# Patient Record
Sex: Female | Born: 1945
Health system: Southern US, Community
[De-identification: ages and names within clinical notes are randomized; demographics above are authoritative.]

## PROBLEM LIST (undated history)

## (undated) DIAGNOSIS — K649 Unspecified hemorrhoids: Secondary | ICD-10-CM

## (undated) DIAGNOSIS — C801 Malignant (primary) neoplasm, unspecified: Secondary | ICD-10-CM

## (undated) DIAGNOSIS — E119 Type 2 diabetes mellitus without complications: Secondary | ICD-10-CM

## (undated) DIAGNOSIS — C3490 Malignant neoplasm of unspecified part of unspecified bronchus or lung: Secondary | ICD-10-CM

## (undated) DIAGNOSIS — I639 Cerebral infarction, unspecified: Secondary | ICD-10-CM

## (undated) DIAGNOSIS — C7951 Secondary malignant neoplasm of bone: Secondary | ICD-10-CM

## (undated) DIAGNOSIS — Z780 Asymptomatic menopausal state: Secondary | ICD-10-CM

## (undated) DIAGNOSIS — E785 Hyperlipidemia, unspecified: Secondary | ICD-10-CM

## (undated) DIAGNOSIS — D126 Benign neoplasm of colon, unspecified: Secondary | ICD-10-CM

## (undated) DIAGNOSIS — E669 Obesity, unspecified: Secondary | ICD-10-CM

## (undated) DIAGNOSIS — M1990 Unspecified osteoarthritis, unspecified site: Secondary | ICD-10-CM

## (undated) HISTORY — DX: Obesity, unspecified: E66.9

## (undated) HISTORY — DX: Benign neoplasm of colon, unspecified: D12.6

## (undated) HISTORY — DX: Asymptomatic menopausal state: Z78.0

## (undated) HISTORY — DX: Unspecified hemorrhoids: K64.9

## (undated) HISTORY — DX: Hyperlipidemia, unspecified: E78.5

## (undated) HISTORY — DX: Unspecified osteoarthritis, unspecified site: M19.90

## (undated) HISTORY — DX: Type 2 diabetes mellitus without complications: E11.9

---

## 1988-01-09 HISTORY — PX: HYSTERECTOMY ABDOMINAL WITH SALPINGO-OOPHORECTOMY: SHX6792

## 2001-08-12 ENCOUNTER — Encounter: Payer: Self-pay | Admitting: Family Medicine

## 2001-08-12 ENCOUNTER — Encounter: Admission: RE | Admit: 2001-08-12 | Discharge: 2001-08-12 | Payer: Self-pay | Admitting: Family Medicine

## 2001-10-09 ENCOUNTER — Ambulatory Visit (HOSPITAL_COMMUNITY): Admission: RE | Admit: 2001-10-09 | Discharge: 2001-10-09 | Payer: Self-pay | Admitting: Gastroenterology

## 2001-10-09 ENCOUNTER — Encounter (INDEPENDENT_AMBULATORY_CARE_PROVIDER_SITE_OTHER): Payer: Self-pay | Admitting: Specialist

## 2002-08-10 ENCOUNTER — Encounter: Payer: Self-pay | Admitting: Cardiovascular Disease

## 2002-08-19 ENCOUNTER — Ambulatory Visit (HOSPITAL_COMMUNITY): Admission: RE | Admit: 2002-08-19 | Discharge: 2002-08-19 | Payer: Self-pay | Admitting: Cardiovascular Disease

## 2002-09-01 ENCOUNTER — Encounter: Admission: RE | Admit: 2002-09-01 | Discharge: 2002-11-30 | Payer: Self-pay | Admitting: Family Medicine

## 2003-01-09 HISTORY — PX: SHOULDER SURGERY: SHX246

## 2003-05-28 ENCOUNTER — Emergency Department (HOSPITAL_COMMUNITY): Admission: EM | Admit: 2003-05-28 | Discharge: 2003-05-28 | Payer: Self-pay | Admitting: Emergency Medicine

## 2003-10-12 ENCOUNTER — Ambulatory Visit (HOSPITAL_COMMUNITY): Admission: RE | Admit: 2003-10-12 | Discharge: 2003-10-14 | Payer: Self-pay | Admitting: Orthopedic Surgery

## 2004-09-29 ENCOUNTER — Other Ambulatory Visit: Admission: RE | Admit: 2004-09-29 | Discharge: 2004-09-29 | Payer: Self-pay | Admitting: Family Medicine

## 2005-03-08 ENCOUNTER — Encounter: Admission: RE | Admit: 2005-03-08 | Discharge: 2005-03-08 | Payer: Self-pay | Admitting: Family Medicine

## 2006-04-03 ENCOUNTER — Encounter: Admission: RE | Admit: 2006-04-03 | Discharge: 2006-04-03 | Payer: Self-pay | Admitting: Family Medicine

## 2006-12-12 ENCOUNTER — Encounter: Admission: RE | Admit: 2006-12-12 | Discharge: 2006-12-12 | Payer: Self-pay | Admitting: Family Medicine

## 2007-01-09 HISTORY — PX: KNEE ARTHROSCOPY: SUR90

## 2007-02-13 ENCOUNTER — Encounter: Admission: RE | Admit: 2007-02-13 | Discharge: 2007-02-13 | Payer: Self-pay | Admitting: Family Medicine

## 2007-03-19 ENCOUNTER — Other Ambulatory Visit: Admission: RE | Admit: 2007-03-19 | Discharge: 2007-03-19 | Payer: Self-pay | Admitting: Family Medicine

## 2007-04-04 ENCOUNTER — Encounter: Admission: RE | Admit: 2007-04-04 | Discharge: 2007-04-04 | Payer: Self-pay | Admitting: Family Medicine

## 2008-05-06 ENCOUNTER — Encounter: Admission: RE | Admit: 2008-05-06 | Discharge: 2008-05-06 | Payer: Self-pay | Admitting: Family Medicine

## 2008-10-29 ENCOUNTER — Inpatient Hospital Stay (HOSPITAL_COMMUNITY): Admission: EM | Admit: 2008-10-29 | Discharge: 2008-10-30 | Payer: Self-pay | Admitting: Emergency Medicine

## 2009-05-09 ENCOUNTER — Encounter: Admission: RE | Admit: 2009-05-09 | Discharge: 2009-05-09 | Payer: Self-pay | Admitting: Family Medicine

## 2010-01-06 ENCOUNTER — Inpatient Hospital Stay (HOSPITAL_COMMUNITY)
Admission: EM | Admit: 2010-01-06 | Discharge: 2010-01-10 | Disposition: A | Discharge status: Rehabilitation Facility | Payer: Self-pay | Source: Home / Self Care | Attending: Internal Medicine | Admitting: Internal Medicine

## 2010-01-07 ENCOUNTER — Encounter (INDEPENDENT_AMBULATORY_CARE_PROVIDER_SITE_OTHER): Payer: Self-pay | Admitting: Internal Medicine

## 2010-01-09 ENCOUNTER — Encounter (INDEPENDENT_AMBULATORY_CARE_PROVIDER_SITE_OTHER): Payer: Self-pay | Admitting: Family Medicine

## 2010-01-10 ENCOUNTER — Inpatient Hospital Stay (HOSPITAL_COMMUNITY)
Admission: RE | Admit: 2010-01-10 | Discharge: 2010-01-19 | Payer: Self-pay | Attending: Physical Medicine & Rehabilitation | Admitting: Physical Medicine & Rehabilitation

## 2010-01-10 ENCOUNTER — Other Ambulatory Visit: Payer: Self-pay | Admitting: Family Medicine

## 2010-01-11 LAB — CBC
HCT: 36.5 % (ref 36.0–46.0)
Hemoglobin: 11.9 g/dL — ABNORMAL LOW (ref 12.0–15.0)
MCH: 30.1 pg (ref 26.0–34.0)
MCHC: 32.6 g/dL (ref 30.0–36.0)
MCV: 92.4 fL (ref 78.0–100.0)
Platelets: 227 10*3/uL (ref 150–400)
RBC: 3.95 MIL/uL (ref 3.87–5.11)
RDW: 13 % (ref 11.5–15.5)
WBC: 5.2 10*3/uL (ref 4.0–10.5)

## 2010-01-11 LAB — GLUCOSE, CAPILLARY
Glucose-Capillary: 134 mg/dL — ABNORMAL HIGH (ref 70–99)
Glucose-Capillary: 152 mg/dL — ABNORMAL HIGH (ref 70–99)
Glucose-Capillary: 139 mg/dL — ABNORMAL HIGH (ref 70–99)
Glucose-Capillary: 260 mg/dL — ABNORMAL HIGH (ref 70–99)

## 2010-01-11 LAB — COMPREHENSIVE METABOLIC PANEL
ALT: 11 U/L (ref 0–35)
AST: 16 U/L (ref 0–37)
Albumin: 3.7 g/dL (ref 3.5–5.2)
Alkaline Phosphatase: 51 U/L (ref 39–117)
BUN: 14 mg/dL (ref 6–23)
CO2: 26 mEq/L (ref 19–32)
Calcium: 9.3 mg/dL (ref 8.4–10.5)
Chloride: 107 mEq/L (ref 96–112)
Creatinine, Ser: 0.78 mg/dL (ref 0.4–1.2)
GFR calc Af Amer: >60 mL/min (ref >60)
GFR calc non Af Amer: >60 mL/min (ref >60)
Glucose, Bld: 136 mg/dL — ABNORMAL HIGH (ref 70–99)
Potassium: 4.2 mEq/L (ref 3.5–5.1)
Sodium: 140 mEq/L (ref 135–145)
Total Bilirubin: 0.3 mg/dL (ref 0.3–1.2)
Total Protein: 6.5 g/dL (ref 6.0–8.3)

## 2010-01-12 LAB — GLUCOSE, CAPILLARY
Glucose-Capillary: 180 mg/dL — ABNORMAL HIGH (ref 70–99)
Glucose-Capillary: 133 mg/dL — ABNORMAL HIGH (ref 70–99)
Glucose-Capillary: 153 mg/dL — ABNORMAL HIGH (ref 70–99)
Glucose-Capillary: 193 mg/dL — ABNORMAL HIGH (ref 70–99)

## 2010-01-12 LAB — DIFFERENTIAL
Basophils Absolute: 0 10*3/uL (ref 0.0–0.1)
Basophils Relative: 0 % (ref 0–1)
Eosinophils Absolute: 0.1 10*3/uL (ref 0.0–0.7)
Eosinophils Relative: 3 % (ref 0–5)
Lymphocytes Relative: 39 % (ref 12–46)
Lymphs Abs: 2 10*3/uL (ref 0.7–4.0)
Monocytes Absolute: 0.4 10*3/uL (ref 0.1–1.0)
Monocytes Relative: 8 % (ref 3–12)
Neutro Abs: 2.6 10*3/uL (ref 1.7–7.7)
Neutrophils Relative %: 50 % (ref 43–77)

## 2010-01-13 LAB — GLUCOSE, CAPILLARY
Glucose-Capillary: 128 mg/dL — ABNORMAL HIGH (ref 70–99)
Glucose-Capillary: 91 mg/dL (ref 70–99)

## 2010-01-23 LAB — GLUCOSE, CAPILLARY
Glucose-Capillary: 263 mg/dL — ABNORMAL HIGH (ref 70–99)
Glucose-Capillary: 139 mg/dL — ABNORMAL HIGH (ref 70–99)
Glucose-Capillary: 251 mg/dL — ABNORMAL HIGH (ref 70–99)
Glucose-Capillary: 239 mg/dL — ABNORMAL HIGH (ref 70–99)
Glucose-Capillary: 158 mg/dL — ABNORMAL HIGH (ref 70–99)
Glucose-Capillary: 191 mg/dL — ABNORMAL HIGH (ref 70–99)
Glucose-Capillary: 148 mg/dL — ABNORMAL HIGH (ref 70–99)
Glucose-Capillary: 151 mg/dL — ABNORMAL HIGH (ref 70–99)
Glucose-Capillary: 86 mg/dL (ref 70–99)
Glucose-Capillary: 131 mg/dL — ABNORMAL HIGH (ref 70–99)
Glucose-Capillary: 128 mg/dL — ABNORMAL HIGH (ref 70–99)
Glucose-Capillary: 129 mg/dL — ABNORMAL HIGH (ref 70–99)
Glucose-Capillary: 107 mg/dL — ABNORMAL HIGH (ref 70–99)
Glucose-Capillary: 125 mg/dL — ABNORMAL HIGH (ref 70–99)
Glucose-Capillary: 170 mg/dL — ABNORMAL HIGH (ref 70–99)
Glucose-Capillary: 119 mg/dL — ABNORMAL HIGH (ref 70–99)
Glucose-Capillary: 172 mg/dL — ABNORMAL HIGH (ref 70–99)
Glucose-Capillary: 114 mg/dL — ABNORMAL HIGH (ref 70–99)
Glucose-Capillary: 127 mg/dL — ABNORMAL HIGH (ref 70–99)
Glucose-Capillary: 123 mg/dL — ABNORMAL HIGH (ref 70–99)
Glucose-Capillary: 115 mg/dL — ABNORMAL HIGH (ref 70–99)
Glucose-Capillary: 101 mg/dL — ABNORMAL HIGH (ref 70–99)
Glucose-Capillary: 213 mg/dL — ABNORMAL HIGH (ref 70–99)
Glucose-Capillary: 141 mg/dL — ABNORMAL HIGH (ref 70–99)

## 2010-01-30 ENCOUNTER — Encounter
Admission: RE | Admit: 2010-01-30 | Discharge: 2010-02-07 | Payer: Self-pay | Source: Home / Self Care | Attending: Physical Medicine & Rehabilitation | Admitting: Physical Medicine & Rehabilitation

## 2010-02-08 ENCOUNTER — Ambulatory Visit: Payer: Medicare Other | Attending: Physical Medicine & Rehabilitation | Admitting: Occupational Therapy

## 2010-02-08 ENCOUNTER — Ambulatory Visit: Payer: Medicare Other | Admitting: Physical Therapy

## 2010-02-08 DIAGNOSIS — R279 Unspecified lack of coordination: Secondary | ICD-10-CM | POA: Insufficient documentation

## 2010-02-08 DIAGNOSIS — Z5189 Encounter for other specified aftercare: Secondary | ICD-10-CM | POA: Insufficient documentation

## 2010-02-08 DIAGNOSIS — R269 Unspecified abnormalities of gait and mobility: Secondary | ICD-10-CM | POA: Insufficient documentation

## 2010-02-08 DIAGNOSIS — I69998 Other sequelae following unspecified cerebrovascular disease: Secondary | ICD-10-CM | POA: Insufficient documentation

## 2010-02-08 DIAGNOSIS — M6281 Muscle weakness (generalized): Secondary | ICD-10-CM | POA: Insufficient documentation

## 2010-02-10 ENCOUNTER — Ambulatory Visit: Payer: Medicare Other | Admitting: Physical Therapy

## 2010-02-10 ENCOUNTER — Ambulatory Visit: Payer: Medicare Other | Admitting: Occupational Therapy

## 2010-02-13 ENCOUNTER — Ambulatory Visit: Payer: Medicare Other | Admitting: Physical Therapy

## 2010-02-13 ENCOUNTER — Ambulatory Visit: Payer: Medicare Other | Admitting: Occupational Therapy

## 2010-02-15 ENCOUNTER — Ambulatory Visit: Payer: Medicare Other | Admitting: Physical Therapy

## 2010-02-15 ENCOUNTER — Ambulatory Visit: Payer: Medicare Other | Admitting: Occupational Therapy

## 2010-02-17 ENCOUNTER — Encounter: Payer: Medicare Other | Attending: Physical Medicine & Rehabilitation

## 2010-02-17 ENCOUNTER — Inpatient Hospital Stay: Payer: Medicare Other | Admitting: Physical Medicine & Rehabilitation

## 2010-02-17 DIAGNOSIS — E119 Type 2 diabetes mellitus without complications: Secondary | ICD-10-CM | POA: Insufficient documentation

## 2010-02-17 DIAGNOSIS — I6330 Cerebral infarction due to thrombosis of unspecified cerebral artery: Secondary | ICD-10-CM

## 2010-02-17 DIAGNOSIS — I69993 Ataxia following unspecified cerebrovascular disease: Secondary | ICD-10-CM | POA: Insufficient documentation

## 2010-02-17 DIAGNOSIS — R209 Unspecified disturbances of skin sensation: Secondary | ICD-10-CM

## 2010-02-17 DIAGNOSIS — G8110 Spastic hemiplegia affecting unspecified side: Secondary | ICD-10-CM

## 2010-02-17 DIAGNOSIS — R279 Unspecified lack of coordination: Secondary | ICD-10-CM | POA: Insufficient documentation

## 2010-02-17 DIAGNOSIS — I69959 Hemiplegia and hemiparesis following unspecified cerebrovascular disease affecting unspecified side: Secondary | ICD-10-CM | POA: Insufficient documentation

## 2010-02-17 DIAGNOSIS — I10 Essential (primary) hypertension: Secondary | ICD-10-CM | POA: Insufficient documentation

## 2010-02-17 DIAGNOSIS — R29898 Other symptoms and signs involving the musculoskeletal system: Secondary | ICD-10-CM | POA: Insufficient documentation

## 2010-02-17 DIAGNOSIS — I69998 Other sequelae following unspecified cerebrovascular disease: Secondary | ICD-10-CM | POA: Insufficient documentation

## 2010-02-21 ENCOUNTER — Ambulatory Visit: Payer: Medicare Other | Admitting: Occupational Therapy

## 2010-02-21 ENCOUNTER — Ambulatory Visit: Payer: Medicare Other | Admitting: Physical Therapy

## 2010-02-24 ENCOUNTER — Ambulatory Visit: Payer: Medicare Other | Admitting: Occupational Therapy

## 2010-02-24 ENCOUNTER — Ambulatory Visit: Payer: Medicare Other | Admitting: Physical Therapy

## 2010-02-27 ENCOUNTER — Ambulatory Visit: Payer: Medicare Other | Admitting: Physical Therapy

## 2010-02-27 ENCOUNTER — Ambulatory Visit: Payer: Medicare Other | Admitting: Occupational Therapy

## 2010-03-01 ENCOUNTER — Ambulatory Visit: Payer: Medicare Other | Admitting: Physical Therapy

## 2010-03-01 ENCOUNTER — Ambulatory Visit: Payer: Medicare Other | Admitting: Occupational Therapy

## 2010-03-20 LAB — URINALYSIS, MICROSCOPIC ONLY
Bilirubin Urine: NEGATIVE
Glucose, UA: NEGATIVE mg/dL
Hgb urine dipstick: NEGATIVE
Ketones, ur: NEGATIVE mg/dL
Nitrite: NEGATIVE
Protein, ur: NEGATIVE mg/dL
Specific Gravity, Urine: 1.024 (ref 1.005–1.030)
Urobilinogen, UA: 0.2 mg/dL (ref 0.0–1.0)
pH: 5.5 (ref 5.0–8.0)

## 2010-03-20 LAB — CARDIAC PANEL(CRET KIN+CKTOT+MB+TROPI)
CK, MB: 1.1 ng/mL (ref 0.3–4.0)
CK, MB: 1.1 ng/mL (ref 0.3–4.0)
Total CK: 55 U/L (ref 7–177)
Total CK: 62 U/L (ref 7–177)
Troponin I: 0.01 ng/mL (ref 0.00–0.06)

## 2010-03-20 LAB — CBC
MCHC: 33.8 g/dL (ref 30.0–36.0)
MCV: 91.8 fL (ref 78.0–100.0)
RBC: 3.8 MIL/uL — ABNORMAL LOW (ref 3.87–5.11)
RDW: 12.5 % (ref 11.5–15.5)
WBC: 8.2 10*3/uL (ref 4.0–10.5)

## 2010-03-20 LAB — HEMOGLOBIN A1C
Hgb A1c MFr Bld: 9 % — ABNORMAL HIGH (ref <5.7)
Mean Plasma Glucose: 212 mg/dL — ABNORMAL HIGH (ref <117)

## 2010-03-20 LAB — URINALYSIS, ROUTINE W REFLEX MICROSCOPIC
Bilirubin Urine: NEGATIVE
Glucose, UA: NEGATIVE mg/dL
Hgb urine dipstick: NEGATIVE
Nitrite: NEGATIVE
pH: 5.5 (ref 5.0–8.0)

## 2010-03-20 LAB — GLUCOSE, CAPILLARY
Glucose-Capillary: 132 mg/dL — ABNORMAL HIGH (ref 70–99)
Glucose-Capillary: 159 mg/dL — ABNORMAL HIGH (ref 70–99)
Glucose-Capillary: 107 mg/dL — ABNORMAL HIGH (ref 70–99)
Glucose-Capillary: 111 mg/dL — ABNORMAL HIGH (ref 70–99)
Glucose-Capillary: 114 mg/dL — ABNORMAL HIGH (ref 70–99)
Glucose-Capillary: 126 mg/dL — ABNORMAL HIGH (ref 70–99)
Glucose-Capillary: 142 mg/dL — ABNORMAL HIGH (ref 70–99)
Glucose-Capillary: 158 mg/dL — ABNORMAL HIGH (ref 70–99)
Glucose-Capillary: 164 mg/dL — ABNORMAL HIGH (ref 70–99)
Glucose-Capillary: 187 mg/dL — ABNORMAL HIGH (ref 70–99)

## 2010-03-20 LAB — URINE CULTURE
Colony Count: >=100000
Culture  Setup Time: 201201031820

## 2010-03-20 LAB — COMPREHENSIVE METABOLIC PANEL
BUN: 14 mg/dL (ref 6–23)
Calcium: 10 mg/dL (ref 8.4–10.5)
Chloride: 103 mEq/L (ref 96–112)
Glucose, Bld: 222 mg/dL — ABNORMAL HIGH (ref 70–99)
Potassium: 4.2 mEq/L (ref 3.5–5.1)

## 2010-03-20 LAB — DIFFERENTIAL
Basophils Absolute: 0 10*3/uL (ref 0.0–0.1)
Monocytes Absolute: 0.5 10*3/uL (ref 0.1–1.0)
Monocytes Relative: 6 % (ref 3–12)

## 2010-03-20 LAB — LIPID PANEL
HDL: 32 mg/dL — ABNORMAL LOW (ref >39)
LDL Cholesterol: 75 mg/dL (ref 0–99)
Total CHOL/HDL Ratio: 4.6 RATIO
Triglycerides: 194 mg/dL — ABNORMAL HIGH (ref <150)

## 2010-03-20 LAB — APTT: aPTT: 32 seconds (ref 24–37)

## 2010-03-20 LAB — CK TOTAL AND CKMB (NOT AT ARMC): CK, MB: 1.4 ng/mL (ref 0.3–4.0)

## 2010-03-20 LAB — PROTIME-INR: Prothrombin Time: 13.7 seconds (ref 11.6–15.2)

## 2010-03-21 ENCOUNTER — Encounter: Payer: Medicare Other | Attending: Physical Medicine & Rehabilitation

## 2010-03-21 ENCOUNTER — Ambulatory Visit: Payer: Medicare Other | Admitting: Physical Medicine & Rehabilitation

## 2010-03-21 DIAGNOSIS — G5610 Other lesions of median nerve, unspecified upper limb: Secondary | ICD-10-CM

## 2010-03-21 DIAGNOSIS — R209 Unspecified disturbances of skin sensation: Secondary | ICD-10-CM | POA: Insufficient documentation

## 2010-03-21 DIAGNOSIS — I69998 Other sequelae following unspecified cerebrovascular disease: Secondary | ICD-10-CM | POA: Insufficient documentation

## 2010-03-21 DIAGNOSIS — E119 Type 2 diabetes mellitus without complications: Secondary | ICD-10-CM | POA: Insufficient documentation

## 2010-03-21 DIAGNOSIS — I69959 Hemiplegia and hemiparesis following unspecified cerebrovascular disease affecting unspecified side: Secondary | ICD-10-CM | POA: Insufficient documentation

## 2010-03-21 DIAGNOSIS — I69993 Ataxia following unspecified cerebrovascular disease: Secondary | ICD-10-CM | POA: Insufficient documentation

## 2010-03-21 DIAGNOSIS — R29898 Other symptoms and signs involving the musculoskeletal system: Secondary | ICD-10-CM | POA: Insufficient documentation

## 2010-03-21 DIAGNOSIS — R279 Unspecified lack of coordination: Secondary | ICD-10-CM | POA: Insufficient documentation

## 2010-03-21 DIAGNOSIS — I10 Essential (primary) hypertension: Secondary | ICD-10-CM | POA: Insufficient documentation

## 2010-04-13 LAB — COMPREHENSIVE METABOLIC PANEL
ALT: 22 U/L (ref 0–35)
Albumin: 4 g/dL (ref 3.5–5.2)
Alkaline Phosphatase: 75 U/L (ref 39–117)
BUN: 11 mg/dL (ref 6–23)
Chloride: 96 mEq/L (ref 96–112)
Glucose, Bld: 236 mg/dL — ABNORMAL HIGH (ref 70–99)
Potassium: 3.6 mEq/L (ref 3.5–5.1)
Sodium: 131 mEq/L — ABNORMAL LOW (ref 135–145)
Total Bilirubin: 0.8 mg/dL (ref 0.3–1.2)
Total Protein: 7.1 g/dL (ref 6.0–8.3)

## 2010-04-13 LAB — CK TOTAL AND CKMB (NOT AT ARMC): CK, MB: 1.2 ng/mL (ref 0.3–4.0)

## 2010-04-13 LAB — DIFFERENTIAL
Lymphs Abs: 1.9 10*3/uL (ref 0.7–4.0)
Monocytes Relative: 6 % (ref 3–12)
Neutro Abs: 3.3 10*3/uL (ref 1.7–7.7)
Neutrophils Relative %: 59 % (ref 43–77)

## 2010-04-13 LAB — CARDIAC PANEL(CRET KIN+CKTOT+MB+TROPI)
CK, MB: 1.3 ng/mL (ref 0.3–4.0)
Total CK: 80 U/L (ref 7–177)

## 2010-04-13 LAB — POCT I-STAT, CHEM 8
BUN: 11 mg/dL (ref 6–23)
Chloride: 104 mEq/L (ref 96–112)
Creatinine, Ser: 0.5 mg/dL (ref 0.4–1.2)
Glucose, Bld: 267 mg/dL — ABNORMAL HIGH (ref 70–99)
Potassium: 4.1 mEq/L (ref 3.5–5.1)

## 2010-04-13 LAB — POCT CARDIAC MARKERS
CKMB, poc: 1.2 ng/mL (ref 1.0–8.0)
CKMB, poc: <1 ng/mL — ABNORMAL LOW (ref 1.0–8.0)
Myoglobin, poc: 59.2 ng/mL (ref 12–200)
Troponin i, poc: <0.05 ng/mL (ref 0.00–0.09)

## 2010-04-13 LAB — MAGNESIUM: Magnesium: 1.8 mg/dL (ref 1.5–2.5)

## 2010-04-13 LAB — CBC
HCT: 38.5 % (ref 36.0–46.0)
Hemoglobin: 12.9 g/dL (ref 12.0–15.0)
Hemoglobin: 13.5 g/dL (ref 12.0–15.0)
Platelets: 239 10*3/uL (ref 150–400)
RBC: 3.99 MIL/uL (ref 3.87–5.11)
RDW: 12.5 % (ref 11.5–15.5)
WBC: 5.7 10*3/uL (ref 4.0–10.5)
WBC: 6.3 10*3/uL (ref 4.0–10.5)

## 2010-04-13 LAB — HEMOGLOBIN A1C: Hgb A1c MFr Bld: 11.3 % — ABNORMAL HIGH (ref 4.6–6.1)

## 2010-04-13 LAB — BRAIN NATRIURETIC PEPTIDE: Pro B Natriuretic peptide (BNP): <30 pg/mL (ref 0.0–100.0)

## 2010-04-13 LAB — LIPID PANEL
LDL Cholesterol: UNDETERMINED mg/dL (ref 0–99)
Triglycerides: 561 mg/dL — ABNORMAL HIGH (ref <150)
VLDL: UNDETERMINED mg/dL (ref 0–40)

## 2010-04-13 LAB — GLUCOSE, CAPILLARY
Glucose-Capillary: 177 mg/dL — ABNORMAL HIGH (ref 70–99)
Glucose-Capillary: 237 mg/dL — ABNORMAL HIGH (ref 70–99)

## 2010-04-14 ENCOUNTER — Other Ambulatory Visit: Payer: Self-pay | Admitting: Family Medicine

## 2010-04-14 DIAGNOSIS — Z1231 Encounter for screening mammogram for malignant neoplasm of breast: Secondary | ICD-10-CM

## 2010-04-21 ENCOUNTER — Encounter: Payer: Medicare Other | Attending: Physical Medicine & Rehabilitation

## 2010-04-21 ENCOUNTER — Ambulatory Visit: Payer: Medicare Other | Admitting: Physical Medicine & Rehabilitation

## 2010-04-21 DIAGNOSIS — R279 Unspecified lack of coordination: Secondary | ICD-10-CM | POA: Insufficient documentation

## 2010-04-21 DIAGNOSIS — I69959 Hemiplegia and hemiparesis following unspecified cerebrovascular disease affecting unspecified side: Secondary | ICD-10-CM | POA: Insufficient documentation

## 2010-04-21 DIAGNOSIS — R29898 Other symptoms and signs involving the musculoskeletal system: Secondary | ICD-10-CM | POA: Insufficient documentation

## 2010-04-21 DIAGNOSIS — I69993 Ataxia following unspecified cerebrovascular disease: Secondary | ICD-10-CM | POA: Insufficient documentation

## 2010-04-21 DIAGNOSIS — G5610 Other lesions of median nerve, unspecified upper limb: Secondary | ICD-10-CM

## 2010-04-21 DIAGNOSIS — I69998 Other sequelae following unspecified cerebrovascular disease: Secondary | ICD-10-CM | POA: Insufficient documentation

## 2010-04-21 DIAGNOSIS — R209 Unspecified disturbances of skin sensation: Secondary | ICD-10-CM | POA: Insufficient documentation

## 2010-04-21 DIAGNOSIS — I6330 Cerebral infarction due to thrombosis of unspecified cerebral artery: Secondary | ICD-10-CM

## 2010-04-21 DIAGNOSIS — M19029 Primary osteoarthritis, unspecified elbow: Secondary | ICD-10-CM

## 2010-04-21 DIAGNOSIS — E119 Type 2 diabetes mellitus without complications: Secondary | ICD-10-CM | POA: Insufficient documentation

## 2010-04-21 DIAGNOSIS — I10 Essential (primary) hypertension: Secondary | ICD-10-CM | POA: Insufficient documentation

## 2010-04-21 NOTE — Assessment & Plan Note (Signed)
REASON FOR VISIT:  Pain in right shoulder, weakness in right side from stroke, and numbness in hands.  HISTORY OF PRESENT ILLNESS:  This is a 65 year old female with prior left pontine stroke causing right hemiparesis onset January 07, 2010. She was an inpatient at [facility] from January 10, 2009, to January 19, 2009, went through the outpatient neuro rehab program for PT and OT. She has done relatively well in terms of her stroke.  She is back into an independent lifestyle.  She was complaining of numbness in the hands waking her up at night.  She underwent EMG and CV showing a moderate median nerve compressive neuropathy at the wrist bilaterally.  She was advised wrist splints and these symptoms have improved.  Also noted was an ulnar neuropathy at the elbow bilaterally, but this appeared to be mild rather than moderate.  She still has a right shoulder pain, sees Dr. Dorene Grebe for this.  She has had no falls.  No other medical complications in the interval time. We discussed recommendations against NSAIDS due to history of stroke.  SOCIAL HISTORY:  Married, lives with her spouse.  PHYSICAL EXAMINATION:  Blood pressure 120/75, pulse 82, respirations 18, and O2 sat 99% on room air.  Orientation x3.  Affect alert.  Gait is normal.  No evidence of toe drag or knee instability.  Phalen's is negative bilaterally.  Tinel's negative at the wrist and elbow bilaterally.  Sensation mildly reduced at right index finger bilaterally compared to the fifth digits.  Shoulder has pain over the [facility] area with adduction.  IMPRESSION: 1. Cerebrovascular accident of left pontine with essential resolution     of right hemiparesis. 2. Median neuropathy of the wrist, improved after wrist splinting. 3. Right shoulder pain, probable acromioclavicular joint versus     subacromial bursitis.  PLAN: 1. Wrist splints p.r.n. hand tingling.  She is no longer wearing them     at the current time. 2. Dr. August Saucer  is to address shoulder pain as he has done. 3. I will see the patient back in 2 months.  She has also been     reminded to keep up with her home exercise program for her stroke     rehabilitation exercises.     Erick Colace, M.D. Electronically Signed    AEK/MedQ D:  04/21/2010 09:47:02  T:  04/21/2010 23:24:37  Job #:  [id number]  cc:   G. Dorene Grebe, M.D. Fax: [phone]

## 2010-05-11 ENCOUNTER — Ambulatory Visit
Admission: RE | Admit: 2010-05-11 | Discharge: 2010-05-11 | Disposition: A | Discharge status: Home / Self Care | Payer: Medicare Other | Source: Ambulatory Visit | Attending: Family Medicine | Admitting: Family Medicine

## 2010-05-11 DIAGNOSIS — Z1231 Encounter for screening mammogram for malignant neoplasm of breast: Secondary | ICD-10-CM

## 2010-05-26 NOTE — Cardiovascular Report (Signed)
   NAME:  Elizabeth Calhoun, Elizabeth Calhoun                      ACCOUNT NO.:  [id number]   MEDICAL RECORD NO.:  [id number]                   PATIENT TYPE:  OIB   LOCATION:  2899                                 FACILITY:  [facility]   PHYSICIAN:  Vesta Mixer, M.D.              DATE OF BIRTH:  1945-04-26   DATE OF PROCEDURE:  08/19/2002  DATE OF DISCHARGE:                              CARDIAC CATHETERIZATION   PROCEDURE:  Left heart catheterization with coronary angiography.   INDICATIONS:  Ms. Hukill is a 65 year old female with a history of chest  pains.  She was referred for a stress Cardiolite study which revealed an  anterior apical defect.  She was scheduled for a heart catheterization for  further evaluation.   PROCEDURE:  The right femoral artery was easily cannulated using a modified  Seldinger technique.   HEMODYNAMICS:  The left ventricular pressure was 151/10 with an aortic  pressure of 150/77.   ANGIOGRAPHY:  The left main coronary artery is smooth and normal.   The left anterior descending artery has minor luminal irregularities.  The  first diagonal vessel is a fairly large vessel with a 10-20% stenosis in the  proximal aspect.   The left circumflex artery is a moderate sized vessel and is normal.   The right coronary artery is large and dominant.  There are minor luminal  irregularities in the proximal and mid segment.  The distal RCA has a focal  20-25% stenosis.  The posterior descending artery and the posterolateral  segment arteries are normal.   LEFT VENTRICULOGRAM:  The left ventriculogram was performed in a 30 RAO  position.  It reveals normal left ventricular systolic function with an  ejection fraction of 70%.  There is no mitral regurgitation.   COMPLICATIONS:  None.    CONCLUSION:  1. Minimal coronary artery irregularities.  I suspect that her episodes of     chest pain are noncardiac in origin.  2. Hypertension.  She is currently on no medical therapy for  this, although     she may need to be started on some antihypertensives in the near future.     We will follow up with this as an outpatient.                                               Vesta Mixer, M.D.    PJN/MEDQ  D:  08/19/2002  T:  08/19/2002  Job:  [id number]   cc:   Chales Salmon. Abigail Miyamoto, M.D.  [address]  [place]  [place] [address]  Fax: [phone]

## 2010-05-26 NOTE — Op Note (Signed)
NAME:  Elizabeth Calhoun, Elizabeth Calhoun            ACCOUNT NO.:  [id number]   MEDICAL RECORD NO.:  [id number]          PATIENT TYPE:  OIB   LOCATION:  5029                         FACILITY:  [facility]   PHYSICIAN:  Burnard Bunting, M.D.    DATE OF BIRTH:  1945-03-08   DATE OF PROCEDURE:  10/12/2003  DATE OF DISCHARGE:                                 OPERATIVE REPORT   PREOPERATIVE DIAGNOSIS:  Left shoulder flap tear with biceps degeneration,  synovitis, rotator cuff tear, and impingement bursitis.   POSTOPERATIVE DIAGNOSIS:  Left shoulder flap tear with biceps degeneration,  synovitis, rotator cuff tear, and impingement bursitis.   PROCEDURE:  Left shoulder diagnostic operative arthroscopy with extensive  debridement of synovitis and labral degeneration, with release of  degenerated biceps tendon and subsequent biceps tenodesis, subacromial  decompression, and mini open rotator cuff repair.   SURGEON:  Burnard Bunting, M.D.   ASSISTANT:  Jerolyn Shin. Tresa Res, M.D..   ANESTHESIA:  General endotracheal plus interscalene block.   ESTIMATED BLOOD LOSS:  25 cc.   DRAINS:  None.   OPERATIVE FINDINGS:  1.  Examination under anesthesia.  Range of motion:  External rotation 50      degrees, abduction is 70 degrees, forward flexion is 180, internal      rotation 90 degrees, abduction 60, external rotation 90 degrees,      abduction is about 100.  Shoulder stability 1+ anterior-posterior, with      less than 1 cm __________.  2.  Diagnostic operative arthroscopy.  3.  Completely frayed and degenerated biceps tendon, with approximately 75%      tearing of the biceps tendon, with early adhesive capsulitis type      synovitis within the biceps anchor and rotator interval.  4.  Intact glenohumeral articular surface.  5.  A 1.5 x 2 cm rotator cuff tear, leading edge of the supraspinatus.  6.  Significant impingement bursitis.  7.  Os acromionale.   PROCEDURE IN DETAIL:  The patient was brought to the operating  room, where  general endotracheal anesthesia was induced.  Preoperative antibiotics had  been administered.  The patient was placed in the beach-chair position, with  the head in neutral position and the right arm well padded.  The left arm,  shoulder, and hand were prepped with Duraprep solution and draped in a  sterile manner.  Topographic anatomy of the shoulder was identified,  including the posterolateral and anterior margins of the acromion, as well  as the location of the [facility] joint and coracoid process.  Twenty cubic  centimeters of saline were then placed into the glenohumeral joint.  Twenty  cubic centimeters of saline and epinephrine solution were then placed into  the subacromial space.  A posterior portal was then created 2 cm medial and  inferior to the posterolateral margin of the acromion.  An anterior portal  was then created under direct visualization.  Diagnostic arthroscopy of the  glenohumeral joint was performed.  Glenohumeral articular surfaces were  intact.  A significant rotator cuff tear was identified which was  approximately 1.5-2 cm x 2 cm.  The biceps tendon itself was significantly  frayed, with extensive synovitis noted within the superior labral region as  well as rotator interval region.  Biceps tendon was released with the  shaver.  Extensive synovitis was extensively debrided.  No loose bodies were  present in the axillary recess.  Infraspinatus attachment was intact.  At  this time, the lateral portal was established, and the scope was placed into  the subacromial space.  Bursectomy and release of the coracoacromial  ligament were performed.  Small spur was resected, although minimal bone  resection was performed due to the patient's os acromionale.  At this time,  instruments were removed from the portals.  The anterior and posterior  portals were closed using 3-0 nylon suture.  Operative field was covered  with Ioban.  An incision was then made along the  shoulder.  Skin and  subcutaneous tissues were sharply divided.  A stay suture was placed in the  middle of the anterior and middle raphes of the deltoid, 3 cm distal to the  anterolateral margin of the acromion.  The deltoid was then split but not  detached.  Rotator cuff tear was visualized.  Rotator cuff tear was repaired  using a combination of bone tunnels made by [facility] device and one 6.5  corkscrew suture anchor.  Margin-convergent sutures were placed, and the  tear was mobilized.  Maximum footprint coverage was achieved using belt and  suspenders technique with the Curvetek and suture anchor.  At this time,  biceps tenodesis was performed.  Transverse humeral ligament was incised.  Biceps tendon was tagged with a  #2 fiber wire suture.  A 7 x 25 mm hole was  then made at the junction of the humeral shaft and humeral head.  Biceps  tenodesis was performed using the Arthrex tenodesis corkscrew 7 x 23 mm.  Excellent fixation was achieved.  Transverse humeral ligament was then  closed using 2-0 fiber wire suture.  Incision was then thoroughly irrigated.  Deltoid was reapproximated using #1 Vicryl suture.  The arm was taken  through a range of motion.  No impingement was noted in the subacromial  space.  Skin was closed using interrupted #2-0 Vicryl and 3-0 Prolene.  The  patient was placed in a bulky dressing and shoulder immobilizer.  She  tolerated the procedure well, without any complications.       GSD/MEDQ  D:  10/13/2003  T:  10/13/2003  Job:  [id number]

## 2010-05-26 NOTE — Op Note (Signed)
   NAME:  Elizabeth Calhoun, Elizabeth Calhoun                      ACCOUNT NO.:  [id number]   MEDICAL RECORD NO.:  [id number]                   PATIENT TYPE:  AMB   LOCATION:  ENDO                                 [facility]ACILITY[facility]   PHYSICIAN:  Petra Kuba, M.D.                 DATE OF BIRTH:  1945-12-12   DATE OF PROCEDURE:  10/09/2001  DATE OF DISCHARGE:                                 OPERATIVE REPORT   PROCEDURE:  Colonoscopy with polypectomy.   INDICATIONS FOR PROCEDURE:  Family history of questionable type of cancer  due for colonic screening. Consent was signed after risks, benefits,  methods, and options were thoroughly discussed in the office.   MEDICINES USED:  Fentanyl 70, Versed 7.   DESCRIPTION OF PROCEDURE:  Rectal inspection was pertinent for external  hemorrhoids. Digital exam was negative. The video pediatric adjustable  colonoscope was inserted, easily advanced around the colon to the cecum.  This did not require any abdominal pressure or any position changes. No  obvious abnormality was seen on insertion. The scope was slowly withdrawn.  The cecum was identified by the appendiceal orifice and the ileocecal valve.  The prep was adequate. There was some liquid stool that required washing and  suctioning. On slow withdrawal through the colon, other than a tiny  descending polyp which was hot biopsied x1, no other abnormalities were  seen. Specifically no other polyps, masses or diverticula. Once back in the  rectum, the scope was retroflexed pertinent for some small internal  hemorrhoids. The scope was straightened and readvanced a short ways up the  left side of the colon, air was suctioned, scope removed. The patient  tolerated the procedure well. There was no obvious or immediate  complication.   ENDOSCOPIC DIAGNOSIS:  1. Internal and external hemorrhoids.  2. Tiny descending polyp hot biopsied.  3. Otherwise within normal limits to the cecum.   PLAN:  Await pathology but  probably recheck colon screening in five years.  Happy to see back p.r.n., otherwise, return care to Dr. Abigail Miyamoto for the  customary health care maintenance to include yearly rectals and guaiacs.                                               Petra Kuba, M.D.    MEM/MEDQ  D:  10/09/2001  T:  10/10/2001  Job:  [id number]   cc:   Chales Salmon. Abigail Miyamoto, M.D.

## 2010-06-19 ENCOUNTER — Ambulatory Visit: Payer: Medicare Other | Admitting: Physical Medicine & Rehabilitation

## 2010-06-19 ENCOUNTER — Encounter: Payer: Medicare Other | Attending: Physical Medicine & Rehabilitation

## 2010-06-19 DIAGNOSIS — I10 Essential (primary) hypertension: Secondary | ICD-10-CM | POA: Insufficient documentation

## 2010-06-19 DIAGNOSIS — I69959 Hemiplegia and hemiparesis following unspecified cerebrovascular disease affecting unspecified side: Secondary | ICD-10-CM | POA: Insufficient documentation

## 2010-06-19 DIAGNOSIS — I6330 Cerebral infarction due to thrombosis of unspecified cerebral artery: Secondary | ICD-10-CM

## 2010-06-19 DIAGNOSIS — E119 Type 2 diabetes mellitus without complications: Secondary | ICD-10-CM | POA: Insufficient documentation

## 2010-06-19 DIAGNOSIS — I69998 Other sequelae following unspecified cerebrovascular disease: Secondary | ICD-10-CM | POA: Insufficient documentation

## 2010-06-19 DIAGNOSIS — I69993 Ataxia following unspecified cerebrovascular disease: Secondary | ICD-10-CM | POA: Insufficient documentation

## 2010-06-19 DIAGNOSIS — R279 Unspecified lack of coordination: Secondary | ICD-10-CM | POA: Insufficient documentation

## 2010-06-19 DIAGNOSIS — R29898 Other symptoms and signs involving the musculoskeletal system: Secondary | ICD-10-CM | POA: Insufficient documentation

## 2010-06-19 DIAGNOSIS — G8110 Spastic hemiplegia affecting unspecified side: Secondary | ICD-10-CM

## 2010-06-19 DIAGNOSIS — R209 Unspecified disturbances of skin sensation: Secondary | ICD-10-CM | POA: Insufficient documentation

## 2010-06-20 NOTE — Assessment & Plan Note (Signed)
REASON FOR VISIT:  Stroke.  A 65 year old female with prior history of CVA.  She also has carpal tunnel syndrome as well as right shoulder pain.  She had left pontine stroke in December of 2011, and went through inpatient as well as outpatient rehabilitation.  She no longer receives any Forensic scientist.  Her EMG on March 21, 2010, showed bilateral median neuropathy at the wrist rated as moderate, but she responded well to splints.  She is seeing Dr. Dorene Grebe, in regard to her shoulder pain.  Pain level is about 5/10.  She is scheduled for another injection with Dr. August Saucer.  SOCIAL HISTORY:  Married, lives with her husband, planning to visit [place] at the end of the month.  PHYSICAL EXAMINATION:  VITAL SIGNS:  Blood pressure 114/52, pulse 63, respirations 18, O2 sat 100% on room air. GENERAL:  No acute distress.  Mood and affect appropriate. MUSCULOSKELETAL:  Her hands have no evidence of sensory deficit to pinprick.  Negative Tinel's at the wrist.  Motor strength is 5/5 bilateral upper and lower extremities.  Gait is normal.  Deep tendon reflexes are 3+ bilateral knees, 2+ bilateral ankles, and 1 at the biceps and triceps bilaterally. There is no dysdiadochokinesis on rapid alternating pronation supination bilateral upper extremities.  IMPRESSION: 1. Left pontine cerebrovascular accident with right hemiparesis which     is essentially resolved at this point. 2. Carpal tunnel syndrome improved with conservative care. 3. Right shoulder pain, followup with Ortho.  PLAN:  I will see her back on a p.r.n. basis certainly if she has some recurrence of a carpal tunnel type symptoms, I can get her in for carpal tunnel injection.  Discussed with patient and husband, agree with plan.     Erick Colace, M.D. Electronically Signed    AEK/MedQ D:  06/19/2010 09:41:43  T:  06/20/2010 00:03:42  Job #:  [id number]  cc:   G. Dorene Grebe, M.D. Fax: [phone]

## 2011-01-31 DIAGNOSIS — I6789 Other cerebrovascular disease: Secondary | ICD-10-CM | POA: Diagnosis not present

## 2011-01-31 DIAGNOSIS — G479 Sleep disorder, unspecified: Secondary | ICD-10-CM | POA: Diagnosis not present

## 2011-01-31 DIAGNOSIS — E7800 Pure hypercholesterolemia, unspecified: Secondary | ICD-10-CM | POA: Diagnosis not present

## 2011-01-31 DIAGNOSIS — I10 Essential (primary) hypertension: Secondary | ICD-10-CM | POA: Diagnosis not present

## 2011-01-31 DIAGNOSIS — E119 Type 2 diabetes mellitus without complications: Secondary | ICD-10-CM | POA: Diagnosis not present

## 2011-04-03 ENCOUNTER — Other Ambulatory Visit: Payer: Self-pay | Admitting: Family Medicine

## 2011-04-03 DIAGNOSIS — Z1231 Encounter for screening mammogram for malignant neoplasm of breast: Secondary | ICD-10-CM

## 2011-05-15 ENCOUNTER — Ambulatory Visit
Admission: RE | Admit: 2011-05-15 | Discharge: 2011-05-15 | Disposition: A | Discharge status: Home / Self Care | Payer: Medicare Other | Source: Ambulatory Visit | Attending: Family Medicine | Admitting: Family Medicine

## 2011-05-15 DIAGNOSIS — Z1231 Encounter for screening mammogram for malignant neoplasm of breast: Secondary | ICD-10-CM

## 2011-06-22 ENCOUNTER — Encounter: Payer: Self-pay | Admitting: *Deleted

## 2011-08-01 DIAGNOSIS — E119 Type 2 diabetes mellitus without complications: Secondary | ICD-10-CM | POA: Diagnosis not present

## 2011-08-01 DIAGNOSIS — I10 Essential (primary) hypertension: Secondary | ICD-10-CM | POA: Diagnosis not present

## 2011-08-01 DIAGNOSIS — G479 Sleep disorder, unspecified: Secondary | ICD-10-CM | POA: Diagnosis not present

## 2011-08-01 DIAGNOSIS — I6789 Other cerebrovascular disease: Secondary | ICD-10-CM | POA: Diagnosis not present

## 2011-08-01 DIAGNOSIS — E7800 Pure hypercholesterolemia, unspecified: Secondary | ICD-10-CM | POA: Diagnosis not present

## 2012-02-01 DIAGNOSIS — G479 Sleep disorder, unspecified: Secondary | ICD-10-CM | POA: Diagnosis not present

## 2012-02-01 DIAGNOSIS — E669 Obesity, unspecified: Secondary | ICD-10-CM | POA: Diagnosis not present

## 2012-02-01 DIAGNOSIS — I10 Essential (primary) hypertension: Secondary | ICD-10-CM | POA: Diagnosis not present

## 2012-02-01 DIAGNOSIS — E119 Type 2 diabetes mellitus without complications: Secondary | ICD-10-CM | POA: Diagnosis not present

## 2012-02-01 DIAGNOSIS — J069 Acute upper respiratory infection, unspecified: Secondary | ICD-10-CM | POA: Diagnosis not present

## 2012-02-01 DIAGNOSIS — E7800 Pure hypercholesterolemia, unspecified: Secondary | ICD-10-CM | POA: Diagnosis not present

## 2012-02-01 DIAGNOSIS — I6789 Other cerebrovascular disease: Secondary | ICD-10-CM | POA: Diagnosis not present

## 2012-03-12 ENCOUNTER — Encounter: Payer: Self-pay | Admitting: Cardiology

## 2012-04-14 ENCOUNTER — Other Ambulatory Visit: Payer: Self-pay

## 2012-04-14 DIAGNOSIS — Z1231 Encounter for screening mammogram for malignant neoplasm of breast: Secondary | ICD-10-CM

## 2012-05-01 DIAGNOSIS — E7800 Pure hypercholesterolemia, unspecified: Secondary | ICD-10-CM | POA: Diagnosis not present

## 2012-05-20 ENCOUNTER — Ambulatory Visit
Admission: RE | Admit: 2012-05-20 | Discharge: 2012-05-20 | Disposition: A | Discharge status: Home / Self Care | Payer: Medicare Other | Source: Ambulatory Visit

## 2012-05-20 DIAGNOSIS — Z1231 Encounter for screening mammogram for malignant neoplasm of breast: Secondary | ICD-10-CM | POA: Diagnosis not present

## 2012-08-01 DIAGNOSIS — E669 Obesity, unspecified: Secondary | ICD-10-CM | POA: Diagnosis not present

## 2012-08-01 DIAGNOSIS — G479 Sleep disorder, unspecified: Secondary | ICD-10-CM | POA: Diagnosis not present

## 2012-08-01 DIAGNOSIS — Z79899 Other long term (current) drug therapy: Secondary | ICD-10-CM | POA: Diagnosis not present

## 2012-08-01 DIAGNOSIS — I6789 Other cerebrovascular disease: Secondary | ICD-10-CM | POA: Diagnosis not present

## 2012-08-01 DIAGNOSIS — E7800 Pure hypercholesterolemia, unspecified: Secondary | ICD-10-CM | POA: Diagnosis not present

## 2012-08-01 DIAGNOSIS — L989 Disorder of the skin and subcutaneous tissue, unspecified: Secondary | ICD-10-CM | POA: Diagnosis not present

## 2012-08-01 DIAGNOSIS — I10 Essential (primary) hypertension: Secondary | ICD-10-CM | POA: Diagnosis not present

## 2012-08-05 ENCOUNTER — Other Ambulatory Visit: Payer: Self-pay | Admitting: Family Medicine

## 2012-08-05 DIAGNOSIS — E2839 Other primary ovarian failure: Secondary | ICD-10-CM

## 2012-08-19 ENCOUNTER — Other Ambulatory Visit: Payer: Medicare Other

## 2012-08-27 DIAGNOSIS — M25519 Pain in unspecified shoulder: Secondary | ICD-10-CM | POA: Diagnosis not present

## 2012-08-28 DIAGNOSIS — H2510 Age-related nuclear cataract, unspecified eye: Secondary | ICD-10-CM | POA: Diagnosis not present

## 2012-08-29 ENCOUNTER — Ambulatory Visit
Admission: RE | Admit: 2012-08-29 | Discharge: 2012-08-29 | Disposition: A | Discharge status: Home / Self Care | Payer: Medicare Other | Source: Ambulatory Visit | Attending: Family Medicine | Admitting: Family Medicine

## 2012-08-29 DIAGNOSIS — M899 Disorder of bone, unspecified: Secondary | ICD-10-CM | POA: Diagnosis not present

## 2012-08-29 DIAGNOSIS — E2839 Other primary ovarian failure: Secondary | ICD-10-CM

## 2012-09-05 DIAGNOSIS — M19019 Primary osteoarthritis, unspecified shoulder: Secondary | ICD-10-CM | POA: Diagnosis not present

## 2012-09-10 DIAGNOSIS — M25519 Pain in unspecified shoulder: Secondary | ICD-10-CM | POA: Diagnosis not present

## 2013-01-23 DIAGNOSIS — R05 Cough: Secondary | ICD-10-CM | POA: Diagnosis not present

## 2013-01-23 DIAGNOSIS — R059 Cough, unspecified: Secondary | ICD-10-CM | POA: Diagnosis not present

## 2013-01-23 DIAGNOSIS — R509 Fever, unspecified: Secondary | ICD-10-CM | POA: Diagnosis not present

## 2013-04-23 ENCOUNTER — Other Ambulatory Visit: Payer: Self-pay

## 2013-04-23 DIAGNOSIS — Z1231 Encounter for screening mammogram for malignant neoplasm of breast: Secondary | ICD-10-CM

## 2013-04-27 DIAGNOSIS — H02409 Unspecified ptosis of unspecified eyelid: Secondary | ICD-10-CM | POA: Diagnosis not present

## 2013-05-07 ENCOUNTER — Encounter (HOSPITAL_COMMUNITY): Payer: Self-pay | Admitting: Emergency Medicine

## 2013-05-07 ENCOUNTER — Emergency Department (INDEPENDENT_AMBULATORY_CARE_PROVIDER_SITE_OTHER)
Admission: EM | Admit: 2013-05-07 | Discharge: 2013-05-07 | Disposition: A | Discharge status: Home / Self Care | Payer: Medicare Other | Source: Home / Self Care | Attending: Emergency Medicine | Admitting: Emergency Medicine

## 2013-05-07 DIAGNOSIS — J301 Allergic rhinitis due to pollen: Secondary | ICD-10-CM

## 2013-05-07 DIAGNOSIS — J309 Allergic rhinitis, unspecified: Secondary | ICD-10-CM

## 2013-05-07 DIAGNOSIS — J029 Acute pharyngitis, unspecified: Secondary | ICD-10-CM | POA: Diagnosis not present

## 2013-05-07 LAB — POCT RAPID STREP A: STREPTOCOCCUS, GROUP A SCREEN (DIRECT): NEGATIVE

## 2013-05-07 MED ORDER — CLINDAMYCIN HCL 300 MG PO CAPS: 300.0000 mg | ORAL_CAPSULE | Freq: Three times a day (TID) | ORAL | Status: DC

## 2013-05-07 MED ORDER — LORATADINE 10 MG PO TABS: 10.0000 mg | ORAL_TABLET | Freq: Every day | ORAL | Status: DC

## 2013-05-07 NOTE — ED Provider Notes (Signed)
CSN: [id number]     Arrival date & time 05/07/13  1123 History   First MD Initiated Contact with Patient 05/07/13 1149     No chief complaint on file.  (Consider location/radiation/quality/duration/timing/severity/associated sxs/prior Treatment) HPI Comments: Patient presents with 7-10 day history of throat discomfort with associated cough, sneezing, and itchy watery eyes. Denies fever. Reports she has been gargling at home with salt water and vinegar and has not had relief. PCP: Dr. Sabra Heck at [facility] Patient is a non-smoker  Patient is a 68 y.o. female presenting with pharyngitis. The history is provided by the patient.  Sore Throat    Past Medical History  Diagnosis Date  . Hemorrhoids   . OA (osteoarthritis)   . Obesity   . Other and unspecified hyperlipidemia   . Menopause   . Adenomatous colon polyp    No past surgical history on file. Family History  Problem Relation Age of Onset  . Heart attack Father   . Heart disease Brother     1/8  . Heart disease Brother     2/8  . Heart disease Brother     3/8  . Heart disease Brother     4/8  . Heart disease Brother     5/8  . Heart disease Brother     6/8  . Cancer Brother     7/8  . Seizures Brother     8/8  . Stroke Brother     8/8  . Heart disease Sister     1/6  . Alzheimer's disease Sister     2/6  . Diabetes Sister     3/6  . Hypertension Sister     3/6  . Hypertension Sister     4/6  . Diabetes Sister     4/6  . Diabetes Daughter    History  Substance Use Topics  . Smoking status: Never Smoker   . Smokeless tobacco: Never Used  . Alcohol Use: Yes     Comment: occasionally   OB History   Grav Para Term Preterm Abortions TAB SAB Ect Mult Living                 Review of Systems  All other systems reviewed and are negative.   Allergies  Aspirin  Home Medications   Prior to Admission medications   Medication Sig Start Date End Date Taking? Authorizing Provider   glipiZIDE (GLUCOTROL XL) 2.5 MG 24 hr tablet Take 2.5 mg by mouth daily.    Historical Provider, MD  hydrochlorothiazide (HYDRODIURIL) 25 MG tablet Take 25 mg by mouth daily.    Historical Provider, MD  lisinopril (PRINIVIL,ZESTRIL) 10 MG tablet Take 10 mg by mouth daily.    Historical Provider, MD  metFORMIN (GLUCOPHAGE-XR) 500 MG 24 hr tablet Take 500 mg by mouth daily with breakfast.    Historical Provider, MD  metoprolol succinate (TOPROL-XL) 25 MG 24 hr tablet Take 25 mg by mouth daily.    Historical Provider, MD  Rofecoxib (VIOXX PO) Take by mouth.    Historical Provider, MD  rosuvastatin (CRESTOR) 10 MG tablet Take 10 mg by mouth daily.    Historical Provider, MD  zolpidem (AMBIEN) 10 MG tablet Take 10 mg by mouth at bedtime as needed.    Historical Provider, MD   There were no vitals taken for this visit. Physical Exam  Nursing note and vitals reviewed. Constitutional: She is oriented to person, place, and time. She appears well-developed and well-nourished.  No distress.  HENT:  Head: Normocephalic and atraumatic.  Right Ear: Hearing, tympanic membrane, external ear and ear canal normal.  Left Ear: Hearing, tympanic membrane, external ear and ear canal normal.  Nose: Nose normal.  Mouth/Throat: Uvula is midline, oropharynx is clear and moist and mucous membranes are normal. No oral lesions. No trismus in the jaw. No uvula swelling.  Trace of clear fluid with slightly retracted ear drum on right  Eyes: Conjunctivae are normal.  Neck: Normal range of motion. Neck supple.  Cardiovascular: Normal rate, regular rhythm and normal heart sounds.   Pulmonary/Chest: Effort normal and breath sounds normal. No respiratory distress. She has no wheezes.  Musculoskeletal: Normal range of motion.  Lymphadenopathy:    She has no cervical adenopathy.  Neurological: She is alert and oriented to person, place, and time.  Skin: Skin is warm and dry. No rash noted.  Psychiatric: She has a normal  mood and affect. Her behavior is normal.    ED Course  Procedures (including critical care time) Labs Review Labs Reviewed - No data to display  Imaging Review No results found.   MDM  No diagnosis found.  strep test was negative. Patient has an element of hayfever based upon symptoms and exam. To be treated for organisms other than strep that can cause a sore throat. Please use medications as directed and follow up with your doctor if you have no improvement.   [place], [place] 05/07/13 1258

## 2013-05-07 NOTE — Discharge Instructions (Signed)
Your strep test was negative. You do have a element of hayfever based upon your symptoms and exam. Your strep test was negative. You will be treated for organisms other than strep that can cause a sore throat. Please use medications as directed and follow up with your doctor if you have no improvement.   Hay Fever Hay fever is an allergic reaction to particles in the air. It cannot be passed from person to person. It cannot be cured, but it can be controlled. CAUSES  Hay fever is caused by something that triggers an allergic reaction (allergens). The following are examples of allergens:  Ragweed.  Feathers.  Animal dander.  Grass and tree pollens.  Cigarette smoke.  House dust.  Pollution. SYMPTOMS   Sneezing.  Runny or stuffy nose.  Tearing eyes.  Itchy eyes, nose, mouth, throat, skin, or other area.  Sore throat.  Headache.  Decreased sense of smell or taste. DIAGNOSIS Your caregiver will perform a physical exam and ask questions about the symptoms you are having.Allergy testing may be done to determine exactly what triggers your hay fever.  TREATMENT   Over-the-counter medicines may help symptoms. These include:  Antihistamines.  Decongestants. These may help with nasal congestion.  Your caregiver may prescribe medicines if over-the-counter medicines do not work.  Some people benefit from allergy shots when other medicines are not helpful. HOME CARE INSTRUCTIONS   Avoid the allergen that is causing your symptoms, if possible.  Take all medicine as told by your caregiver. SEEK MEDICAL CARE IF:   You have severe allergy symptoms and your current medicines are not helping.  Your treatment was working at one time, but you are now experiencing symptoms.  You have sinus congestion and pressure.  You develop a fever or headache.  You have thick nasal discharge.  You have asthma and have a worsening cough and wheezing. SEEK IMMEDIATE MEDICAL CARE IF:    You have swelling of your tongue or lips.  You have trouble breathing.  You feel lightheaded or like you are going to faint.  You have cold sweats.  You have a fever. Document Released: 12/25/2004 Document Revised: 03/19/2011 Document Reviewed: 03/22/2010 [facility] Patient Information 2014 [place].  Pharyngitis Pharyngitis is redness, pain, and swelling (inflammation) of your pharynx.  CAUSES  Pharyngitis is usually caused by infection. Most of the time, these infections are from viruses (viral) and are part of a cold. However, sometimes pharyngitis is caused by bacteria (bacterial). Pharyngitis can also be caused by allergies. Viral pharyngitis may be spread from person to person by coughing, sneezing, and personal items or utensils (cups, forks, spoons, toothbrushes). Bacterial pharyngitis may be spread from person to person by more intimate contact, such as kissing.  SIGNS AND SYMPTOMS  Symptoms of pharyngitis include:   Sore throat.   Tiredness (fatigue).   Low-grade fever.   Headache.  Joint pain and muscle aches.  Skin rashes.  Swollen lymph nodes.  Plaque-like film on throat or tonsils (often seen with bacterial pharyngitis). DIAGNOSIS  Your health care provider will ask you questions about your illness and your symptoms. Your medical history, along with a physical exam, is often all that is needed to diagnose pharyngitis. Sometimes, a rapid strep test is done. Other lab tests may also be done, depending on the suspected cause.  TREATMENT  Viral pharyngitis will usually get better in 3 4 days without the use of medicine. Bacterial pharyngitis is treated with medicines that kill germs (antibiotics).  HOME CARE  INSTRUCTIONS   Drink enough water and fluids to keep your urine clear or pale yellow.   Only take over-the-counter or prescription medicines as directed by your health care provider:   If you are prescribed antibiotics, make sure you finish  them even if you start to feel better.   Do not take aspirin.   Get lots of rest.   Gargle with 8 oz of salt water ( tsp of salt per 1 qt of water) as often as every 1 2 hours to soothe your throat.   Throat lozenges (if you are not at risk for choking) or sprays may be used to soothe your throat. SEEK MEDICAL CARE IF:   You have large, tender lumps in your neck.  You have a rash.  You cough up green, yellow-brown, or bloody spit. SEEK IMMEDIATE MEDICAL CARE IF:   Your neck becomes stiff.  You drool or are unable to swallow liquids.  You vomit or are unable to keep medicines or liquids down.  You have severe pain that does not go away with the use of recommended medicines.  You have trouble breathing (not caused by a stuffy nose). MAKE SURE YOU:   Understand these instructions.  Will watch your condition.  Will get help right away if you are not doing well or get worse. Document Released: 12/25/2004 Document Revised: 10/15/2012 Document Reviewed: 09/01/2012 [facility] Patient Information 2014 [place].

## 2013-05-07 NOTE — ED Notes (Signed)
Pt c/o sore throat onset 1.5 weeks Sx include odynophagia, coughing, sneezing, itchy eyes Denies f/v/n/d Gargling warm salt water, alka seltzer w/no relief Alert w/no signs of acute distress.

## 2013-05-08 NOTE — ED Provider Notes (Signed)
Medical screening examination/treatment/procedure(s) were performed by non-physician practitioner and as supervising physician I was immediately available for consultation/collaboration.  Philipp Deputy, M.D.  Harden Mo, MD 05/08/13 [phone]

## 2013-05-09 LAB — CULTURE, GROUP A STREP

## 2013-05-11 NOTE — ED Notes (Signed)
Throat culture: Strep beta hemolytic not group A.  Pt. Adequately treated with Clindamycin.  Needs notified. Hanley Seamen Jahkari Maclin 05/11/2013

## 2013-05-12 ENCOUNTER — Telehealth (HOSPITAL_COMMUNITY): Payer: Self-pay | Admitting: *Deleted

## 2013-05-12 NOTE — ED Notes (Addendum)
I called and left a message to call.  Call 1. 05/12/2013 Left message.  Call 2. 05/13/2013 I called home number.  Pt. verified x 2 and given result.  Pt. told she was adequately treated with Clindamycin and to get rechecked if not better after finishing the medication. Pt. Informed if anyone she exposed gets the same symptoms should get checked for strep as well.  Pt. Voiced understanding. Hanley Seamen Holliday Sheaffer 05/13/2013

## 2013-05-20 DIAGNOSIS — M545 Low back pain, unspecified: Secondary | ICD-10-CM | POA: Diagnosis not present

## 2013-05-20 DIAGNOSIS — M25519 Pain in unspecified shoulder: Secondary | ICD-10-CM | POA: Diagnosis not present

## 2013-05-21 ENCOUNTER — Ambulatory Visit
Admission: RE | Admit: 2013-05-21 | Discharge: 2013-05-21 | Disposition: A | Discharge status: Home / Self Care | Payer: Medicare Other | Source: Ambulatory Visit

## 2013-05-21 DIAGNOSIS — Z1231 Encounter for screening mammogram for malignant neoplasm of breast: Secondary | ICD-10-CM | POA: Diagnosis not present

## 2013-05-31 DIAGNOSIS — M19079 Primary osteoarthritis, unspecified ankle and foot: Secondary | ICD-10-CM | POA: Diagnosis not present

## 2013-05-31 DIAGNOSIS — M47817 Spondylosis without myelopathy or radiculopathy, lumbosacral region: Secondary | ICD-10-CM | POA: Diagnosis not present

## 2013-06-03 DIAGNOSIS — M545 Low back pain, unspecified: Secondary | ICD-10-CM | POA: Diagnosis not present

## 2013-06-03 DIAGNOSIS — M25579 Pain in unspecified ankle and joints of unspecified foot: Secondary | ICD-10-CM | POA: Diagnosis not present

## 2013-06-13 DIAGNOSIS — M47817 Spondylosis without myelopathy or radiculopathy, lumbosacral region: Secondary | ICD-10-CM | POA: Diagnosis not present

## 2013-06-13 DIAGNOSIS — M19079 Primary osteoarthritis, unspecified ankle and foot: Secondary | ICD-10-CM | POA: Diagnosis not present

## 2013-06-16 DIAGNOSIS — I10 Essential (primary) hypertension: Secondary | ICD-10-CM | POA: Diagnosis not present

## 2013-06-16 DIAGNOSIS — E7800 Pure hypercholesterolemia, unspecified: Secondary | ICD-10-CM | POA: Diagnosis not present

## 2013-06-16 DIAGNOSIS — Z23 Encounter for immunization: Secondary | ICD-10-CM | POA: Diagnosis not present

## 2013-06-16 DIAGNOSIS — G479 Sleep disorder, unspecified: Secondary | ICD-10-CM | POA: Diagnosis not present

## 2013-06-16 DIAGNOSIS — E119 Type 2 diabetes mellitus without complications: Secondary | ICD-10-CM | POA: Diagnosis not present

## 2013-06-16 DIAGNOSIS — I6789 Other cerebrovascular disease: Secondary | ICD-10-CM | POA: Diagnosis not present

## 2013-06-16 DIAGNOSIS — E669 Obesity, unspecified: Secondary | ICD-10-CM | POA: Diagnosis not present

## 2013-06-16 DIAGNOSIS — D126 Benign neoplasm of colon, unspecified: Secondary | ICD-10-CM | POA: Diagnosis not present

## 2013-06-17 DIAGNOSIS — M25579 Pain in unspecified ankle and joints of unspecified foot: Secondary | ICD-10-CM | POA: Diagnosis not present

## 2013-07-14 DIAGNOSIS — Z86010 Personal history of colonic polyps: Secondary | ICD-10-CM | POA: Diagnosis not present

## 2013-07-14 DIAGNOSIS — Z09 Encounter for follow-up examination after completed treatment for conditions other than malignant neoplasm: Secondary | ICD-10-CM | POA: Diagnosis not present

## 2014-02-26 DIAGNOSIS — I672 Cerebral atherosclerosis: Secondary | ICD-10-CM | POA: Diagnosis not present

## 2014-02-26 DIAGNOSIS — M8580 Other specified disorders of bone density and structure, unspecified site: Secondary | ICD-10-CM | POA: Diagnosis not present

## 2014-02-26 DIAGNOSIS — M1990 Unspecified osteoarthritis, unspecified site: Secondary | ICD-10-CM | POA: Diagnosis not present

## 2014-02-26 DIAGNOSIS — E119 Type 2 diabetes mellitus without complications: Secondary | ICD-10-CM | POA: Diagnosis not present

## 2014-02-26 DIAGNOSIS — F329 Major depressive disorder, single episode, unspecified: Secondary | ICD-10-CM | POA: Diagnosis not present

## 2014-02-26 DIAGNOSIS — E780 Pure hypercholesterolemia: Secondary | ICD-10-CM | POA: Diagnosis not present

## 2014-02-26 DIAGNOSIS — I10 Essential (primary) hypertension: Secondary | ICD-10-CM | POA: Diagnosis not present

## 2014-02-26 DIAGNOSIS — I2510 Atherosclerotic heart disease of native coronary artery without angina pectoris: Secondary | ICD-10-CM | POA: Diagnosis not present

## 2014-03-10 DIAGNOSIS — E119 Type 2 diabetes mellitus without complications: Secondary | ICD-10-CM | POA: Diagnosis not present

## 2014-03-10 DIAGNOSIS — E785 Hyperlipidemia, unspecified: Secondary | ICD-10-CM | POA: Diagnosis not present

## 2014-04-09 ENCOUNTER — Encounter: Payer: Medicare Other | Attending: Family Medicine | Admitting: *Deleted

## 2014-04-09 ENCOUNTER — Encounter: Payer: Self-pay | Admitting: *Deleted

## 2014-04-09 VITALS — Ht 60.0 in | Wt 161.8 lb

## 2014-04-09 DIAGNOSIS — E118 Type 2 diabetes mellitus with unspecified complications: Secondary | ICD-10-CM | POA: Insufficient documentation

## 2014-04-09 DIAGNOSIS — Z713 Dietary counseling and surveillance: Secondary | ICD-10-CM | POA: Insufficient documentation

## 2014-04-09 DIAGNOSIS — E1165 Type 2 diabetes mellitus with hyperglycemia: Secondary | ICD-10-CM

## 2014-04-09 DIAGNOSIS — IMO0002 Reserved for concepts with insufficient information to code with codable children: Secondary | ICD-10-CM

## 2014-04-09 NOTE — Progress Notes (Signed)
Diabetes Self-Management Education  Visit Type:  Initial  Appt. Start Time: 0930 Appt. End Time: 1100  04/09/2014  Ms. Bess Harvest, identified by name and date of birth, is a 69 y.o. female with a diagnosis of Diabetes: Type 2.  Other people present during visit:  Patient   ASSESSMENT  Height 5' (1.524 m), weight 161 lb 12.8 oz (73.392 kg). Body mass index is 31.6 kg/(m^2).  Initial Visit Information:  Are you currently following a meal plan?: No (Does eat a lot of vegetables and salad)   Are you taking your medications as prescribed?: No (may skip Metformin if going out during the day due to diarrhea side effect) Are you checking your feet?: Yes How many days per week are you checking your feet?: 4      Psychosocial:     Patient Belief/Attitude about Diabetes: Motivated to manage diabetes Self-care barriers: Other (comment) (grieving the loss of her husband in 2014) Self-management support: Family, [place] office Other persons present: Patient Patient Concerns: Nutrition/Meal planning Special Needs: Simplified materials Preferred Learning Style: Auditory Learning Readiness: Ready  Complications:   Last HgB A1C per patient/outside source: 11.4% How often do you check your blood sugar?:  (testing less than once a day now that husband has passed away) Fasting Blood glucose range (mg/dL): >200 Postprandial Blood glucose range (mg/dL): >200 Number of hypoglycemic episodes per month: 0 Have you had a dilated eye exam in the past 12 months?: No Have you had a dental exam in the past 12 months?: Yes  Diet Intake:  Breakfast: skips often or may have 2 eggs with bacon, fruit cocktail sometimes, coffee Snack (morning): occasionally PNB on a spoon OR chips Lunch: skips OR may go to buffet, : fish or liver or chicken and gravy, starch, always vegetables, water Snack (afternoon): same as AM Dinner: meat, starch and vegetables, water Snack (evening): chips Beverage(s):  coffee, water  Exercise:  Exercise: Light (walking / raking leaves) (walks at the mall, trying to go every day as able) Light Exercise amount of time (min / week): 150  Individualized Plan for Diabetes Self-Management Training:   Learning Objective:  Patient will have a greater understanding of diabetes self-management.  Patient education plan per assessed needs and concerns is to attend individual sessions for     Education Topics Reviewed with Patient Today:  Definition of diabetes, type 1 and 2, and the diagnosis of diabetes Role of diet in the treatment of diabetes and the relationship between the three main macronutrients and blood glucose level, Food label reading, portion sizes and measuring food., Carbohydrate counting Helped patient identify appropriate exercises in relation to his/her diabetes, diabetes complications and other health issue. Reviewed patients medication for diabetes, action, purpose, timing of dose and side effects. Identified appropriate SMBG and/or A1C goals.   Relationship between chronic complications and blood glucose control Helped patient identify a support system for diabetes management, Role of stress on diabetes      PATIENTS GOALS/Plan (Developed by the patient):  Nutrition: Follow meal plan discussed Physical Activity: Exercise 3-5 times per week Medications: take my medication as prescribed (Ask MD about Metformin ER due to the side effects you are having ) Monitoring : test blood glucose pre and post meals as discussed  Plan:   Patient Instructions  Plan:  Aim for 3 Carb Choices per meal (45 grams) +/- 1 either way  Aim for 0-1 Carbs per snack if hungry  Include protein in moderation with your meals and snacks  Continue with your activity level by walking at the mall daily as tolerated Consider checking BG at alternate times per day  Consider taking medication Metformin with food as directed by MD Ask MD about choice of Extended  Release Metformin which you may tolerate better Considered flavored water instead of juice to take your medications     Expected Outcomes:     Education material provided: Living Well with Diabetes, Meal plan card and Carbohydrate counting sheet  If problems or questions, patient to contact team via:  Phone and Email  Future DSME appointment: PRN

## 2014-04-09 NOTE — Patient Instructions (Signed)
Plan:  Aim for 3 Carb Choices per meal (45 grams) +/- 1 either way  Aim for 0-1 Carbs per snack if hungry  Include protein in moderation with your meals and snacks Continue with your activity level by walking at the mall daily as tolerated Consider checking BG at alternate times per day  Consider taking medication Metformin with food as directed by MD Ask MD about choice of Extended Release Metformin which you may tolerate better Considered flavored water instead of juice to take your medications

## 2014-05-12 ENCOUNTER — Other Ambulatory Visit: Payer: Self-pay

## 2014-05-12 DIAGNOSIS — Z1231 Encounter for screening mammogram for malignant neoplasm of breast: Secondary | ICD-10-CM

## 2014-05-14 DIAGNOSIS — I10 Essential (primary) hypertension: Secondary | ICD-10-CM | POA: Diagnosis not present

## 2014-05-14 DIAGNOSIS — E1165 Type 2 diabetes mellitus with hyperglycemia: Secondary | ICD-10-CM | POA: Diagnosis not present

## 2014-05-14 DIAGNOSIS — M1990 Unspecified osteoarthritis, unspecified site: Secondary | ICD-10-CM | POA: Diagnosis not present

## 2014-05-14 DIAGNOSIS — Z79899 Other long term (current) drug therapy: Secondary | ICD-10-CM | POA: Diagnosis not present

## 2014-05-14 DIAGNOSIS — E119 Type 2 diabetes mellitus without complications: Secondary | ICD-10-CM | POA: Diagnosis not present

## 2014-05-14 DIAGNOSIS — I672 Cerebral atherosclerosis: Secondary | ICD-10-CM | POA: Diagnosis not present

## 2014-05-14 DIAGNOSIS — E780 Pure hypercholesterolemia: Secondary | ICD-10-CM | POA: Diagnosis not present

## 2014-05-24 ENCOUNTER — Ambulatory Visit: Payer: BC Managed Care – PPO

## 2014-05-27 ENCOUNTER — Ambulatory Visit
Admission: RE | Admit: 2014-05-27 | Discharge: 2014-05-27 | Disposition: A | Discharge status: Home / Self Care | Payer: Medicare Other | Source: Ambulatory Visit

## 2014-05-27 DIAGNOSIS — Z1231 Encounter for screening mammogram for malignant neoplasm of breast: Secondary | ICD-10-CM

## 2014-06-21 DIAGNOSIS — Z79899 Other long term (current) drug therapy: Secondary | ICD-10-CM | POA: Diagnosis not present

## 2014-06-21 DIAGNOSIS — E119 Type 2 diabetes mellitus without complications: Secondary | ICD-10-CM | POA: Diagnosis not present

## 2014-06-21 DIAGNOSIS — E780 Pure hypercholesterolemia: Secondary | ICD-10-CM | POA: Diagnosis not present

## 2014-07-21 DIAGNOSIS — Z79899 Other long term (current) drug therapy: Secondary | ICD-10-CM | POA: Diagnosis not present

## 2014-07-21 DIAGNOSIS — E780 Pure hypercholesterolemia: Secondary | ICD-10-CM | POA: Diagnosis not present

## 2014-08-13 DIAGNOSIS — I10 Essential (primary) hypertension: Secondary | ICD-10-CM | POA: Diagnosis not present

## 2014-08-13 DIAGNOSIS — E780 Pure hypercholesterolemia: Secondary | ICD-10-CM | POA: Diagnosis not present

## 2014-08-13 DIAGNOSIS — R739 Hyperglycemia, unspecified: Secondary | ICD-10-CM | POA: Diagnosis not present

## 2014-08-13 DIAGNOSIS — E119 Type 2 diabetes mellitus without complications: Secondary | ICD-10-CM | POA: Diagnosis not present

## 2014-08-13 DIAGNOSIS — E1165 Type 2 diabetes mellitus with hyperglycemia: Secondary | ICD-10-CM | POA: Diagnosis not present

## 2014-08-13 DIAGNOSIS — M130 Polyarthritis, unspecified: Secondary | ICD-10-CM | POA: Diagnosis not present

## 2014-09-28 DIAGNOSIS — R7309 Other abnormal glucose: Secondary | ICD-10-CM | POA: Diagnosis not present

## 2014-09-28 DIAGNOSIS — Z79899 Other long term (current) drug therapy: Secondary | ICD-10-CM | POA: Diagnosis not present

## 2014-09-28 DIAGNOSIS — E119 Type 2 diabetes mellitus without complications: Secondary | ICD-10-CM | POA: Diagnosis not present

## 2014-09-28 DIAGNOSIS — E780 Pure hypercholesterolemia: Secondary | ICD-10-CM | POA: Diagnosis not present

## 2014-10-15 DIAGNOSIS — F329 Major depressive disorder, single episode, unspecified: Secondary | ICD-10-CM | POA: Diagnosis not present

## 2014-10-15 DIAGNOSIS — Z23 Encounter for immunization: Secondary | ICD-10-CM | POA: Diagnosis not present

## 2014-10-15 DIAGNOSIS — E1165 Type 2 diabetes mellitus with hyperglycemia: Secondary | ICD-10-CM | POA: Diagnosis not present

## 2014-11-12 DIAGNOSIS — F329 Major depressive disorder, single episode, unspecified: Secondary | ICD-10-CM | POA: Diagnosis not present

## 2014-11-12 DIAGNOSIS — I10 Essential (primary) hypertension: Secondary | ICD-10-CM | POA: Diagnosis not present

## 2014-11-12 DIAGNOSIS — Z79899 Other long term (current) drug therapy: Secondary | ICD-10-CM | POA: Diagnosis not present

## 2014-11-12 DIAGNOSIS — E119 Type 2 diabetes mellitus without complications: Secondary | ICD-10-CM | POA: Diagnosis not present

## 2015-03-12 ENCOUNTER — Emergency Department (HOSPITAL_COMMUNITY): Payer: Medicare Other

## 2015-03-12 ENCOUNTER — Emergency Department (HOSPITAL_COMMUNITY)
Admission: EM | Admit: 2015-03-12 | Discharge: 2015-03-12 | Disposition: A | Discharge status: Home / Self Care | Payer: Medicare Other | Attending: Emergency Medicine | Admitting: Emergency Medicine

## 2015-03-12 ENCOUNTER — Encounter (HOSPITAL_COMMUNITY): Payer: Self-pay | Admitting: Emergency Medicine

## 2015-03-12 DIAGNOSIS — E785 Hyperlipidemia, unspecified: Secondary | ICD-10-CM | POA: Diagnosis not present

## 2015-03-12 DIAGNOSIS — E119 Type 2 diabetes mellitus without complications: Secondary | ICD-10-CM | POA: Diagnosis not present

## 2015-03-12 DIAGNOSIS — Z8673 Personal history of transient ischemic attack (TIA), and cerebral infarction without residual deficits: Secondary | ICD-10-CM | POA: Insufficient documentation

## 2015-03-12 DIAGNOSIS — Z8742 Personal history of other diseases of the female genital tract: Secondary | ICD-10-CM | POA: Diagnosis not present

## 2015-03-12 DIAGNOSIS — R42 Dizziness and giddiness: Secondary | ICD-10-CM | POA: Diagnosis not present

## 2015-03-12 DIAGNOSIS — Z8719 Personal history of other diseases of the digestive system: Secondary | ICD-10-CM | POA: Insufficient documentation

## 2015-03-12 DIAGNOSIS — Z86010 Personal history of colonic polyps: Secondary | ICD-10-CM | POA: Insufficient documentation

## 2015-03-12 DIAGNOSIS — R51 Headache: Secondary | ICD-10-CM | POA: Diagnosis not present

## 2015-03-12 DIAGNOSIS — M1990 Unspecified osteoarthritis, unspecified site: Secondary | ICD-10-CM | POA: Insufficient documentation

## 2015-03-12 DIAGNOSIS — E669 Obesity, unspecified: Secondary | ICD-10-CM | POA: Diagnosis not present

## 2015-03-12 DIAGNOSIS — R112 Nausea with vomiting, unspecified: Secondary | ICD-10-CM | POA: Insufficient documentation

## 2015-03-12 HISTORY — DX: Cerebral infarction, unspecified: I63.9

## 2015-03-12 LAB — CBC
HEMATOCRIT: 39.2 % (ref 36.0–46.0)
Hemoglobin: 12.8 g/dL (ref 12.0–15.0)
MCH: 29.6 pg (ref 26.0–34.0)
MCHC: 32.7 g/dL (ref 30.0–36.0)
MCV: 90.5 fL (ref 78.0–100.0)
PLATELETS: 265 10*3/uL (ref 150–400)
RBC: 4.33 MIL/uL (ref 3.87–5.11)
RDW: 12.2 % (ref 11.5–15.5)
WBC: 6.8 10*3/uL (ref 4.0–10.5)

## 2015-03-12 LAB — BASIC METABOLIC PANEL
Anion gap: 11 (ref 5–15)
BUN: 11 mg/dL (ref 6–20)
CALCIUM: 9.3 mg/dL (ref 8.9–10.3)
CO2: 25 mmol/L (ref 22–32)
CREATININE: 0.84 mg/dL (ref 0.44–1.00)
Chloride: 102 mmol/L (ref 101–111)
GFR calc Af Amer: >60 mL/min (ref >60)
GLUCOSE: 267 mg/dL — AB (ref 65–99)
Potassium: 3.8 mmol/L (ref 3.5–5.1)
Sodium: 138 mmol/L (ref 135–145)

## 2015-03-12 LAB — CBG MONITORING, ED: GLUCOSE-CAPILLARY: 247 mg/dL — AB (ref 65–99)

## 2015-03-12 MED ORDER — MECLIZINE HCL 25 MG PO TABS
25.0000 mg | ORAL_TABLET | Freq: Once | ORAL | Status: AC
  Administered 2015-03-12: 25 mg via ORAL
  Filled 2015-03-12: qty 1

## 2015-03-12 MED ORDER — ONDANSETRON HCL 4 MG/2ML IJ SOLN
4.0000 mg | Freq: Once | INTRAMUSCULAR | Status: DC
  Filled 2015-03-12: qty 2

## 2015-03-12 MED ORDER — SODIUM CHLORIDE 0.9 % IV SOLN: INTRAVENOUS | Status: DC

## 2015-03-12 MED ORDER — SODIUM CHLORIDE 0.9 % IV SOLN
INTRAVENOUS | Status: DC
Start: 2015-03-12 — End: 2015-03-12

## 2015-03-12 MED ORDER — MECLIZINE HCL 25 MG PO TABS: 25.0000 mg | ORAL_TABLET | Freq: Three times a day (TID) | ORAL | Status: DC | PRN

## 2015-03-12 NOTE — ED Provider Notes (Signed)
CSN: [id number]     Arrival date & time 03/12/15  1514 History   First MD Initiated Contact with Patient 03/12/15 1559     Chief Complaint  Patient presents with  . Dizziness  . Emesis     (Consider location/radiation/quality/duration/timing/severity/associated sxs/prior Treatment) Patient is a 70 y.o. female presenting with dizziness and vomiting. The history is provided by the patient.  Dizziness Associated symptoms: headaches, nausea and vomiting   Associated symptoms: no chest pain, no diarrhea, no shortness of breath and no weakness   Emesis Associated symptoms: headaches   Associated symptoms: no abdominal pain and no diarrhea    patient presents with dizziness and room spinning started on Thursday. Associated with nausea and vomiting no diarrhea. No upper respiratory cold type symptoms currently. Patient [facility]. Patient status post CVA in 2011. Both family and patient denies any speech problems any numbness or motor weakness. Associated with mild headache to the 4 head.  Past Medical History  Diagnosis Date  . Hemorrhoids   . OA (osteoarthritis)   . Obesity   . Other and unspecified hyperlipidemia   . Menopause   . Adenomatous colon polyp   . Diabetes mellitus without complication ([place])   . Stroke [facility])    Past Surgical History  Procedure Laterality Date  . Shoulder surgery  2005   Family History  Problem Relation Age of Onset  . Heart attack Father   . Heart disease Brother     1/8  . Heart disease Brother     2/8  . Heart disease Brother     3/8  . Heart disease Brother     4/8  . Heart disease Brother     5/8  . Heart disease Brother     6/8  . Cancer Brother     7/8  . Seizures Brother     8/8  . Stroke Brother     8/8  . Heart disease Sister     1/6  . Alzheimer's disease Sister     2/6  . Diabetes Sister     3/6  . Hypertension Sister     3/6  . Hypertension Sister     4/6  . Diabetes Sister      4/6  . Diabetes Daughter    Social History  Substance Use Topics  . Smoking status: Never Smoker   . Smokeless tobacco: Never Used  . Alcohol Use: Yes     Comment: occasionally   OB History    No data available     Review of Systems  Constitutional: Negative for fever.  HENT: Negative for congestion.   Eyes: Negative for visual disturbance.  Respiratory: Negative for shortness of breath.   Cardiovascular: Negative for chest pain.  Gastrointestinal: Positive for nausea and vomiting. Negative for abdominal pain and diarrhea.  Genitourinary: Negative for dysuria.  Musculoskeletal: Negative for back pain and neck pain.  Skin: Negative for rash.  Neurological: Positive for dizziness and headaches. Negative for seizures, speech difficulty, weakness and numbness.  Hematological: Does not bruise/bleed easily.  Psychiatric/Behavioral: Negative for confusion.      Allergies  Aspirin  Home Medications   Prior to Admission medications   Medication Sig Start Date End Date Taking? Authorizing Provider  atorvastatin (LIPITOR) 80 MG tablet Take 80 mg by mouth at bedtime.  03/10/15  Yes Historical Provider, MD  ibuprofen (ADVIL,MOTRIN) 200 MG tablet Take 200-400 mg by mouth every 6 (six) hours  as needed (pain).   Yes Historical Provider, MD  lisinopril (PRINIVIL,ZESTRIL) 10 MG tablet Take 10 mg by mouth daily.   Yes Historical Provider, MD  metFORMIN (GLUCOPHAGE) 1000 MG tablet Take 1,000 mg by mouth 2 (two) times daily with a meal.   Yes Historical Provider, MD  pioglitazone (ACTOS) 15 MG tablet Take 15 mg by mouth daily. 03/10/15  Yes Historical Provider, MD  zolpidem (AMBIEN) 10 MG tablet Take 10 mg by mouth at bedtime.    Yes Historical Provider, MD  loratadine (CLARITIN) 10 MG tablet Take 1 tablet (10 mg total) by mouth daily. Patient not taking: Reported on 04/09/2014 05/07/13   Lutricia Feil, PA  meclizine (ANTIVERT) 25 MG tablet Take 1 tablet (25 mg total) by mouth 3 (three)  times daily as needed for dizziness. 03/12/15   Fredia Sorrow, MD   BP 178/84 mmHg  Pulse 62  Temp(Src) 98.1 F (36.7 C) (Oral)  Resp 17  Ht 5' (1.524 m)  SpO2 100% Physical Exam  Constitutional: She is oriented to person, place, and time. She appears well-developed and well-nourished. No distress.  HENT:  Head: Normocephalic and atraumatic.  Mouth/Throat: Oropharynx is clear and moist.  Eyes: Conjunctivae and EOM are normal. Pupils are equal, round, and reactive to light.  Neck: Normal range of motion. Neck supple.  Cardiovascular: Regular rhythm.   Pulmonary/Chest: Effort normal and breath sounds normal.  Abdominal: Soft. Bowel sounds are normal. There is no tenderness.  Musculoskeletal: Normal range of motion. She exhibits no edema.  Neurological: She is alert and oriented to person, place, and time. No cranial nerve deficit. She exhibits normal muscle tone. Coordination normal.  Skin: Skin is warm. No rash noted.  Nursing note and vitals reviewed.   ED Course  Procedures (including critical care time) Labs Review Labs Reviewed  BASIC METABOLIC PANEL - Abnormal; Notable for the following:    Glucose, Bld 267 (*)    All other components within normal limits  CBG MONITORING, ED - Abnormal; Notable for the following:    Glucose-Capillary 247 (*)    All other components within normal limits  CBC  URINALYSIS, ROUTINE W REFLEX MICROSCOPIC (NOT AT [facility])    Imaging Review Dg Chest 2 View  03/12/2015  CLINICAL DATA:  Dizziness for 3 days. EXAM: CHEST  2 VIEW COMPARISON:  01/23/2013 FINDINGS: There is mild cardiac enlargement. There is no pleural effusion or edema identified. No airspace consolidation. Osteoarthritis is noted involving both glenohumeral joints. Mild multi level thoracic degenerative disc disease is present. IMPRESSION: 1. No acute cardiopulmonary abnormalities. Electronically Signed   By: Kerby Moors M.D.   On: 03/12/2015 17:14   Ct Head Wo Contrast  03/12/2015   CLINICAL DATA:  Headache and dizziness starting earlier today EXAM: CT HEAD WITHOUT CONTRAST TECHNIQUE: Contiguous axial images were obtained from the base of the skull through the vertex without intravenous contrast. COMPARISON:  Head CT dated 01/06/2010 and brain MRI dated 01/07/2010. FINDINGS: Again noted is mild generalized brain atrophy with commensurate dilatation of the ventricles and sulci. Chronic small vessel ischemic changes again noted within the bilateral periventricular white matter regions. Small old lacunar infarcts noted within each basal ganglia region. Again noted is the old infarct in the lower right temporal lobe with associated encephalomalacia. There are chronic calcified atherosclerotic changes of the large vessels at the skull base. There is no mass, hemorrhage, edema or other evidence of acute parenchymal abnormality. No extra-axial hemorrhage. Osseous structures are unremarkable. Visualized upper  paranasal sinuses are clear. Mastoid air cells are clear. Superficial soft tissues are unremarkable. IMPRESSION: 1. No acute findings.  No intracranial mass, hemorrhage or edema. 2. Chronic ischemic changes as detailed above. Electronically Signed   By: Franki Cabot M.D.   On: 03/12/2015 18:21   Mr Brain Wo Contrast  03/12/2015  CLINICAL DATA:  Acute onset of dizziness and vomiting 2 days ago. EXAM: MRI HEAD WITHOUT CONTRAST TECHNIQUE: Multiplanar, multiecho pulse sequences of the brain and surrounding structures were obtained without intravenous contrast. COMPARISON:  Head CT same day.  MRI 01/07/2010. FINDINGS: Diffusion imaging does not show any acute or subacute infarction. There are old small vessel infarctions within the pons, particularly on the left. There is an old infarction affecting the cerebellum on the right. There are chronic small-vessel ischemic changes affecting the thalami I and within the cerebral hemispheric white matter diffusely. No cortical or large vessel territory  infarction. No mass lesion, hemorrhage, hydrocephalus or extra-axial collection. No pituitary mass. No fluid in the sinuses, middle ears or mastoids. IMPRESSION: No acute finding. Old infarction in the left pons. Old infarction in the right cerebellum. Chronic small-vessel ischemic changes of the cerebral hemispheric white matter. Electronically Signed   By: Nelson Chimes M.D.   On: 03/12/2015 19:36   I have personally reviewed and evaluated these images and lab results as part of my medical decision-making.   EKG Interpretation   Date/Time:  Saturday March 12 2015 15:31:23 EST Ventricular Rate:  78 PR Interval:  176 QRS Duration: 84 QT Interval:  406 QTC Calculation: 462 R Axis:   -8 Text Interpretation:  Sinus rhythm with occasional Premature ventricular  complexes and Fusion complexes Otherwise normal ECG Interpretation limited  secondary to artifact Confirmed by Harriett Azar  MD, Romon Devereux ([id number]) on  03/12/2015 4:00:50 PM      MDM   Final diagnoses:  Vertigo    Patient symptoms consistent with the vertigo associated with vomiting. Patient has true room spinning. Extensive workup here to include MRI of brain head CT labs EKG without any acute findings. Patient improved with anti-bur. Will continue that. Have her follow-up with her doctors. Patient with no recent upper respiratory infection.  Patient with history of remote CVAs. Patient was aware of these.  Fredia Sorrow, MD 03/12/15 2020

## 2015-03-12 NOTE — ED Notes (Addendum)
Pt c/o dizziness and vomiting onset Thursday. Pt reports that she went to lunch with her daughters but did not say anything to them. Pt has not taken her medications for 4 months and just recently restarted back on them.

## 2015-03-12 NOTE — Discharge Instructions (Signed)
Benign Positional Vertigo °Vertigo is the feeling that you or your surroundings are moving when they are not. Benign positional vertigo is the most common form of vertigo. The cause of this condition is not serious (is benign). This condition is triggered by certain movements and positions (is positional). This condition can be dangerous if it occurs while you are doing something that could endanger you or others, such as driving.  °CAUSES °In many cases, the cause of this condition is not known. It may be caused by a disturbance in an area of the inner ear that helps your brain to sense movement and balance. This disturbance can be caused by a viral infection (labyrinthitis), head injury, or repetitive motion. °RISK FACTORS °This condition is more likely to develop in: °· Women. °· People who are 50 years of age or older. °SYMPTOMS °Symptoms of this condition usually happen when you move your head or your eyes in different directions. Symptoms may start suddenly, and they usually last for less than a minute. Symptoms may include: °· Loss of balance and falling. °· Feeling like you are spinning or moving. °· Feeling like your surroundings are spinning or moving. °· Nausea and vomiting. °· Blurred vision. °· Dizziness. °· Involuntary eye movement (nystagmus). °Symptoms can be mild and cause only slight annoyance, or they can be severe and interfere with daily life. Episodes of benign positional vertigo may return (recur) over time, and they may be triggered by certain movements. Symptoms may improve over time. °DIAGNOSIS °This condition is usually diagnosed by medical history and a physical exam of the head, neck, and ears. You may be referred to a health care provider who specializes in ear, nose, and throat (ENT) problems (otolaryngologist) or a provider who specializes in disorders of the nervous system (neurologist). You may have additional testing, including: °· MRI. °· A CT scan. °· Eye movement tests. Your  health care provider may ask you to change positions quickly while he or she watches you for symptoms of benign positional vertigo, such as nystagmus. Eye movement may be tested with an electronystagmogram (ENG), caloric stimulation, the Dix-Hallpike test, or the roll test. °· An electroencephalogram (EEG). This records electrical activity in your brain. °· Hearing tests. °TREATMENT °Usually, your health care provider will treat this by moving your head in specific positions to adjust your inner ear back to normal. Surgery may be needed in severe cases, but this is rare. In some cases, benign positional vertigo may resolve on its own in 2-4 weeks. °HOME CARE INSTRUCTIONS °Safety °· Move slowly. Avoid sudden body or head movements. °· Avoid driving. °· Avoid operating heavy machinery. °· Avoid doing any tasks that would be dangerous to you or others if a vertigo episode would occur. °· If you have trouble walking or keeping your balance, try using a cane for stability. If you feel dizzy or unstable, sit down right away. °· Return to your normal activities as told by your health care provider. Ask your health care provider what activities are safe for you. °General Instructions °· Take over-the-counter and prescription medicines only as told by your health care provider. °· Avoid certain positions or movements as told by your health care provider. °· Drink enough fluid to keep your urine clear or pale yellow. °· Keep all follow-up visits as told by your health care provider. This is important. °SEEK MEDICAL CARE IF: °· You have a fever. °· Your condition gets worse or you develop new symptoms. °· Your family or friends   notice any behavioral changes.  Your nausea or vomiting gets worse.  You have numbness or a "pins and needles" sensation. SEEK IMMEDIATE MEDICAL CARE IF:  You have difficulty speaking or moving.  You are always dizzy.  You faint.  You develop severe headaches.  You have weakness in your  legs or arms.  You have changes in your hearing or vision.  You develop a stiff neck.  You develop sensitivity to light.   This information is not intended to replace advice given to you by your health care provider. Make sure you discuss any questions you have with your health care provider.   Document Released: 10/02/2005 Document Revised: 09/15/2014 Document Reviewed: 04/19/2014 Elsevier Interactive Patient Education [employer].  Return for any new or worse symptoms. Take the Antivert as directed. Make an appointment to follow-up with your regular doctor. If symptoms persist additional workup will be required from what we did today. MRI of the brain showed no evidence of any stroke or tumors.

## 2015-03-16 DIAGNOSIS — E119 Type 2 diabetes mellitus without complications: Secondary | ICD-10-CM | POA: Diagnosis not present

## 2015-03-16 DIAGNOSIS — Z79899 Other long term (current) drug therapy: Secondary | ICD-10-CM | POA: Diagnosis not present

## 2015-03-16 DIAGNOSIS — R42 Dizziness and giddiness: Secondary | ICD-10-CM | POA: Diagnosis not present

## 2015-03-16 DIAGNOSIS — I10 Essential (primary) hypertension: Secondary | ICD-10-CM | POA: Diagnosis not present

## 2015-03-17 DIAGNOSIS — H2513 Age-related nuclear cataract, bilateral: Secondary | ICD-10-CM | POA: Diagnosis not present

## 2015-03-17 DIAGNOSIS — E119 Type 2 diabetes mellitus without complications: Secondary | ICD-10-CM | POA: Diagnosis not present

## 2015-05-24 ENCOUNTER — Other Ambulatory Visit: Payer: Self-pay

## 2015-05-24 DIAGNOSIS — Z1231 Encounter for screening mammogram for malignant neoplasm of breast: Secondary | ICD-10-CM

## 2015-06-24 ENCOUNTER — Ambulatory Visit
Admission: RE | Admit: 2015-06-24 | Discharge: 2015-06-24 | Disposition: A | Discharge status: Home / Self Care | Payer: Medicare Other | Source: Ambulatory Visit

## 2015-06-24 DIAGNOSIS — Z1231 Encounter for screening mammogram for malignant neoplasm of breast: Secondary | ICD-10-CM | POA: Diagnosis not present

## 2015-09-15 ENCOUNTER — Other Ambulatory Visit: Payer: Self-pay

## 2015-09-23 DIAGNOSIS — I10 Essential (primary) hypertension: Secondary | ICD-10-CM | POA: Diagnosis not present

## 2015-09-23 DIAGNOSIS — E1165 Type 2 diabetes mellitus with hyperglycemia: Secondary | ICD-10-CM | POA: Diagnosis not present

## 2015-09-23 DIAGNOSIS — F4323 Adjustment disorder with mixed anxiety and depressed mood: Secondary | ICD-10-CM | POA: Diagnosis not present

## 2015-09-23 DIAGNOSIS — K219 Gastro-esophageal reflux disease without esophagitis: Secondary | ICD-10-CM | POA: Diagnosis not present

## 2015-10-12 ENCOUNTER — Ambulatory Visit (INDEPENDENT_AMBULATORY_CARE_PROVIDER_SITE_OTHER): Payer: Medicare Other | Admitting: Orthopedic Surgery

## 2015-10-12 DIAGNOSIS — M25561 Pain in right knee: Secondary | ICD-10-CM

## 2015-10-12 DIAGNOSIS — M545 Low back pain: Secondary | ICD-10-CM | POA: Diagnosis not present

## 2015-10-24 ENCOUNTER — Encounter (INDEPENDENT_AMBULATORY_CARE_PROVIDER_SITE_OTHER): Payer: Medicare Other | Admitting: Physical Medicine and Rehabilitation

## 2015-10-24 DIAGNOSIS — M5416 Radiculopathy, lumbar region: Secondary | ICD-10-CM | POA: Diagnosis not present

## 2015-10-25 DIAGNOSIS — E1165 Type 2 diabetes mellitus with hyperglycemia: Secondary | ICD-10-CM | POA: Diagnosis not present

## 2015-11-24 DIAGNOSIS — E119 Type 2 diabetes mellitus without complications: Secondary | ICD-10-CM | POA: Diagnosis not present

## 2015-11-24 DIAGNOSIS — Z79899 Other long term (current) drug therapy: Secondary | ICD-10-CM | POA: Diagnosis not present

## 2015-12-27 DIAGNOSIS — Z23 Encounter for immunization: Secondary | ICD-10-CM | POA: Diagnosis not present

## 2015-12-27 DIAGNOSIS — I10 Essential (primary) hypertension: Secondary | ICD-10-CM | POA: Diagnosis not present

## 2015-12-27 DIAGNOSIS — E1165 Type 2 diabetes mellitus with hyperglycemia: Secondary | ICD-10-CM | POA: Diagnosis not present

## 2015-12-27 DIAGNOSIS — G479 Sleep disorder, unspecified: Secondary | ICD-10-CM | POA: Diagnosis not present

## 2016-01-17 DIAGNOSIS — E119 Type 2 diabetes mellitus without complications: Secondary | ICD-10-CM | POA: Diagnosis not present

## 2016-01-17 DIAGNOSIS — Z7984 Long term (current) use of oral hypoglycemic drugs: Secondary | ICD-10-CM | POA: Diagnosis not present

## 2016-01-17 DIAGNOSIS — I10 Essential (primary) hypertension: Secondary | ICD-10-CM | POA: Diagnosis not present

## 2016-01-17 DIAGNOSIS — Z79899 Other long term (current) drug therapy: Secondary | ICD-10-CM | POA: Diagnosis not present

## 2016-02-16 DIAGNOSIS — E1165 Type 2 diabetes mellitus with hyperglycemia: Secondary | ICD-10-CM | POA: Diagnosis not present

## 2016-02-16 DIAGNOSIS — Z7984 Long term (current) use of oral hypoglycemic drugs: Secondary | ICD-10-CM | POA: Diagnosis not present

## 2016-02-16 DIAGNOSIS — I6930 Unspecified sequelae of cerebral infarction: Secondary | ICD-10-CM | POA: Diagnosis not present

## 2016-02-16 DIAGNOSIS — K5900 Constipation, unspecified: Secondary | ICD-10-CM | POA: Diagnosis not present

## 2016-02-17 DIAGNOSIS — E1165 Type 2 diabetes mellitus with hyperglycemia: Secondary | ICD-10-CM | POA: Diagnosis not present

## 2016-02-17 DIAGNOSIS — Z7984 Long term (current) use of oral hypoglycemic drugs: Secondary | ICD-10-CM | POA: Diagnosis not present

## 2016-02-28 ENCOUNTER — Telehealth (INDEPENDENT_AMBULATORY_CARE_PROVIDER_SITE_OTHER): Payer: Self-pay

## 2016-02-28 NOTE — Telephone Encounter (Signed)
Patient left vm stating she needed to make an appt with [name] asap due to extreme right hip pain since this weekend. I called her back. Last saw [name] in October and she states she did well with that injection but this is different pain. No injury, [name] is not in the office today and does not have any appts available until next week. Scheduled her to see Dr. Erlinda Hong tomorrow morning which she was fine with.

## 2016-02-29 ENCOUNTER — Ambulatory Visit (INDEPENDENT_AMBULATORY_CARE_PROVIDER_SITE_OTHER): Payer: Medicare Other

## 2016-02-29 ENCOUNTER — Ambulatory Visit (INDEPENDENT_AMBULATORY_CARE_PROVIDER_SITE_OTHER): Payer: Medicare Other | Admitting: Orthopaedic Surgery

## 2016-02-29 DIAGNOSIS — M25551 Pain in right hip: Secondary | ICD-10-CM

## 2016-02-29 MED ORDER — MELOXICAM 7.5 MG PO TABS
7.5000 mg | ORAL_TABLET | Freq: Two times a day (BID) | ORAL | 2 refills | Status: DC | PRN
Start: 2016-02-29 — End: 2017-10-01

## 2016-02-29 MED ORDER — METHOCARBAMOL 500 MG PO TABS: 500.0000 mg | ORAL_TABLET | Freq: Four times a day (QID) | ORAL | 2 refills | Status: DC | PRN

## 2016-02-29 MED ORDER — PREDNISONE 10 MG (21) PO TBPK: ORAL_TABLET | ORAL | 0 refills | Status: DC

## 2016-02-29 NOTE — Progress Notes (Signed)
Office Visit Note   Patient: Elizabeth Calhoun           Date of Birth: [birth year]           MRN: [id number] Visit Date: 02/29/2016              Requested by: No referring provider defined for this encounter. PCP: Pcp Not In System   Assessment & Plan: Visit Diagnoses:  1. Pain in right hip     Plan: LBP with acute exacerbation, likely has facet disease.  Sterapred, robaxin, mobic prn.  F/u prn.  Follow-Up Instructions: Return if symptoms worsen or fail to improve.   Orders:  Orders Placed This Encounter  Procedures  . XR HIP UNILAT W OR W/O PELVIS 2-3 VIEWS RIGHT   Meds ordered this encounter  Medications  . predniSONE (STERAPRED UNI-PAK 21 TAB) 10 MG (21) TBPK tablet    Sig: Take as directed    Dispense:  21 tablet    Refill:  0  . methocarbamol (ROBAXIN) 500 MG tablet    Sig: Take 1 tablet (500 mg total) by mouth every 6 (six) hours as needed for muscle spasms.    Dispense:  30 tablet    Refill:  2  . meloxicam (MOBIC) 7.5 MG tablet    Sig: Take 1 tablet (7.5 mg total) by mouth 2 (two) times daily as needed for pain.    Dispense:  30 tablet    Refill:  2      Procedures: No procedures performed   Clinical Data: No additional findings.   Subjective: Chief Complaint  Patient presents with  . Right Hip - Pain    [name] comes in today for right lower back pain.  Denies radicular pain.  Denies groin pain.  Denies injuries.  Pain started this weekend.  It's worse at night.      Review of Systems  Constitutional: Negative.   HENT: Negative.   Eyes: Negative.   Respiratory: Negative.   Cardiovascular: Negative.   Endocrine: Negative.   Musculoskeletal: Negative.   Neurological: Negative.   Hematological: Negative.   Psychiatric/Behavioral: Negative.   All other systems reviewed and are negative.    Objective: Vital Signs: There were no vitals taken for this visit.  Physical Exam  Constitutional: She is oriented to person, place, and time. She  appears well-developed and well-nourished.  Pulmonary/Chest: Effort normal.  Neurological: She is alert and oriented to person, place, and time.  Skin: Skin is warm. Capillary refill takes less than 2 seconds.  Psychiatric: She has a normal mood and affect. Her behavior is normal. Judgment and thought content normal.  Nursing note and vitals reviewed.   Ortho Exam Right hip - exam is normal  +FABER sign -SLR  Specialty Comments:  No specialty comments available.  Imaging: No results found.   PMFS History: There are no active problems to display for this patient.  Past Medical History:  Diagnosis Date  . Adenomatous colon polyp   . Diabetes mellitus without complication ([place])   . Hemorrhoids   . Menopause   . OA (osteoarthritis)   . Obesity   . Other and unspecified hyperlipidemia   . Stroke [facility])     Family History  Problem Relation Age of Onset  . Heart attack Father   . Heart disease Brother     1/8  . Heart disease Brother     2/8  . Heart disease Brother     3/8  . Heart disease  Brother     4/8  . Heart disease Brother     5/8  . Heart disease Brother     6/8  . Cancer Brother     7/8  . Seizures Brother     8/8  . Stroke Brother     8/8  . Heart disease Sister     1/6  . Alzheimer's disease Sister     2/6  . Diabetes Sister     3/6  . Hypertension Sister     3/6  . Hypertension Sister     4/6  . Diabetes Sister     4/6  . Diabetes Daughter     Past Surgical History:  Procedure Laterality Date  . SHOULDER SURGERY  2005   Social History   Occupational History  . works at [employer]  . Smoking status: Never Smoker  . Smokeless tobacco: Never Used  . Alcohol use Yes     Comment: occasionally  . Drug use: No  . Sexual activity: Not on file

## 2016-03-26 DIAGNOSIS — I6930 Unspecified sequelae of cerebral infarction: Secondary | ICD-10-CM | POA: Diagnosis not present

## 2016-03-26 DIAGNOSIS — E1165 Type 2 diabetes mellitus with hyperglycemia: Secondary | ICD-10-CM | POA: Diagnosis not present

## 2016-03-26 DIAGNOSIS — Z7984 Long term (current) use of oral hypoglycemic drugs: Secondary | ICD-10-CM | POA: Diagnosis not present

## 2016-03-26 DIAGNOSIS — K5900 Constipation, unspecified: Secondary | ICD-10-CM | POA: Diagnosis not present

## 2016-04-04 DIAGNOSIS — E119 Type 2 diabetes mellitus without complications: Secondary | ICD-10-CM | POA: Diagnosis not present

## 2016-04-04 DIAGNOSIS — H2513 Age-related nuclear cataract, bilateral: Secondary | ICD-10-CM | POA: Diagnosis not present

## 2016-04-25 DIAGNOSIS — Z7984 Long term (current) use of oral hypoglycemic drugs: Secondary | ICD-10-CM | POA: Diagnosis not present

## 2016-04-25 DIAGNOSIS — E1165 Type 2 diabetes mellitus with hyperglycemia: Secondary | ICD-10-CM | POA: Diagnosis not present

## 2016-04-25 DIAGNOSIS — I6930 Unspecified sequelae of cerebral infarction: Secondary | ICD-10-CM | POA: Diagnosis not present

## 2016-05-28 ENCOUNTER — Other Ambulatory Visit: Payer: Self-pay | Admitting: Family Medicine

## 2016-05-28 DIAGNOSIS — Z1231 Encounter for screening mammogram for malignant neoplasm of breast: Secondary | ICD-10-CM

## 2016-06-07 ENCOUNTER — Telehealth (INDEPENDENT_AMBULATORY_CARE_PROVIDER_SITE_OTHER): Payer: Self-pay | Admitting: Orthopedic Surgery

## 2016-06-07 NOTE — Telephone Encounter (Signed)
Patient called advised she misplaced her handicap placard and want to know if Dr Marlou Sa can write her a Rx for another one. Patient said she has been looking for it but can not locate it. Patient said she is sorry she misplaced it. The number to contact patient is [phone]

## 2016-06-07 NOTE — Telephone Encounter (Signed)
I am not sure for how long we do not have other copy

## 2016-06-07 NOTE — Telephone Encounter (Signed)
Okay to give new one, patient misplaced it if so for how long?

## 2016-06-07 NOTE — Telephone Encounter (Signed)
Y same type as before

## 2016-06-08 NOTE — Telephone Encounter (Signed)
WILL MAIL HER THE FORM

## 2016-06-08 NOTE — Telephone Encounter (Signed)
PATIENT AWARE

## 2016-06-08 NOTE — Telephone Encounter (Signed)
Permanent pls call thx

## 2016-06-20 ENCOUNTER — Ambulatory Visit (INDEPENDENT_AMBULATORY_CARE_PROVIDER_SITE_OTHER): Payer: Medicare Other | Admitting: Orthopedic Surgery

## 2016-06-20 ENCOUNTER — Encounter (INDEPENDENT_AMBULATORY_CARE_PROVIDER_SITE_OTHER): Payer: Self-pay | Admitting: Orthopedic Surgery

## 2016-06-20 DIAGNOSIS — M5442 Lumbago with sciatica, left side: Secondary | ICD-10-CM | POA: Diagnosis not present

## 2016-06-20 DIAGNOSIS — M5441 Lumbago with sciatica, right side: Secondary | ICD-10-CM

## 2016-06-20 DIAGNOSIS — M1711 Unilateral primary osteoarthritis, right knee: Secondary | ICD-10-CM

## 2016-06-23 DIAGNOSIS — M1711 Unilateral primary osteoarthritis, right knee: Secondary | ICD-10-CM

## 2016-06-23 DIAGNOSIS — M5442 Lumbago with sciatica, left side: Secondary | ICD-10-CM | POA: Diagnosis not present

## 2016-06-23 DIAGNOSIS — M5441 Lumbago with sciatica, right side: Secondary | ICD-10-CM | POA: Diagnosis not present

## 2016-06-23 MED ORDER — METHYLPREDNISOLONE ACETATE 40 MG/ML IJ SUSP
40.0000 mg | INTRAMUSCULAR | Status: AC | PRN
Start: 2016-06-23 — End: 2016-06-23
  Administered 2016-06-23: 40 mg via INTRA_ARTICULAR

## 2016-06-23 MED ORDER — BUPIVACAINE HCL 0.25 % IJ SOLN
4.0000 mL | INTRAMUSCULAR | Status: AC | PRN
  Administered 2016-06-23: 4 mL via INTRA_ARTICULAR

## 2016-06-23 MED ORDER — LIDOCAINE HCL 1 % IJ SOLN
5.0000 mL | INTRAMUSCULAR | Status: AC | PRN
  Administered 2016-06-23: 5 mL

## 2016-06-23 NOTE — Progress Notes (Signed)
Office Visit Note   Patient: Elizabeth Calhoun           Date of Birth: 1945-03-08           MRN: [id number] Visit Date: 06/20/2016 Requested by: No referring provider defined for this encounter. PCP: System, Pcp Not In  Subjective: Chief Complaint  Patient presents with  . Right Leg - Pain  . Left Leg - Pain    HPI: [name] is a patient with bilateral leg pain right worse than left.  She states her legs getting tired with prolonged walking.  Denies any numbness and tingling but she does report radicular type of leg pain with activity.  She states that her legs feel weak and shaky.  She wants to get a right knee injection because she is getting radiated down to [place].  She denies any groin pain.  She does report back pain and she has had epidural steroid injections in the past with some relief.  She does have a history of known spinal stenosis.              ROS: All systems reviewed are negative as they relate to the chief complaint within the history of present illness.  Patient denies  fevers or chills.   Assessment & Plan: Visit Diagnoses:  1. Low back pain with bilateral sciatica, unspecified back pain laterality, unspecified chronicity     Plan: Impression is right knee pain with known arthritis as well as spinal stenosis with symptoms which are decreasing her walking endurance.  Injection into the knee is performed today numbness in her to [name] for lumbar epidural steroid.  I'll see her back as needed  Follow-Up Instructions: Return if symptoms worsen or fail to improve.   Orders:  Orders Placed This Encounter  Procedures  . Ambulatory referral to Physical Medicine Rehab   No orders of the defined types were placed in this encounter.     Procedures: Large Joint Inj Date/Time: 06/23/2016 3:35 PM Performed by: Meredith Pel Authorized by: Meredith Pel   Consent Given by:  Patient Site marked: the procedure site was marked   Timeout: prior to  procedure the correct patient, procedure, and site was verified   Indications:  Pain, joint swelling and diagnostic evaluation Location:  Knee Site:  R knee Prep: patient was prepped and draped in usual sterile fashion   Needle Size:  18 G Needle Length:  1.5 inches Approach:  Superolateral Ultrasound Guidance: No   Fluoroscopic Guidance: No   Arthrogram: No   Medications:  5 mL lidocaine 1 %; 4 mL bupivacaine 0.25 %; 40 mg methylPREDNISolone acetate 40 MG/ML Patient tolerance:  Patient tolerated the procedure well with no immediate complications     Clinical Data: No additional findings.  Objective: Vital Signs: There were no vitals taken for this visit.  Physical Exam:   Constitutional: Patient appears well-developed HEENT:  Head: Normocephalic Eyes:EOM are normal Neck: Normal range of motion Cardiovascular: Normal rate Pulmonary/chest: Effort normal Neurologic: Patient is alert Skin: Skin is warm Psychiatric: Patient has normal mood and affect    Ortho Exam: Orthopedic exam demonstrates good ankle dorsiflexion plantar flexion quite hamstring strength with no groin pain with internal/external rotation relate.  No paresthesias present in either leg.  Compartments soft right knee has pain with range of motion but no effusion.  Joint line tenderness is present.  Collateral and cruciate ligaments are stable in both knees.  No other masses lymph adenopathy or skin changes  noted in either knee region.  Patient has fairly reasonable forward lateral bending.  Specialty Comments:  No specialty comments available.  Imaging: No results found.   PMFS History: There are no active problems to display for this patient.  Past Medical History:  Diagnosis Date  . Adenomatous colon polyp   . Diabetes mellitus without complication ([place])   . Hemorrhoids   . Menopause   . OA (osteoarthritis)   . Obesity   . Other and unspecified hyperlipidemia   . Stroke [facility])     Family  History  Problem Relation Age of Onset  . Heart attack Father   . Heart disease Brother        1/8  . Heart disease Brother        2/8  . Heart disease Brother        3/8  . Heart disease Brother        4/8  . Heart disease Brother        5/8  . Heart disease Brother        6/8  . Cancer Brother        7/8  . Seizures Brother        8/8  . Stroke Brother        8/8  . Heart disease Sister        1/6  . Alzheimer's disease Sister        2/6  . Diabetes Sister        3/6  . Hypertension Sister        3/6  . Hypertension Sister        4/6  . Diabetes Sister        4/6  . Diabetes Daughter     Past Surgical History:  Procedure Laterality Date  . SHOULDER SURGERY  2005   Social History   Occupational History  . works at [employer]  . Smoking status: Never Smoker  . Smokeless tobacco: Never Used  . Alcohol use Yes     Comment: occasionally  . Drug use: No  . Sexual activity: Not on file

## 2016-06-25 ENCOUNTER — Ambulatory Visit
Admission: RE | Admit: 2016-06-25 | Discharge: 2016-06-25 | Disposition: A | Discharge status: Home / Self Care | Payer: Medicare Other | Source: Ambulatory Visit | Attending: Family Medicine | Admitting: Family Medicine

## 2016-06-25 DIAGNOSIS — Z1231 Encounter for screening mammogram for malignant neoplasm of breast: Secondary | ICD-10-CM

## 2016-07-04 ENCOUNTER — Ambulatory Visit (INDEPENDENT_AMBULATORY_CARE_PROVIDER_SITE_OTHER): Payer: Medicare Other

## 2016-07-04 ENCOUNTER — Encounter (INDEPENDENT_AMBULATORY_CARE_PROVIDER_SITE_OTHER): Payer: Self-pay | Admitting: Physical Medicine and Rehabilitation

## 2016-07-04 ENCOUNTER — Ambulatory Visit (INDEPENDENT_AMBULATORY_CARE_PROVIDER_SITE_OTHER): Payer: Medicare Other | Admitting: Physical Medicine and Rehabilitation

## 2016-07-04 VITALS — BP 127/77 | HR 68

## 2016-07-04 DIAGNOSIS — M47816 Spondylosis without myelopathy or radiculopathy, lumbar region: Secondary | ICD-10-CM

## 2016-07-04 DIAGNOSIS — M5416 Radiculopathy, lumbar region: Secondary | ICD-10-CM | POA: Diagnosis not present

## 2016-07-04 MED ORDER — LIDOCAINE HCL (PF) 1 % IJ SOLN
2.0000 mL | Freq: Once | INTRAMUSCULAR | Status: AC
  Administered 2016-07-04: 2 mL

## 2016-07-04 MED ORDER — METHYLPREDNISOLONE ACETATE 80 MG/ML IJ SUSP
80.0000 mg | Freq: Once | INTRAMUSCULAR | Status: AC
  Administered 2016-07-04: 80 mg

## 2016-07-04 NOTE — Patient Instructions (Signed)

## 2016-07-04 NOTE — Progress Notes (Deleted)
Pain across lower back. Right=left. Worse with walking and standing. Some relief with sitting. Denies radiating pain down leg but says she does have right knee pain and had injection recently with Dr. Marlou Sa.

## 2016-07-06 NOTE — Procedures (Signed)
Lumbar Epidural Steroid Injection - Interlaminar Approach with Fluoroscopic Guidance  Patient: Elizabeth Calhoun      Date of Birth: 1945/12/04 MRN: [id number] PCP: System, Pcp Not In      Visit Date: 07/04/2016   Elizabeth Calhoun is a 71 year old female followed by Dr. Marlou Sa for orthopedic complaints of back pain and knee pain. We've actually seen her in the past and completed an epidural injection at L4-5 with really good relief. Her symptoms are mostly axial at this point right equal to left. She does get some knee pain. She's had pain with standing and decreased pain with sitting. Go to repeat the lumbar epidural injection from a diagnostic and therapeutic standpoint since it has helped in the past. She is billed conservative care otherwise with medication management and therapy in the past. If she doesn't get good relief I would look at facet joint block for her she does have significant facet arthritis in the low back.  Universal Protocol:    Date/Time: 06/29/186:53 AM  Consent Given By: the patient  Position: PRONE  Additional Comments: Vital signs were monitored before and after the procedure. Patient was prepped and draped in the usual sterile fashion. The correct patient, procedure, and site was verified.   Injection Procedure Details:  Procedure Site One Meds Administered:  Meds ordered this encounter  Medications  . lidocaine (PF) (XYLOCAINE) 1 % injection 2 mL  . methylPREDNISolone acetate (DEPO-MEDROL) injection 80 mg     Laterality: Right  Location/Site:  L4-L5  Needle size: 20 G  Needle type: Tuohy  Needle Placement: Paramedian epidural  Findings:  -Contrast Used: 1 mL iohexol 180 mg iodine/mL   -Comments: Excellent flow of contrast into the epidural space.  Procedure Details: Using a paramedian approach from the side mentioned above, the region overlying the inferior lamina was localized under fluoroscopic visualization and the soft tissues overlying this  structure were infiltrated with 4 ml. of 1% Lidocaine without Epinephrine. The Tuohy needle was inserted into the epidural space using a paramedian approach.   The epidural space was localized using loss of resistance along with lateral and bi-planar fluoroscopic views.  After negative aspirate for air, blood, and CSF, a 2 ml. volume of Isovue-250 was injected into the epidural space and the flow of contrast was observed. Radiographs were obtained for documentation purposes.    The injectate was administered into the level noted above.   Additional Comments:  The patient tolerated the procedure well No complications occurred Dressing: Band-Aid    Post-procedure details: Patient was observed during the procedure. Post-procedure instructions were reviewed.  Patient left the clinic in stable condition.

## 2016-10-12 DIAGNOSIS — E119 Type 2 diabetes mellitus without complications: Secondary | ICD-10-CM | POA: Diagnosis not present

## 2016-10-12 DIAGNOSIS — H2513 Age-related nuclear cataract, bilateral: Secondary | ICD-10-CM | POA: Diagnosis not present

## 2016-10-15 DIAGNOSIS — E119 Type 2 diabetes mellitus without complications: Secondary | ICD-10-CM | POA: Diagnosis not present

## 2016-10-15 DIAGNOSIS — H2513 Age-related nuclear cataract, bilateral: Secondary | ICD-10-CM | POA: Diagnosis not present

## 2016-11-23 DIAGNOSIS — E119 Type 2 diabetes mellitus without complications: Secondary | ICD-10-CM | POA: Diagnosis not present

## 2016-11-23 DIAGNOSIS — Z23 Encounter for immunization: Secondary | ICD-10-CM | POA: Diagnosis not present

## 2016-11-23 DIAGNOSIS — E663 Overweight: Secondary | ICD-10-CM | POA: Diagnosis not present

## 2016-11-23 DIAGNOSIS — I672 Cerebral atherosclerosis: Secondary | ICD-10-CM | POA: Diagnosis not present

## 2016-11-23 DIAGNOSIS — E7800 Pure hypercholesterolemia, unspecified: Secondary | ICD-10-CM | POA: Diagnosis not present

## 2016-11-23 DIAGNOSIS — I10 Essential (primary) hypertension: Secondary | ICD-10-CM | POA: Diagnosis not present

## 2016-12-20 ENCOUNTER — Encounter (HOSPITAL_COMMUNITY): Payer: Self-pay | Admitting: Emergency Medicine

## 2016-12-20 ENCOUNTER — Emergency Department (HOSPITAL_COMMUNITY)
Admission: EM | Admit: 2016-12-20 | Discharge: 2016-12-20 | Disposition: A | Discharge status: Home / Self Care | Payer: Medicare Other | Attending: Emergency Medicine | Admitting: Emergency Medicine

## 2016-12-20 ENCOUNTER — Emergency Department (HOSPITAL_COMMUNITY): Payer: Medicare Other

## 2016-12-20 DIAGNOSIS — M549 Dorsalgia, unspecified: Secondary | ICD-10-CM | POA: Diagnosis not present

## 2016-12-20 DIAGNOSIS — J069 Acute upper respiratory infection, unspecified: Secondary | ICD-10-CM | POA: Diagnosis not present

## 2016-12-20 DIAGNOSIS — Z7902 Long term (current) use of antithrombotics/antiplatelets: Secondary | ICD-10-CM | POA: Insufficient documentation

## 2016-12-20 DIAGNOSIS — R079 Chest pain, unspecified: Secondary | ICD-10-CM | POA: Diagnosis not present

## 2016-12-20 DIAGNOSIS — E119 Type 2 diabetes mellitus without complications: Secondary | ICD-10-CM | POA: Insufficient documentation

## 2016-12-20 DIAGNOSIS — Z7984 Long term (current) use of oral hypoglycemic drugs: Secondary | ICD-10-CM | POA: Diagnosis not present

## 2016-12-20 DIAGNOSIS — Z79899 Other long term (current) drug therapy: Secondary | ICD-10-CM | POA: Insufficient documentation

## 2016-12-20 MED ORDER — OXYCODONE-ACETAMINOPHEN 5-325 MG PO TABS: 1.0000 | ORAL_TABLET | Freq: Once | ORAL | Status: DC

## 2016-12-20 MED ORDER — DEXAMETHASONE 4 MG PO TABS
8.0000 mg | ORAL_TABLET | Freq: Once | ORAL | Status: AC
  Administered 2016-12-20: 8 mg via ORAL
  Filled 2016-12-20: qty 2

## 2016-12-20 MED ORDER — MELOXICAM 7.5 MG PO TABS: 7.5000 mg | ORAL_TABLET | Freq: Two times a day (BID) | ORAL | 0 refills | Status: DC | PRN

## 2016-12-20 MED ORDER — LORAZEPAM 0.5 MG PO TABS
0.5000 mg | ORAL_TABLET | Freq: Once | ORAL | Status: AC
  Administered 2016-12-20: 0.5 mg via ORAL
  Filled 2016-12-20: qty 1

## 2016-12-20 MED ORDER — TRAMADOL HCL 50 MG PO TABS: 50.0000 mg | ORAL_TABLET | Freq: Four times a day (QID) | ORAL | 0 refills | Status: DC | PRN

## 2016-12-20 MED ORDER — IBUPROFEN 200 MG PO TABS
400.0000 mg | ORAL_TABLET | Freq: Once | ORAL | Status: AC
  Administered 2016-12-20: 400 mg via ORAL
  Filled 2016-12-20: qty 2

## 2016-12-20 NOTE — ED Triage Notes (Signed)
Patient reports that she was moving furniture yesterday and starting last night and today having left shoulder blade pain.  Esp worse with movement.

## 2017-01-04 NOTE — ED Provider Notes (Signed)
[facility] DEPT Provider Note   CSN: [id number] Arrival date & time: 12/20/16  1327     History   Chief Complaint Chief Complaint  Patient presents with  . Shoulder Pain  . Back Pain    HPI Elizabeth Calhoun is a 71 y.o. female.  HPI   71 year old female with left upper back pain.  Noticed this morning.  Progressively worsening throughout the day.  Patient was moving heavy furniture yesterday and last night.  Pain is in the left scapular region.  Worse with movement.  Minimal at rest.  No respiratory complaints.  No fevers or chills.  Past Medical History:  Diagnosis Date  . Adenomatous colon polyp   . Diabetes mellitus without complication ([place])   . Hemorrhoids   . Menopause   . OA (osteoarthritis)   . Obesity   . Other and unspecified hyperlipidemia   . Stroke [facility])     There are no active problems to display for this patient.   Past Surgical History:  Procedure Laterality Date  . SHOULDER SURGERY  2005    OB History    No data available       Home Medications    Prior to Admission medications   Medication Sig Start Date End Date Taking? Authorizing Provider  atorvastatin (LIPITOR) 80 MG tablet Take 80 mg by mouth at bedtime.  03/10/15  Yes [provider]  clopidogrel (PLAVIX) 75 MG tablet Take 75 mg by mouth daily. 11/23/16  Yes [provider]  hydrochlorothiazide (HYDRODIURIL) 25 MG tablet Take 25 mg by mouth daily. 10/09/16  Yes [provider]  JARDIANCE 25 MG TABS tablet Take 25 mg by mouth daily. 10/01/16  Yes [provider]  lisinopril (PRINIVIL,ZESTRIL) 10 MG tablet Take 10 mg by mouth daily.   Yes [provider]  metFORMIN (GLUCOPHAGE) 1000 MG tablet Take 1,000 mg by mouth 2 (two) times daily with a meal.   Yes [provider]  pioglitazone (ACTOS) 45 MG tablet Take 45 mg by mouth at bedtime. 10/01/16  Yes [provider]  rosuvastatin (CRESTOR) 40 MG  tablet Take 40 mg by mouth daily. 10/01/16  Yes [provider]  zolpidem (AMBIEN) 10 MG tablet Take 10 mg by mouth at bedtime.    Yes [provider]  ibuprofen (ADVIL,MOTRIN) 200 MG tablet Take 200-400 mg by mouth every 6 (six) hours as needed (pain).    [provider]  loratadine (CLARITIN) 10 MG tablet Take 1 tablet (10 mg total) by mouth daily. Patient not taking: Reported on 04/09/2014 05/07/13   Presson, Audelia Hives, PA  meclizine (ANTIVERT) 25 MG tablet Take 1 tablet (25 mg total) by mouth 3 (three) times daily as needed for dizziness. Patient not taking: Reported on 12/20/2016 03/12/15   Fredia Sorrow, MD  meloxicam (MOBIC) 7.5 MG tablet Take 1 tablet (7.5 mg total) by mouth 2 (two) times daily as needed for pain. Patient not taking: Reported on 12/20/2016 02/29/16   Leandrew Koyanagi, MD  meloxicam (MOBIC) 7.5 MG tablet Take 1 tablet (7.5 mg total) by mouth 2 (two) times daily as needed for pain. 12/20/16   Virgel Manifold, MD  methocarbamol (ROBAXIN) 500 MG tablet Take 1 tablet (500 mg total) by mouth every 6 (six) hours as needed for muscle spasms. Patient not taking: Reported on 12/20/2016 02/29/16   Leandrew Koyanagi, MD  predniSONE (STERAPRED UNI-PAK 21 TAB) 10 MG (21) TBPK tablet Take as directed Patient not taking:  Reported on 12/20/2016 02/29/16   Leandrew Koyanagi, MD  traMADol (ULTRAM) 50 MG tablet Take 1 tablet (50 mg total) by mouth every 6 (six) hours as needed. 12/20/16   Virgel Manifold, MD    Family History Family History  Problem Relation Age of Onset  . Heart attack Father   . Heart disease Brother        1/8  . Heart disease Brother        2/8  . Heart disease Brother        3/8  . Heart disease Brother        4/8  . Heart disease Brother        5/8  . Heart disease Brother        6/8  . Cancer Brother        7/8  . Seizures Brother        8/8  . Stroke Brother        8/8  . Heart disease Sister        1/6  . Alzheimer's disease Sister         2/6  . Diabetes Sister        3/6  . Hypertension Sister        3/6  . Hypertension Sister        4/6  . Diabetes Sister        4/6  . Diabetes Daughter     Social History Social History   Tobacco Use  . Smoking status: Never Smoker  . Smokeless tobacco: Never Used  Substance Use Topics  . Alcohol use: Yes    Comment: occasionally  . Drug use: No     Allergies   Aspirin   Review of Systems Review of Systems  All systems reviewed and negative, other than as noted in HPI.  Physical Exam Updated Vital Signs BP 122/75 (BP Location: Right Arm)   Pulse 87   Temp 98 F (36.7 C) (Oral)   Resp 19   SpO2 100%   Physical Exam  Constitutional: She appears well-developed and well-nourished. No distress.  HENT:  Head: Normocephalic and atraumatic.  Eyes: Conjunctivae are normal. Right eye exhibits no discharge. Left eye exhibits no discharge.  Neck: Neck supple.  Cardiovascular: Normal rate, regular rhythm and normal heart sounds. Exam reveals no gallop and no friction rub.  No murmur heard. Pulmonary/Chest: Effort normal and breath sounds normal. No respiratory distress.  Abdominal: Soft. She exhibits no distension. There is no tenderness.  Musculoskeletal: She exhibits no edema or tenderness.  Tenderness to palpation scapular region extending into the lateral neck.  No overlying skin changes.  Increased pain with range of motion of the shoulder.  Neurovascular intact distally.  Neurological: She is alert.  Skin: Skin is warm and dry.  Psychiatric: She has a normal mood and affect. Her behavior is normal. Thought content normal.  Nursing note and vitals reviewed.    ED Treatments / Results  Labs (all labs ordered are listed, but only abnormal results are displayed) Labs Reviewed - No data to display  EKG  EKG Interpretation None       Radiology No results found.  Procedures Procedures (including critical care time)  Medications Ordered in  ED Medications  ibuprofen (ADVIL,MOTRIN) tablet 400 mg (400 mg Oral Given 12/20/16 1644)  dexamethasone (DECADRON) tablet 8 mg (8 mg Oral Given 12/20/16 1644)  LORazepam (ATIVAN) tablet 0.5 mg (0.5 mg Oral Given 12/20/16 1644)  Initial Impression / Assessment and Plan / ED Course  I have reviewed the triage vital signs and the nursing notes.  Pertinent labs & imaging results that were available during my care of the patient were reviewed by me and considered in my medical decision making (see chart for details).     Left upper back pain gradual in onset progressively worsening a day and furniture.  Exam is consistent with muscular skeletal etiology.  Plan symptom medic treatment.  No red flags.  Final Clinical Impressions(s) / ED Diagnoses   Final diagnoses:  Upper back pain on left side    ED Discharge Orders        Ordered    meloxicam (MOBIC) 7.5 MG tablet  2 times daily PRN     12/20/16 1811    traMADol (ULTRAM) 50 MG tablet  Every 6 hours PRN     12/20/16 1811       Virgel Manifold, MD 01/04/17 1611

## 2017-03-01 DIAGNOSIS — M8580 Other specified disorders of bone density and structure, unspecified site: Secondary | ICD-10-CM | POA: Diagnosis not present

## 2017-03-01 DIAGNOSIS — I2510 Atherosclerotic heart disease of native coronary artery without angina pectoris: Secondary | ICD-10-CM | POA: Diagnosis not present

## 2017-03-01 DIAGNOSIS — I10 Essential (primary) hypertension: Secondary | ICD-10-CM | POA: Diagnosis not present

## 2017-03-01 DIAGNOSIS — E7800 Pure hypercholesterolemia, unspecified: Secondary | ICD-10-CM | POA: Diagnosis not present

## 2017-03-01 DIAGNOSIS — Z794 Long term (current) use of insulin: Secondary | ICD-10-CM | POA: Diagnosis not present

## 2017-03-01 DIAGNOSIS — E1165 Type 2 diabetes mellitus with hyperglycemia: Secondary | ICD-10-CM | POA: Diagnosis not present

## 2017-04-09 DIAGNOSIS — R079 Chest pain, unspecified: Secondary | ICD-10-CM | POA: Diagnosis not present

## 2017-04-19 DIAGNOSIS — H2513 Age-related nuclear cataract, bilateral: Secondary | ICD-10-CM | POA: Diagnosis not present

## 2017-04-19 DIAGNOSIS — E119 Type 2 diabetes mellitus without complications: Secondary | ICD-10-CM | POA: Diagnosis not present

## 2017-06-10 DIAGNOSIS — E7800 Pure hypercholesterolemia, unspecified: Secondary | ICD-10-CM | POA: Diagnosis not present

## 2017-06-10 DIAGNOSIS — Z6833 Body mass index (BMI) 33.0-33.9, adult: Secondary | ICD-10-CM | POA: Diagnosis not present

## 2017-06-10 DIAGNOSIS — Z794 Long term (current) use of insulin: Secondary | ICD-10-CM | POA: Diagnosis not present

## 2017-06-10 DIAGNOSIS — E663 Overweight: Secondary | ICD-10-CM | POA: Diagnosis not present

## 2017-06-10 DIAGNOSIS — I10 Essential (primary) hypertension: Secondary | ICD-10-CM | POA: Diagnosis not present

## 2017-06-10 DIAGNOSIS — R05 Cough: Secondary | ICD-10-CM | POA: Diagnosis not present

## 2017-06-10 DIAGNOSIS — E1169 Type 2 diabetes mellitus with other specified complication: Secondary | ICD-10-CM | POA: Diagnosis not present

## 2017-06-11 ENCOUNTER — Other Ambulatory Visit: Payer: Self-pay | Admitting: Internal Medicine

## 2017-06-11 DIAGNOSIS — Z1231 Encounter for screening mammogram for malignant neoplasm of breast: Secondary | ICD-10-CM

## 2017-06-12 ENCOUNTER — Emergency Department (HOSPITAL_COMMUNITY): Payer: Medicare Other

## 2017-06-12 ENCOUNTER — Emergency Department (HOSPITAL_COMMUNITY)
Admission: EM | Admit: 2017-06-12 | Discharge: 2017-06-12 | Disposition: A | Discharge status: Home / Self Care | Payer: Medicare Other | Attending: Emergency Medicine | Admitting: Emergency Medicine

## 2017-06-12 ENCOUNTER — Encounter (HOSPITAL_COMMUNITY): Payer: Self-pay | Admitting: Emergency Medicine

## 2017-06-12 DIAGNOSIS — Z79899 Other long term (current) drug therapy: Secondary | ICD-10-CM | POA: Diagnosis not present

## 2017-06-12 DIAGNOSIS — R091 Pleurisy: Secondary | ICD-10-CM

## 2017-06-12 DIAGNOSIS — R0781 Pleurodynia: Secondary | ICD-10-CM | POA: Diagnosis not present

## 2017-06-12 DIAGNOSIS — Z7902 Long term (current) use of antithrombotics/antiplatelets: Secondary | ICD-10-CM | POA: Diagnosis not present

## 2017-06-12 DIAGNOSIS — J209 Acute bronchitis, unspecified: Secondary | ICD-10-CM | POA: Insufficient documentation

## 2017-06-12 DIAGNOSIS — R05 Cough: Secondary | ICD-10-CM | POA: Diagnosis not present

## 2017-06-12 DIAGNOSIS — E119 Type 2 diabetes mellitus without complications: Secondary | ICD-10-CM | POA: Diagnosis not present

## 2017-06-12 LAB — URINALYSIS, ROUTINE W REFLEX MICROSCOPIC
Bacteria, UA: NONE SEEN
Bilirubin Urine: NEGATIVE
Hgb urine dipstick: NEGATIVE
Ketones, ur: 5 mg/dL — AB
Leukocytes, UA: NEGATIVE
Nitrite: NEGATIVE
PROTEIN: NEGATIVE mg/dL
Specific Gravity, Urine: 1.018 (ref 1.005–1.030)
pH: 5 (ref 5.0–8.0)

## 2017-06-12 LAB — CBC WITH DIFFERENTIAL/PLATELET
BASOS ABS: 0 10*3/uL (ref 0.0–0.1)
Basophils Relative: 0 %
Eosinophils Absolute: 0.2 10*3/uL (ref 0.0–0.7)
Eosinophils Relative: 3 %
HEMATOCRIT: 36.9 % (ref 36.0–46.0)
Hemoglobin: 11.6 g/dL — ABNORMAL LOW (ref 12.0–15.0)
LYMPHS PCT: 27 %
Lymphs Abs: 2 10*3/uL (ref 0.7–4.0)
MCH: 29.8 pg (ref 26.0–34.0)
MCHC: 31.4 g/dL (ref 30.0–36.0)
MCV: 94.9 fL (ref 78.0–100.0)
Monocytes Absolute: 0.5 10*3/uL (ref 0.1–1.0)
Monocytes Relative: 6 %
NEUTROS ABS: 4.9 10*3/uL (ref 1.7–7.7)
NEUTROS PCT: 64 %
PLATELETS: 256 10*3/uL (ref 150–400)
RBC: 3.89 MIL/uL (ref 3.87–5.11)
RDW: 14.5 % (ref 11.5–15.5)
WBC: 7.7 10*3/uL (ref 4.0–10.5)

## 2017-06-12 LAB — COMPREHENSIVE METABOLIC PANEL
ALT: 9 U/L — ABNORMAL LOW (ref 14–54)
AST: 16 U/L (ref 15–41)
Albumin: 4.3 g/dL (ref 3.5–5.0)
Alkaline Phosphatase: 69 U/L (ref 38–126)
Anion gap: 10 (ref 5–15)
BILIRUBIN TOTAL: 0.8 mg/dL (ref 0.3–1.2)
BUN: 18 mg/dL (ref 6–20)
CHLORIDE: 106 mmol/L (ref 101–111)
CO2: 26 mmol/L (ref 22–32)
CREATININE: 1.06 mg/dL — AB (ref 0.44–1.00)
Calcium: 9.6 mg/dL (ref 8.9–10.3)
GFR calc Af Amer: 59 mL/min — ABNORMAL LOW (ref >60)
GFR, EST NON AFRICAN AMERICAN: 51 mL/min — AB (ref >60)
Glucose, Bld: 94 mg/dL (ref 65–99)
Potassium: 3.7 mmol/L (ref 3.5–5.1)
Sodium: 142 mmol/L (ref 135–145)
Total Protein: 7.7 g/dL (ref 6.5–8.1)

## 2017-06-12 MED ORDER — AZITHROMYCIN 250 MG PO TABS: ORAL_TABLET | ORAL | 0 refills | Status: DC

## 2017-06-12 MED ORDER — KETOROLAC TROMETHAMINE 30 MG/ML IJ SOLN
30.0000 mg | Freq: Once | INTRAMUSCULAR | Status: AC
  Administered 2017-06-12: 30 mg via INTRAMUSCULAR
  Filled 2017-06-12: qty 1

## 2017-06-12 MED ORDER — AZITHROMYCIN 250 MG PO TABS
500.0000 mg | ORAL_TABLET | Freq: Once | ORAL | Status: AC
  Administered 2017-06-12: 500 mg via ORAL
  Filled 2017-06-12: qty 2

## 2017-06-12 NOTE — ED Triage Notes (Signed)
Patient here from home with complaints of dry cough x2 weeks. Left side rib pain when coughing.

## 2017-06-12 NOTE — ED Provider Notes (Signed)
[facility] DEPT Provider Note   CSN: [id number] Arrival date & time: 06/12/17  1543     History   Chief Complaint Chief Complaint  Patient presents with  . Cough    HPI Elizabeth Calhoun is a 72 y.o. female.  Pt presents to the ED today with cough and left sided rib pain.  The pt said she's had the cough for about 2 weeks.  She said the cough has been dry without any phlegm.  She has had left sided pain for the past week.  It hurts when she coughs, sneezes, moves.  No rash.  No sob.  Tylenol PM helps the cough.  She has taken no other meds.     Past Medical History:  Diagnosis Date  . Adenomatous colon polyp   . Diabetes mellitus without complication ([place])   . Hemorrhoids   . Menopause   . OA (osteoarthritis)   . Obesity   . Other and unspecified hyperlipidemia   . Stroke [facility])     There are no active problems to display for this patient.   Past Surgical History:  Procedure Laterality Date  . SHOULDER SURGERY  2005     OB History   None      Home Medications    Prior to Admission medications   Medication Sig Start Date End Date Taking? Authorizing Provider  BIOTIN PO Take 1 tablet by mouth daily.   Yes [provider]  clopidogrel (PLAVIX) 75 MG tablet Take 75 mg by mouth daily. 11/23/16  Yes [provider]  diphenhydrAMINE (BENADRYL) 25 mg capsule Take 25 mg by mouth daily.   Yes [provider]  diphenhydramine-acetaminophen (TYLENOL PM) 25-500 MG TABS tablet Take 1 tablet by mouth at bedtime as needed (sleep).   Yes [provider]  hydrochlorothiazide (HYDRODIURIL) 25 MG tablet Take 25 mg by mouth daily. 10/09/16  Yes [provider]  insulin lispro (HUMALOG) 100 UNIT/ML injection Inject 10 Units into the skin at bedtime.   Yes [provider]  JARDIANCE 25 MG TABS tablet Take 25 mg by mouth daily. 10/01/16  Yes [provider]  lisinopril (PRINIVIL,ZESTRIL) 10  MG tablet Take 10 mg by mouth daily.   Yes [provider]  metFORMIN (GLUCOPHAGE) 1000 MG tablet Take 1,000 mg by mouth 2 (two) times daily with a meal.   Yes [provider]  pioglitazone (ACTOS) 45 MG tablet Take 45 mg by mouth at bedtime. 10/01/16  Yes [provider]  rosuvastatin (CRESTOR) 40 MG tablet Take 40 mg by mouth daily. 10/01/16  Yes [provider]  zolpidem (AMBIEN) 10 MG tablet Take 10 mg by mouth at bedtime.    Yes [provider]  azithromycin (ZITHROMAX) 250 MG tablet Take 1 tablet daily 06/13/17   Isla Pence, MD  loratadine (CLARITIN) 10 MG tablet Take 1 tablet (10 mg total) by mouth daily. Patient not taking: Reported on 04/09/2014 05/07/13   Presson, Audelia Hives, PA  meclizine (ANTIVERT) 25 MG tablet Take 1 tablet (25 mg total) by mouth 3 (three) times daily as needed for dizziness. Patient not taking: Reported on 12/20/2016 03/12/15   Fredia Sorrow, MD  meloxicam (MOBIC) 7.5 MG tablet Take 1 tablet (7.5 mg total) by mouth 2 (two) times daily as needed for pain. Patient not taking: Reported on 12/20/2016 02/29/16   Leandrew Koyanagi, MD  meloxicam (MOBIC) 7.5 MG tablet Take 1 tablet (7.5 mg total) by mouth 2 (two) times  daily as needed for pain. Patient not taking: Reported on 06/12/2017 12/20/16   Virgel Manifold, MD  methocarbamol (ROBAXIN) 500 MG tablet Take 1 tablet (500 mg total) by mouth every 6 (six) hours as needed for muscle spasms. Patient not taking: Reported on 12/20/2016 02/29/16   Leandrew Koyanagi, MD  predniSONE (STERAPRED UNI-PAK 21 TAB) 10 MG (21) TBPK tablet Take as directed Patient not taking: Reported on 12/20/2016 02/29/16   Leandrew Koyanagi, MD  traMADol (ULTRAM) 50 MG tablet Take 1 tablet (50 mg total) by mouth every 6 (six) hours as needed. Patient not taking: Reported on 06/12/2017 12/20/16   Virgel Manifold, MD    Family History Family History  Problem Relation Age of Onset  . Heart attack Father   . Heart disease  Brother        1/8  . Heart disease Brother        2/8  . Heart disease Brother        3/8  . Heart disease Brother        4/8  . Heart disease Brother        5/8  . Heart disease Brother        6/8  . Cancer Brother        7/8  . Seizures Brother        8/8  . Stroke Brother        8/8  . Heart disease Sister        1/6  . Alzheimer's disease Sister        2/6  . Diabetes Sister        3/6  . Hypertension Sister        3/6  . Hypertension Sister        4/6  . Diabetes Sister        4/6  . Diabetes Daughter     Social History Social History   Tobacco Use  . Smoking status: Never Smoker  . Smokeless tobacco: Never Used  Substance Use Topics  . Alcohol use: Yes    Comment: occasionally  . Drug use: No     Allergies   Aspirin   Review of Systems Review of Systems  Respiratory: Positive for cough.   Musculoskeletal:       Left chest wall pain  All other systems reviewed and are negative.    Physical Exam Updated Vital Signs BP (!) 153/81   Pulse 73   Temp 98.6 F (37 C) (Oral)   Resp (!) 21   Wt 74.4 kg (164 lb)   SpO2 99%   BMI 32.03 kg/m   Physical Exam  Constitutional: She is oriented to person, place, and time. She appears well-developed and well-nourished.  HENT:  Head: Normocephalic and atraumatic.  Right Ear: External ear normal.  Left Ear: External ear normal.  Nose: Nose normal.  Mouth/Throat: Oropharynx is clear and moist.  Eyes: Pupils are equal, round, and reactive to light. Conjunctivae and EOM are normal.  Neck: Normal range of motion. Neck supple.  Cardiovascular: Normal rate, regular rhythm, normal heart sounds and intact distal pulses.  Pulmonary/Chest: Effort normal and breath sounds normal.  Abdominal: Soft. Bowel sounds are normal.  Musculoskeletal:  Left chest wall tenderness  Neurological: She is alert and oriented to person, place, and time.  Skin: Skin is warm. Capillary refill takes less than 2 seconds.    Psychiatric: She has a normal mood and affect. Her behavior is normal. Judgment  and thought content normal.  Nursing note and vitals reviewed.    ED Treatments / Results  Labs (all labs ordered are listed, but only abnormal results are displayed) Labs Reviewed  CBC WITH DIFFERENTIAL/PLATELET - Abnormal; Notable for the following components:      Result Value   Hemoglobin 11.6 (*)    All other components within normal limits  COMPREHENSIVE METABOLIC PANEL - Abnormal; Notable for the following components:   Creatinine, Ser 1.06 (*)    ALT 9 (*)    GFR calc non Af Amer 51 (*)    GFR calc Af Amer 59 (*)    All other components within normal limits  URINALYSIS, ROUTINE W REFLEX MICROSCOPIC - Abnormal; Notable for the following components:   Glucose, UA >=500 (*)    Ketones, ur 5 (*)    All other components within normal limits    EKG None  Radiology Dg Chest 2 View  Result Date: 06/12/2017 CLINICAL DATA:  Cough EXAM: CHEST - 2 VIEW COMPARISON:  12/20/2016 FINDINGS: Heart is upper limits normal in size. Bibasilar scarring. No acute confluent airspace opacities or effusions. No acute bony abnormality. IMPRESSION: No active cardiopulmonary disease. Electronically Signed   By: Rolm Baptise M.D.   On: 06/12/2017 18:31    Procedures Procedures (including critical care time)  Medications Ordered in ED Medications  ketorolac (TORADOL) 30 MG/ML injection 30 mg (30 mg Intramuscular Given 06/12/17 2104)  azithromycin (ZITHROMAX) tablet 500 mg (500 mg Oral Given 06/12/17 2104)     Initial Impression / Assessment and Plan / ED Course  I have reviewed the triage vital signs and the nursing notes.  Pertinent labs & imaging results that were available during my care of the patient were reviewed by me and considered in my medical decision making (see chart for details).     Pt is feeling much better after toradol.  As cough has been going on for 2 weeks, I will treat with zithromax.  She is  given her first dose here.  She knows to return if worse.  Final Clinical Impressions(s) / ED Diagnoses   Final diagnoses:  Acute bronchitis, unspecified organism  Pleurisy    ED Discharge Orders        Ordered    azithromycin (ZITHROMAX) 250 MG tablet     06/12/17 2219       Isla Pence, MD 06/12/17 2223

## 2017-07-04 ENCOUNTER — Ambulatory Visit
Admission: RE | Admit: 2017-07-04 | Discharge: 2017-07-04 | Disposition: A | Discharge status: Home / Self Care | Payer: Medicare Other | Source: Ambulatory Visit | Attending: Internal Medicine | Admitting: Internal Medicine

## 2017-07-04 DIAGNOSIS — Z1231 Encounter for screening mammogram for malignant neoplasm of breast: Secondary | ICD-10-CM

## 2017-08-27 DIAGNOSIS — I10 Essential (primary) hypertension: Secondary | ICD-10-CM | POA: Diagnosis not present

## 2017-08-27 DIAGNOSIS — Z7984 Long term (current) use of oral hypoglycemic drugs: Secondary | ICD-10-CM | POA: Diagnosis not present

## 2017-08-27 DIAGNOSIS — E119 Type 2 diabetes mellitus without complications: Secondary | ICD-10-CM | POA: Diagnosis not present

## 2017-08-27 DIAGNOSIS — I6930 Unspecified sequelae of cerebral infarction: Secondary | ICD-10-CM | POA: Diagnosis not present

## 2017-08-27 DIAGNOSIS — R05 Cough: Secondary | ICD-10-CM | POA: Diagnosis not present

## 2017-09-04 ENCOUNTER — Ambulatory Visit (INDEPENDENT_AMBULATORY_CARE_PROVIDER_SITE_OTHER): Payer: Medicare Other | Admitting: Orthopedic Surgery

## 2017-10-01 ENCOUNTER — Encounter (INDEPENDENT_AMBULATORY_CARE_PROVIDER_SITE_OTHER): Payer: Self-pay | Admitting: Family Medicine

## 2017-10-01 ENCOUNTER — Ambulatory Visit (INDEPENDENT_AMBULATORY_CARE_PROVIDER_SITE_OTHER): Payer: Medicare Other | Admitting: Family Medicine

## 2017-10-01 DIAGNOSIS — M79605 Pain in left leg: Secondary | ICD-10-CM

## 2017-10-01 MED ORDER — METHOCARBAMOL 500 MG PO TABS: 500.0000 mg | ORAL_TABLET | Freq: Four times a day (QID) | ORAL | 2 refills | Status: DC | PRN

## 2017-10-01 MED ORDER — MELOXICAM 7.5 MG PO TABS: 7.5000 mg | ORAL_TABLET | Freq: Two times a day (BID) | ORAL | 2 refills | Status: DC | PRN

## 2017-10-01 NOTE — Progress Notes (Signed)
  Elizabeth Calhoun - 72 y.o. female MRN [id number]  Date of birth: 1945/10/24    SUBJECTIVE:      Chief Complaint:/ HPI:  72 year old female with about 1 week of 3/10 left leg pain.  Pain initially started with the left foot but now she complains of left lower leg pain.  Initially she was having pain in her foot, difficulty moving the ankle and toes, and difficulty walking.  Today she reports she is able to do all these things with no pain.  She localizes her pain to the proximal calf and somewhat posterior knee.  She is unable to reproduce any of the pain today.  She is uncertain exactly what exacerbates her pain.  She knowledges history of arthritis in that left knee.  Overall, her pain is improving.  She continues to have some swelling in the left foot which is increased within the day and resolves with elevation.  She has been taking Tylenol which states it is not helping.  No numbness or tingling in the lower extremity.  No bruising or erythema.   ROS:     See HPI  PERTINENT  PMH / PSH FH / / SH:  Past Medical, Surgical, Social, and Family History Reviewed & Updated in the EMR.  Pertinent findings include:  Diabetes, history of stroke  OBJECTIVE: There were no vitals taken for this visit.  Physical Exam:  Vital signs are reviewed.  GEN: Alert and oriented, NAD Pulm: Breathing unlabored PSY: normal mood, congruent affect  MSK: Left knee: - Inspection: no gross deformity.  Mild effusion.  No erythema or bruising. Skin intact - Palpation: no TTP - ROM: Slightly decreased knee extension.  No pain with movement of the hip - Strength: 5/5 strength - Neuro/vasc: NV intact - Special Tests: - LIGAMENTS:negative Lachman's, no MCL or LCL laxity  -- MENISCUS: negative McMurray's, negative Thessaly   Left ankle: - Inspection: No obvious deformity, erythema, swelling, or ecchymosis - Palpation: No TTP over bony or tendinous or ligamentous landmarks,  - Strength: Normal strength with  dorsiflexion, plantarflexion, inversion, and eversion.  Nonfocal pain with resisted eversion - ROM: Full ROM - Neuro/vasc: NV intact - Special Tests: Negative anterior drawer.  Negative syndesmotic compression.    ASSESSMENT & PLAN:  1.  Nonspecific left leg pain.  Her pain is poorly localized and not reliably reproduced with physical exam.  This may be pain due to arthritis.  Overall it is improving.  Her pain does not seem to be tenderness or neurologic. - We will treat with Robaxin and a short course of meloxicam. -Continue to elevate feet -Recommended wearing supportive shoes - Follow-up as needed

## 2017-10-01 NOTE — Progress Notes (Signed)
I saw and examined the patient with Dr. Okey Dupre and agree with assessment and plan as outlined.  Pain better today.  Has left knee effusion, some difficulty extending.  Use meloxicam sparingly.  If pain recurs, could aspirate/inject left knee.

## 2017-10-11 DIAGNOSIS — E7800 Pure hypercholesterolemia, unspecified: Secondary | ICD-10-CM | POA: Diagnosis not present

## 2017-10-11 DIAGNOSIS — Z794 Long term (current) use of insulin: Secondary | ICD-10-CM | POA: Diagnosis not present

## 2017-10-11 DIAGNOSIS — R05 Cough: Secondary | ICD-10-CM | POA: Diagnosis not present

## 2017-10-11 DIAGNOSIS — E1169 Type 2 diabetes mellitus with other specified complication: Secondary | ICD-10-CM | POA: Diagnosis not present

## 2017-10-11 DIAGNOSIS — Z23 Encounter for immunization: Secondary | ICD-10-CM | POA: Diagnosis not present

## 2017-10-11 DIAGNOSIS — I10 Essential (primary) hypertension: Secondary | ICD-10-CM | POA: Diagnosis not present

## 2017-10-11 DIAGNOSIS — E663 Overweight: Secondary | ICD-10-CM | POA: Diagnosis not present

## 2017-10-22 DIAGNOSIS — E119 Type 2 diabetes mellitus without complications: Secondary | ICD-10-CM | POA: Diagnosis not present

## 2017-10-22 DIAGNOSIS — G8929 Other chronic pain: Secondary | ICD-10-CM | POA: Diagnosis not present

## 2017-10-22 DIAGNOSIS — M5442 Lumbago with sciatica, left side: Secondary | ICD-10-CM | POA: Diagnosis not present

## 2017-10-22 DIAGNOSIS — H2513 Age-related nuclear cataract, bilateral: Secondary | ICD-10-CM | POA: Diagnosis not present

## 2017-11-04 ENCOUNTER — Encounter (INDEPENDENT_AMBULATORY_CARE_PROVIDER_SITE_OTHER): Payer: Self-pay | Admitting: Orthopedic Surgery

## 2017-11-04 ENCOUNTER — Ambulatory Visit (INDEPENDENT_AMBULATORY_CARE_PROVIDER_SITE_OTHER): Payer: Medicare Other | Admitting: Orthopedic Surgery

## 2017-11-04 DIAGNOSIS — M5442 Lumbago with sciatica, left side: Secondary | ICD-10-CM | POA: Diagnosis not present

## 2017-11-04 DIAGNOSIS — M5441 Lumbago with sciatica, right side: Secondary | ICD-10-CM

## 2017-11-04 NOTE — Progress Notes (Signed)
Office Visit Note   Patient: Elizabeth Calhoun           Date of Birth: July 13, 1945           MRN: [id number] Visit Date: 11/04/2017 Requested by: Seward Carol, MD [address] [place] [address] [place], [place] [address] PCP: Seward Carol, MD  Subjective: Chief Complaint  Patient presents with  . Lower Back - Pain    HPI: Patient presents for evaluation of left-sided pain in the back and low left leg.  Been ongoing for months.  Denies any numbness and tingling.  She does report some popping in the left leg.  The pain does wake her from sleep at night.  She is tried Mobic and Robaxin without relief.  She did have an MRI scan in 2015 which showed both right and left-sided nerve root impingement at multiple levels.  She reports "bad pain" going up and down the left leg into the foot and ankle.  Is hard for her to walk and she reports diminished walking endurance.  She is not using a walker.  She is here with her 2 daughters today.              ROS: All systems reviewed are negative as they relate to the chief complaint within the history of present illness.  Patient denies  fevers or chills.   Assessment & Plan: Visit Diagnoses:  1. Low back pain with bilateral sciatica, unspecified back pain laterality, unspecified chronicity     Plan: Impression is low back pain with left-sided radiculopathy.  Plan is repeat MRI scan and she needs to begin considering whether or not she wants to proceed with any type of injection intervention or surgical intervention depending on what it shows.  I think is likely to show progression of degenerative arthritis in the spine along with worsening nerve root compression.  No weakness on exam today  Follow-Up Instructions: Return for after MRI.   Orders:  Orders Placed This Encounter  Procedures  . MR Lumbar Spine w/o contrast   No orders of the defined types were placed in this encounter.     Procedures: No procedures performed   Clinical  Data: No additional findings.  Objective: Vital Signs: There were no vitals taken for this visit.  Physical Exam:   Constitutional: Patient appears well-developed HEENT:  Head: Normocephalic Eyes:EOM are normal Neck: Normal range of motion Cardiovascular: Normal rate Pulmonary/chest: Effort normal Neurologic: Patient is alert Skin: Skin is warm Psychiatric: Patient has normal mood and affect    Ortho Exam: Orthopedic exam demonstrates 5 out of 5 ankle dorsiflexion plantarflexion quad and hamstring strength with palpable pedal pulses and nerve root tension signs positive on the left negative on the right.  No groin pain with internal/external rotation of the leg.  No paresthesias L1 S1 bilaterally.  No rashes in the back region.  She does have no muscle atrophy.  Good cervical spine range of motion and motor sensory function bilateral upper extremities is symmetric and intact with no muscle atrophy.  Symmetric reflexes biceps triceps patella and Achilles present.  Specialty Comments:  No specialty comments available.  Imaging: No results found.   PMFS History: There are no active problems to display for this patient.  Past Medical History:  Diagnosis Date  . Adenomatous colon polyp   . Diabetes mellitus without complication ([place])   . Hemorrhoids   . Menopause   . OA (osteoarthritis)   . Obesity   . Other and  unspecified hyperlipidemia   . Stroke [facility])     Family History  Problem Relation Age of Onset  . Heart attack Father   . Heart disease Brother        1/8  . Heart disease Brother        2/8  . Heart disease Brother        3/8  . Heart disease Brother        4/8  . Heart disease Brother        5/8  . Heart disease Brother        6/8  . Cancer Brother        7/8  . Seizures Brother        8/8  . Stroke Brother        8/8  . Heart disease Sister        1/6  . Alzheimer's disease Sister        2/6  . Diabetes Sister        3/6  . Hypertension  Sister        3/6  . Hypertension Sister        4/6  . Diabetes Sister        4/6  . Diabetes Daughter     Past Surgical History:  Procedure Laterality Date  . SHOULDER SURGERY  2005   Social History   Occupational History  . Occupation: works at [employer]  . Smoking status: Never Smoker  . Smokeless tobacco: Never Used  Substance and Sexual Activity  . Alcohol use: Yes    Comment: occasionally  . Drug use: No  . Sexual activity: Not on file

## 2017-11-15 ENCOUNTER — Ambulatory Visit
Admission: RE | Admit: 2017-11-15 | Discharge: 2017-11-15 | Disposition: A | Discharge status: Home / Self Care | Payer: Medicare Other | Source: Ambulatory Visit | Attending: Orthopedic Surgery | Admitting: Orthopedic Surgery

## 2017-11-15 ENCOUNTER — Other Ambulatory Visit (INDEPENDENT_AMBULATORY_CARE_PROVIDER_SITE_OTHER): Payer: Self-pay | Admitting: Orthopedic Surgery

## 2017-11-15 DIAGNOSIS — M5442 Lumbago with sciatica, left side: Principal | ICD-10-CM

## 2017-11-15 DIAGNOSIS — M5441 Lumbago with sciatica, right side: Secondary | ICD-10-CM

## 2017-11-15 DIAGNOSIS — M48061 Spinal stenosis, lumbar region without neurogenic claudication: Secondary | ICD-10-CM | POA: Diagnosis not present

## 2017-11-15 MED ORDER — GADOBENATE DIMEGLUMINE 529 MG/ML IV SOLN
15.0000 mL | Freq: Once | INTRAVENOUS | Status: AC | PRN
  Administered 2017-11-15: 15 mL via INTRAVENOUS

## 2017-11-21 ENCOUNTER — Ambulatory Visit (INDEPENDENT_AMBULATORY_CARE_PROVIDER_SITE_OTHER): Payer: Medicare Other | Admitting: Orthopedic Surgery

## 2017-11-21 DIAGNOSIS — M5442 Lumbago with sciatica, left side: Secondary | ICD-10-CM

## 2017-11-21 DIAGNOSIS — M5441 Lumbago with sciatica, right side: Secondary | ICD-10-CM

## 2017-11-22 ENCOUNTER — Encounter (INDEPENDENT_AMBULATORY_CARE_PROVIDER_SITE_OTHER): Payer: Self-pay | Admitting: Orthopedic Surgery

## 2017-11-22 NOTE — Progress Notes (Signed)
Office Visit Note   Patient: Elizabeth Calhoun           Date of Birth: [birth year]           MRN: [id number] Visit Date: 11/21/2017 Requested by: Seward Carol, MD [address] [place] [address] [place], [place] [address] PCP: Seward Carol, MD  Subjective: Chief Complaint  Patient presents with  . Lower Back - Pain    HPI: Patient presents for follow-up of MRI scan lumbar spine.  States that the pain comes and goes.  She is here with her daughters today.  She reports primarily left-sided involvement.  MRI scan does show metastatic cancer lesions the most significant one involving the L2 vertebral body.  She does report one episode of nausea.              ROS: All systems reviewed are negative as they relate to the chief complaint within the history of present illness.  Patient denies  fevers or chills.   Assessment & Plan: Visit Diagnoses:  1. Low back pain with bilateral sciatica, unspecified back pain laterality, unspecified chronicity     Plan: Impression is metastatic cancer involving the lumbar spine.  Plan is that I discussed this case with the oncologist on-call and they will see her within the next 1 to 2 days.  She does describe having some nausea event which is likely related to this problem.  I do not think there is any intervention required at this time.  I will see her back as needed.  Follow-Up Instructions: Return if symptoms worsen or fail to improve.   Orders:  No orders of the defined types were placed in this encounter.  No orders of the defined types were placed in this encounter.     Procedures: No procedures performed   Clinical Data: No additional findings.  Objective: Vital Signs: There were no vitals taken for this visit.  Physical Exam:   Constitutional: Patient appears well-developed HEENT:  Head: Normocephalic Eyes:EOM are normal Neck: Normal range of motion Cardiovascular: Normal rate Pulmonary/chest: Effort normal Neurologic:  Patient is alert Skin: Skin is warm Psychiatric: Patient has normal mood and affect    Ortho Exam: Ortho exam demonstrates good ankle dorsiflexion plantarflexion quad hamstring strength with no paresthesias L1 S1 bilaterally.  Hip flexion strength also 5+ out of 5 bilaterally.  No groin pain with internal and external rotation of the leg.  Specialty Comments:  No specialty comments available.  Imaging: No results found.   PMFS History: There are no active problems to display for this patient.  Past Medical History:  Diagnosis Date  . Adenomatous colon polyp   . Diabetes mellitus without complication ([place])   . Hemorrhoids   . Menopause   . OA (osteoarthritis)   . Obesity   . Other and unspecified hyperlipidemia   . Stroke [facility])     Family History  Problem Relation Age of Onset  . Heart attack Father   . Heart disease Brother        1/8  . Heart disease Brother        2/8  . Heart disease Brother        3/8  . Heart disease Brother        4/8  . Heart disease Brother        5/8  . Heart disease Brother        6/8  . Cancer Brother        7/8  .  Seizures Brother        8/8  . Stroke Brother        8/8  . Heart disease Sister        1/6  . Alzheimer's disease Sister        2/6  . Diabetes Sister        3/6  . Hypertension Sister        3/6  . Hypertension Sister        4/6  . Diabetes Sister        4/6  . Diabetes Daughter     Past Surgical History:  Procedure Laterality Date  . SHOULDER SURGERY  2005   Social History   Occupational History  . Occupation: works at [employer]  . Smoking status: Never Smoker  . Smokeless tobacco: Never Used  Substance and Sexual Activity  . Alcohol use: Yes    Comment: occasionally  . Drug use: No  . Sexual activity: Not on file

## 2017-11-25 ENCOUNTER — Telehealth (INDEPENDENT_AMBULATORY_CARE_PROVIDER_SITE_OTHER): Payer: Self-pay | Admitting: Orthopedic Surgery

## 2017-11-25 NOTE — Telephone Encounter (Signed)
[facility]. LMVM for them to call me back to discuss. Dr Marlou Sa spoke with on call physician on Thursday and was told that someone from the facility would reach out to patient by Friday. I asked them to call me back to let me know status of this. I called patient as well to give her update and she stated when she called the [facility] herself they told her they did not know anything about the referral. Will wait for return call from referral coordinator since Dr Marlou Sa spoke directly with Dr Alen Blew on Thursday

## 2017-11-25 NOTE — Telephone Encounter (Signed)
Patient called wanted to check on the status of her referral to the cancer center

## 2017-11-26 NOTE — Telephone Encounter (Signed)
Got response from [facility], they are working on trying to get patient an appointment for this week. They will let us know.

## 2017-11-27 ENCOUNTER — Telehealth: Payer: Self-pay | Admitting: Hematology

## 2017-11-27 NOTE — Progress Notes (Signed)
HEMATOLOGY/ONCOLOGY CONSULTATION NOTE  Date of Service: 11/28/2017  Patient Care Team: Seward Carol, MD as PCP - General (Internal Medicine)  CHIEF COMPLAINTS/PURPOSE OF CONSULTATION:  Metastatic Disease of Unknown Primary  HISTORY OF PRESENTING ILLNESS:   Elizabeth Calhoun is a wonderful 72 y.o. female who has been referred to [place] by Dr. Seward Carol  for evaluation and management of metastatic lesions. She is accompanied today by her two daughters and her niece. The pt reports that she is doing well overall.   The pt notes that two years ago she developed lower back pain which has presented intermittently until 6 months ago when it presented unceasingly. She notes lower back pain and left lower extremity pain, however the left leg pain is not constant but does endorse positional exacerbation. She also describes a sensation of pins and needles all over her back that presented episodically twice, one week apart. The pt has used a back brace occasionally which has helped reduce her pain.   The pt also developed a cough about 6 months ago and was switched off her BP medicationn (ACEI). She notes that her cough catches her off guard. She received Z pack and Toradol as well. She notes that she coughed up streaks of bright red blood two weeks ago, on one occasion. She denies any headaches, changes in speech, or changes in vision. The pt notes that she has had some abdominal pain when bending over, and has occasional diarrhea which she attributes to Metformin. She denies any blood in the stools or black stools. She notes that her last colonoscopy was 6-7 years ago and revealed only polyps.   She denies any vaginal bleeding or discharge. The pt had a partial hysterectomy in 1990 for fibroids. Her last mammogram was negative and was completed on 07/04/17.   The pt notes that she has had some loss of appetite and has lost 20 pounds, possibly more, in the last weeks to couple months. She notes that  she feels cold more often than she used to.   The pt has no current limitations, noting that she lives at home independently and is able to keep up with her chores and all of her daily activities.   She notes that she had a stroke in 2011, which occurred in her sleep. She had weakness in her right upper extremity at the time, which improved and normalized in the subsequent years after PT. She denies any remaining limitations. She continues on Plavix.   Of note prior to the patient's visit today, pt has had MRI Lumbar completed on 11/15/17 with results revealing Widespread marrow lesions consistent with metastatic disease. 2. Pathologic endplate fractures at L2. 3. Concerning left leg radiculopathy, there is far-lateral tumor infiltration at L2 which could affect extraforaminal nerve roots. No epidural tumor extension.  Most recent lab results (06/12/17) of CBC w/diff and CMP is as follows: all values are WNL except for HGB at 11.6, Creatinine at 1.06, ALT at 9, GFR at 51.  On review of systems, pt reports weaker appetite, cough, one episode of coughing blood, positional abdominal pain, left leg pain, lower back pain, 20 pounds weight loss, and denies vaginal bleeding, abnormal vaginal discharge, blood in the stools, black stools, headaches, and any other symtoms.   On PMHx the pt reports well controlled HTN, arthritis, hyperlipidemia, stroke in 2011, partial hysterectomy in 1990, 2005 left shoulder surgery, arthroscopic right knee surgery in 1996. On Social Hx the pt denies ever smoking, and hasn't had  alcohol in 15 years, previously social ETOH consumption  On Family Hx the pt reports CAD, mother with colon cancer above 18 y/o, brother with stomach cancer, and denies other cancer.   MEDICAL HISTORY:  Past Medical History:  Diagnosis Date  . Adenomatous colon polyp   . Diabetes mellitus without complication ([place])   . Hemorrhoids   . Menopause   . OA (osteoarthritis)   . Obesity   . Other and  unspecified hyperlipidemia   . Stroke [facility])     SURGICAL HISTORY: Past Surgical History:  Procedure Laterality Date  . SHOULDER SURGERY  2005    SOCIAL HISTORY: Social History   Socioeconomic History  . Marital status: Married    Spouse name: Not on file  . Number of children: 3  . Years of education: Not on file  . Highest education level: Not on file  Occupational History  . Occupation: works at [employer]  . Financial resource strain: Not on file  . Food insecurity:    Worry: Not on file    Inability: Not on file  . Transportation needs:    Medical: Not on file    Non-medical: Not on file  Tobacco Use  . Smoking status: Never Smoker  . Smokeless tobacco: Never Used  Substance and Sexual Activity  . Alcohol use: Yes    Comment: occasionally  . Drug use: No  . Sexual activity: Not on file  Lifestyle  . Physical activity:    Days per week: Not on file    Minutes per session: Not on file  . Stress: Not on file  Relationships  . Social connections:    Talks on phone: Not on file    Gets together: Not on file    Attends religious service: Not on file    Active member of club or organization: Not on file    Attends meetings of clubs or organizations: Not on file    Relationship status: Not on file  . Intimate partner violence:    Fear of current or ex partner: Not on file    Emotionally abused: Not on file    Physically abused: Not on file    Forced sexual activity: Not on file  Other Topics Concern  . Not on file  Social History Narrative  . Not on file    FAMILY HISTORY: Family History  Problem Relation Age of Onset  . Heart attack Father   . Heart disease Brother        1/8  . Heart disease Brother        2/8  . Heart disease Brother        3/8  . Heart disease Brother        4/8  . Heart disease Brother        5/8  . Heart disease Brother        6/8  . Cancer Brother        7/8  . Seizures Brother        8/8  . Stroke Brother          8/8  . Heart disease Sister        1/6  . Alzheimer's disease Sister        2/6  . Diabetes Sister        3/6  . Hypertension Sister        3/6  . Hypertension Sister        4/6  .  Diabetes Sister        4/6  . Diabetes Daughter     ALLERGIES:  is allergic to aspirin.  MEDICATIONS:  Current Outpatient Medications  Medication Sig Dispense Refill  . loratadine (CLARITIN) 10 MG tablet Take 1 tablet (10 mg total) by mouth daily. 30 tablet 1  . losartan (COZAAR) 25 MG tablet Take 25 mg by mouth daily.    . meclizine (ANTIVERT) 25 MG tablet Take 1 tablet (25 mg total) by mouth 3 (three) times daily as needed for dizziness. 30 tablet 0  . azithromycin (ZITHROMAX) 250 MG tablet Take 1 tablet daily (Patient not taking: Reported on 11/28/2017) 4 each 0  . BIOTIN PO Take 1 tablet by mouth daily.    . clopidogrel (PLAVIX) 75 MG tablet Take 75 mg by mouth daily.  0  . diphenhydrAMINE (BENADRYL) 25 mg capsule Take 25 mg by mouth daily.    . diphenhydramine-acetaminophen (TYLENOL PM) 25-500 MG TABS tablet Take 1 tablet by mouth at bedtime as needed (sleep).    . hydrochlorothiazide (HYDRODIURIL) 25 MG tablet Take 25 mg by mouth daily.  0  . insulin lispro (HUMALOG) 100 UNIT/ML injection Inject 10 Units into the skin at bedtime.    Marland Kitchen JARDIANCE 25 MG TABS tablet Take 25 mg by mouth daily.  0  . lisinopril (PRINIVIL,ZESTRIL) 10 MG tablet Take 10 mg by mouth daily.    . meloxicam (MOBIC) 7.5 MG tablet Take 1 tablet (7.5 mg total) by mouth 2 (two) times daily as needed for pain. (Patient not taking: Reported on 06/12/2017) 10 tablet 0  . meloxicam (MOBIC) 7.5 MG tablet Take 1 tablet (7.5 mg total) by mouth 2 (two) times daily as needed for pain. (Patient not taking: Reported on 11/28/2017) 30 tablet 2  . metFORMIN (GLUCOPHAGE) 1000 MG tablet Take 1,000 mg by mouth 2 (two) times daily with a meal.    . methocarbamol (ROBAXIN) 500 MG tablet Take 1 tablet (500 mg total) by mouth every 6 (six) hours  as needed for muscle spasms. (Patient not taking: Reported on 11/28/2017) 30 tablet 2  . pioglitazone (ACTOS) 45 MG tablet Take 45 mg by mouth at bedtime.  0  . predniSONE (STERAPRED UNI-PAK 21 TAB) 10 MG (21) TBPK tablet Take as directed (Patient not taking: Reported on 12/20/2016) 21 tablet 0  . rosuvastatin (CRESTOR) 40 MG tablet Take 40 mg by mouth daily.  0  . traMADol (ULTRAM) 50 MG tablet Take 1 tablet (50 mg total) by mouth every 6 (six) hours as needed. (Patient not taking: Reported on 06/12/2017) 10 tablet 0  . zolpidem (AMBIEN) 10 MG tablet Take 10 mg by mouth at bedtime.      No current facility-administered medications for this visit.     REVIEW OF SYSTEMS:    10 Point review of Systems was done is negative except as noted above.  PHYSICAL EXAMINATION: ECOG PERFORMANCE STATUS: 2 - Symptomatic, <50% confined to bed  . Vitals:   11/28/17 1333  BP: (!) 164/93  Pulse: 84  Resp: 18  Temp: 98.4 F (36.9 C)  SpO2: 94%   Filed Weights   11/28/17 1333  Weight: 152 lb 1.6 oz (69 kg)   .Body mass index is 29.7 kg/m.  GENERAL:alert, in no acute distress and comfortable SKIN: no acute rashes, no significant lesions EYES: conjunctiva are pink and non-injected, sclera anicteric OROPHARYNX: MMM, no exudates, no oropharyngeal erythema or ulceration NECK: supple, no JVD LYMPH:  no palpable lymphadenopathy in the  cervical, axillary or inguinal regions LUNGS: clear to auscultation b/l with normal respiratory effort HEART: regular rate & rhythm ABDOMEN:  normoactive bowel sounds , non tender, not distended. Extremity: no pedal edema PSYCH: alert & oriented x 3 with fluent speech NEURO: no focal motor/sensory deficits  LABORATORY DATA:  I have reviewed the data as listed  . CBC Latest Ref Rng & Units 12/04/2017 11/28/2017 06/12/2017  WBC 4.0 - 10.5 K/uL 8.8 10.4 7.7  Hemoglobin 12.0 - 15.0 g/dL 11.1(L) 11.3(L) 11.6(L)  Hematocrit 36.0 - 46.0 % 36.2 36.0 36.9  Platelets 150 -  400 K/uL 318 281 256    . CMP Latest Ref Rng & Units 11/28/2017 06/12/2017 03/12/2015  Glucose 70 - 99 mg/dL 94 94 267(H)  BUN 8 - 23 mg/dL _0 Creatinine 0.44 - 1.00 mg/dL 0.95 1.06(H) 0.84  Sodium 135 - 145 mmol/L 141 142 138  Potassium 3.5 - 5.1 mmol/L 3.8 3.7 3.8  Chloride 98 - 111 mmol/L 103 106 102  CO2 22 - 32 mmol/L _1 Calcium 8.9 - 10.3 mg/dL 10.1 9.6 9.3  Total Protein 6.5 - 8.1 g/dL 8.0 7.7 -  Total Bilirubin 0.3 - 1.2 mg/dL 0.6 0.8 -  Alkaline Phos 38 - 126 U/L 138(H) 69 -  AST 15 - 41 U/L 15 16 -  ALT 0 - 44 U/L <6 9(L) -   . Lab Results  Component Value Date   LDH 389 (H) 11/28/2017      RADIOGRAPHIC STUDIES: I have personally reviewed the radiological images as listed and agreed with the findings in the report. Mr Lumbar Spine W Wo Contrast  Result Date: 11/15/2017 CLINICAL DATA:  Left radicular back pain EXAM: MRI LUMBAR SPINE WITHOUT AND WITH CONTRAST TECHNIQUE: Multiplanar and multiecho pulse sequences of the lumbar spine were obtained without and with intravenous contrast. Creatinine was obtained on site at [place] at [address] Results: Creatinine 1 mg/dL. CONTRAST:  33m MULTIHANCE GADOBENATE DIMEGLUMINE 529 MG/ML IV SOLN COMPARISON:  03/08/2005 FINDINGS: Segmentation: 5 lumbar type vertebral bodies based on the lowest ribs Alignment:  Slight anterolisthesis at L3-4 Vertebrae: Sparing L4, from T11 to L5 there are infiltrating marrow lesions consistent with metastatic disease. The largest is in the L2 body where there is pathologic inferior and superior endplate fractures with mild height loss. The bilateral sacrum and S2 body is also involved. There is left far-lateral tumor infiltration at L2, best seen on precontrast axial T1 weighted imaging, which could affect far-lateral nerve roots/lumbar plexus. No epidural tumor extension is seen. Conus medullaris and cauda equina: Conus extends to the L1-2 level. Conus and cauda equina appear  normal. Paraspinal and other soft tissues: Negative Disc levels: Generalized disc narrowing and bulging most notable at L4-5 and L5-S1 where this causes foraminal crowding. There is also facet spurring most notable at L3-4 where there is mild anterolisthesis. These results will be called to the ordering clinician or representative by the Radiologist Assistant, and communication documented in the PACS or zVision Dashboard. IMPRESSION: 1. Widespread marrow lesions consistent with metastatic disease. 2. Pathologic endplate fractures at L2. 3. Concerning left leg radiculopathy, there is far-lateral tumor infiltration at L2 which could affect extraforaminal nerve roots. No epidural tumor extension. Electronically Signed   By: JMonte FantasiaM.D.   On: 11/15/2017 13:14    ASSESSMENT & PLAN:   72y.o. female with  1. Metastatic bone lesions of unknown primary  R/o Myeloma R/o metastatic carcinoma with  unknown primary  2. COugh with hemoptysis  3. Weight loss PLAN -Discussed patient's most recent labs from 06/12/17, mild anemia with HGB at 11.6, no other cytopenias. -Discussed the 11/15/17 MRI Lumbar which revealed Widespread marrow lesions consistent with metastatic disease. 2. Pathologic endplate fractures at L2. 3. Concerning left leg radiculopathy, there is far-lateral tumor infiltration at L2 which could affect extraforaminal nerve roots. No epidural tumor extension. -Will order additional blood tests as noted below -Will refer pt to IR for BM Bx -Will order PET/CT to evaluate for additional bone lesions and try to find a primary site for metastases and to help direct further diagnostic w/u -Will order Tessalon pearls prn for cough -Vicodin for lower back pain as needed  . Orders Placed This Encounter  Procedures  . NM PET Image Initial (PI) Whole Body    Standing Status:   Future    Standing Expiration Date:   11/28/2018    Order Specific Question:   ** REASON FOR EXAM (FREE TEXT)     Answer:   Patient with extensive bone metastases with weight loss and unknown primary ? metastatic carcinoma vs myeloma. to direct further evaluation and treatment    Order Specific Question:   If indicated for the ordered procedure, I authorize the administration of a radiopharmaceutical per Radiology protocol    Answer:   Yes    Order Specific Question:   Preferred imaging location?    Answer:   [facility]    Order Specific Question:   Radiology Contrast Protocol - do NOT remove file path    Answer:   \\[email or web address]  . CT Biopsy    Standing Status:   Future    Number of Occurrences:   1    Standing Expiration Date:   11/28/2018    Order Specific Question:   Lab orders requested (DO NOT place separate lab orders, these will be automatically ordered during procedure specimen collection):    Answer:   Surgical Pathology    Comments:   flow cytometry, FISH panel as needed, molecular studies as needed    Order Specific Question:   Reason for Exam (SYMPTOM  OR DIAGNOSIS REQUIRED)    Answer:   unilateral bone marrow aspiration and biopsy --patient with extensive bone marrow infiltration    Order Specific Question:   Preferred imaging location?    Answer:   [facility]    Order Specific Question:   Radiology Contrast Protocol - do NOT remove file path    Answer:   \\[email or web address]\CTProtocols.pdf  . CT BONE MARROW BIOPSY & ASPIRATION    Standing Status:   Future    Number of Occurrences:   1    Standing Expiration Date:   03/01/2019    Order Specific Question:   Reason for Exam (SYMPTOM  OR DIAGNOSIS REQUIRED)    Answer:   extensive bone marrow infiltrative process on MRI L spine with back pain ? myeloma vs metastatic malignancy (Unilateral Bone marrow aspiration and biopsy)    Order Specific Question:   Preferred imaging location?    Answer:   [facility]    Order Specific Question:   Radiology Contrast Protocol - do NOT  remove file path    Answer:   \\[email or web address]\Radiant\CTProtocols.pdf  . CBC with Differential/Platelet    Standing Status:   Future    Number of Occurrences:   1    Standing Expiration Date:   01/02/2019  . CMP ([place] only)    Standing  Status:   Future    Number of Occurrences:   1    Standing Expiration Date:   11/29/2018  . Lactate dehydrogenase    Standing Status:   Future    Number of Occurrences:   1    Standing Expiration Date:   11/29/2018  . Multiple Myeloma Panel (SPEP&IFE w/QIG)    Standing Status:   Future    Number of Occurrences:   1    Standing Expiration Date:   11/29/2018  . Kappa/lambda light chains    Standing Status:   Future    Number of Occurrences:   1    Standing Expiration Date:   01/02/2019  . Beta 2 microglobulin, serum    Standing Status:   Future    Number of Occurrences:   1    Standing Expiration Date:   11/28/2018    Labs today PET/CT in 5 days CT bone marrow aspiration and biopsy in 3-4 days RTC with Dr [name] in 2 weeks    All of the patients questions were answered with apparent satisfaction. The patient knows to call the clinic with any problems, questions or concerns.  The total time spent in the appt was 45 minutes and more than 50% was on counseling and direct patient cares.    Sullivan Lone MD MS AAHIVMS [facility] Hematology/Oncology Physician [facility]  (Office):       [phone] (Work cell):  [phone] (Fax):           [phone]  11/28/2017 2:55 PM  I, Baldwin Jamaica, am acting as a scribe for Dr. Sullivan Lone.   .I have reviewed the above documentation for accuracy and completeness, and I agree with the above. Brunetta Genera MD

## 2017-11-27 NOTE — Telephone Encounter (Signed)
New referral received from Dr. Marlou Sa for metastatic cancer lesions. Pt has been scheduled to see Dr. [name] on 11/21 at 1pm. Pt aware to arrive 30 minutes early.

## 2017-11-28 ENCOUNTER — Telehealth: Payer: Self-pay | Admitting: Hematology

## 2017-11-28 ENCOUNTER — Encounter: Payer: Self-pay | Admitting: Hematology

## 2017-11-28 ENCOUNTER — Inpatient Hospital Stay: Payer: Medicare Other

## 2017-11-28 ENCOUNTER — Inpatient Hospital Stay: Payer: Medicare Other | Attending: Hematology | Admitting: Hematology

## 2017-11-28 VITALS — BP 164/93 | HR 84 | Temp 98.4°F | Resp 18 | Ht 60.0 in | Wt 152.1 lb

## 2017-11-28 DIAGNOSIS — Z8249 Family history of ischemic heart disease and other diseases of the circulatory system: Secondary | ICD-10-CM | POA: Insufficient documentation

## 2017-11-28 DIAGNOSIS — Z800 Family history of malignant neoplasm of digestive organs: Secondary | ICD-10-CM | POA: Insufficient documentation

## 2017-11-28 DIAGNOSIS — D649 Anemia, unspecified: Secondary | ICD-10-CM | POA: Diagnosis not present

## 2017-11-28 DIAGNOSIS — E119 Type 2 diabetes mellitus without complications: Secondary | ICD-10-CM | POA: Diagnosis not present

## 2017-11-28 DIAGNOSIS — Z791 Long term (current) use of non-steroidal anti-inflammatories (NSAID): Secondary | ICD-10-CM | POA: Insufficient documentation

## 2017-11-28 DIAGNOSIS — C7951 Secondary malignant neoplasm of bone: Secondary | ICD-10-CM

## 2017-11-28 DIAGNOSIS — R059 Cough, unspecified: Secondary | ICD-10-CM

## 2017-11-28 DIAGNOSIS — Z79899 Other long term (current) drug therapy: Secondary | ICD-10-CM

## 2017-11-28 DIAGNOSIS — Z794 Long term (current) use of insulin: Secondary | ICD-10-CM | POA: Diagnosis not present

## 2017-11-28 DIAGNOSIS — R634 Abnormal weight loss: Secondary | ICD-10-CM | POA: Insufficient documentation

## 2017-11-28 DIAGNOSIS — R05 Cough: Secondary | ICD-10-CM | POA: Insufficient documentation

## 2017-11-28 DIAGNOSIS — C801 Malignant (primary) neoplasm, unspecified: Secondary | ICD-10-CM | POA: Diagnosis not present

## 2017-11-28 DIAGNOSIS — Z7901 Long term (current) use of anticoagulants: Secondary | ICD-10-CM

## 2017-11-28 DIAGNOSIS — M545 Low back pain: Secondary | ICD-10-CM | POA: Diagnosis not present

## 2017-11-28 DIAGNOSIS — I10 Essential (primary) hypertension: Secondary | ICD-10-CM | POA: Diagnosis not present

## 2017-11-28 DIAGNOSIS — Z7984 Long term (current) use of oral hypoglycemic drugs: Secondary | ICD-10-CM | POA: Diagnosis not present

## 2017-11-28 DIAGNOSIS — Z8673 Personal history of transient ischemic attack (TIA), and cerebral infarction without residual deficits: Secondary | ICD-10-CM | POA: Diagnosis not present

## 2017-11-28 DIAGNOSIS — M549 Dorsalgia, unspecified: Secondary | ICD-10-CM

## 2017-11-28 LAB — CMP (CANCER CENTER ONLY)
ALBUMIN: 3.8 g/dL (ref 3.5–5.0)
AST: 15 U/L (ref 15–41)
Alkaline Phosphatase: 138 U/L — ABNORMAL HIGH (ref 38–126)
Anion gap: 12 (ref 5–15)
BUN: 11 mg/dL (ref 8–23)
CO2: 26 mmol/L (ref 22–32)
Calcium: 10.1 mg/dL (ref 8.9–10.3)
Chloride: 103 mmol/L (ref 98–111)
Creatinine: 0.95 mg/dL (ref 0.44–1.00)
GFR, EST NON AFRICAN AMERICAN: 58 mL/min — AB (ref >60)
GFR, Est AFR Am: >60 mL/min (ref >60)
Glucose, Bld: 94 mg/dL (ref 70–99)
POTASSIUM: 3.8 mmol/L (ref 3.5–5.1)
SODIUM: 141 mmol/L (ref 135–145)
Total Bilirubin: 0.6 mg/dL (ref 0.3–1.2)
Total Protein: 8 g/dL (ref 6.5–8.1)

## 2017-11-28 LAB — CBC WITH DIFFERENTIAL/PLATELET
Abs Immature Granulocytes: 0.04 10*3/uL (ref 0.00–0.07)
BASOS ABS: 0 10*3/uL (ref 0.0–0.1)
Basophils Relative: 0 %
Eosinophils Absolute: 0.3 10*3/uL (ref 0.0–0.5)
Eosinophils Relative: 3 %
HEMATOCRIT: 36 % (ref 36.0–46.0)
HEMOGLOBIN: 11.3 g/dL — AB (ref 12.0–15.0)
Immature Granulocytes: 0 %
LYMPHS ABS: 2 10*3/uL (ref 0.7–4.0)
Lymphocytes Relative: 19 %
MCH: 29.6 pg (ref 26.0–34.0)
MCHC: 31.4 g/dL (ref 30.0–36.0)
MCV: 94.2 fL (ref 80.0–100.0)
MONO ABS: 0.5 10*3/uL (ref 0.1–1.0)
Monocytes Relative: 5 %
NEUTROS ABS: 7.5 10*3/uL (ref 1.7–7.7)
NRBC: 0 % (ref 0.0–0.2)
Neutrophils Relative %: 73 %
Platelets: 281 10*3/uL (ref 150–400)
RBC: 3.82 MIL/uL — AB (ref 3.87–5.11)
RDW: 13.5 % (ref 11.5–15.5)
WBC: 10.4 10*3/uL (ref 4.0–10.5)

## 2017-11-28 LAB — LACTATE DEHYDROGENASE: LDH: 389 U/L — AB (ref 98–192)

## 2017-11-28 MED ORDER — BENZONATATE 100 MG PO CAPS: 100.0000 mg | ORAL_CAPSULE | Freq: Three times a day (TID) | ORAL | 1 refills | Status: DC | PRN

## 2017-11-28 MED ORDER — HYDROCODONE-ACETAMINOPHEN 5-325 MG PO TABS: 1.0000 | ORAL_TABLET | Freq: Four times a day (QID) | ORAL | 0 refills | Status: DC | PRN

## 2017-11-28 NOTE — Patient Instructions (Signed)
Thank you for choosing [facility] to provide your oncology and hematology care.  To afford each patient quality time with our providers, please arrive 30 minutes before your scheduled appointment time.  If you arrive late for your appointment, you may be asked to reschedule.  We strive to give you quality time with our providers, and arriving late affects you and other patients whose appointments are after yours.    If you are a no show for multiple scheduled visits, you may be dismissed from the clinic at the providers discretion.     Again, thank you for choosing [facility], our hope is that these requests will decrease the amount of time that you wait before being seen by our physicians.  ______________________________________________________________________   Should you have questions after your visit to the [facility], please contact our office at [phone] between the hours of 8:30 and 4:30 p.m.    Voicemails left after 4:30p.m will not be returned until the following business day.     For prescription refill requests, please have your pharmacy contact us directly.  Please also try to allow 48 hours for prescription requests.     Please contact the scheduling department for questions regarding scheduling.  For scheduling of procedures such as PET scans, CT scans, MRI, Ultrasound, etc please contact central scheduling at [phone].     Resources For Cancer Patients and Caregivers:    Oncolink.org:  A wonderful resource for patients and healthcare providers for information regarding your disease, ways to tract your treatment, what to expect, etc.      American Cancer Society:  800-227-2345  Can help patients locate various types of support and financial assistance   Cancer Care: 1-800-813-HOPE (4673) Provides financial assistance, online support groups, medication/co-pay assistance.     [place] DSS:  336-641-3447 Where to apply  for food stamps, Medicaid, and utility assistance   Medicare Rights Center: 800-333-4114 Helps people with Medicare understand their rights and benefits, navigate the Medicare system, and secure the quality healthcare they deserve   SCAT: 336-333-6589 [place] [employer]'s shared-ride transportation service for eligible riders who have a disability that prevents them from riding the fixed route bus.     For additional information on assistance programs please contact our social worker:   Elizabeth Calhoun:  [phone]  

## 2017-11-28 NOTE — Telephone Encounter (Signed)
Printed calendar and avs. °

## 2017-11-29 LAB — BETA 2 MICROGLOBULIN, SERUM: Beta-2 Microglobulin: 1.8 mg/L (ref 0.6–2.4)

## 2017-11-29 LAB — KAPPA/LAMBDA LIGHT CHAINS
KAPPA FREE LGHT CHN: 29.3 mg/L — AB (ref 3.3–19.4)
Kappa, lambda light chain ratio: 1.11 (ref 0.26–1.65)
Lambda free light chains: 26.3 mg/L (ref 5.7–26.3)

## 2017-12-02 ENCOUNTER — Telehealth: Payer: Self-pay | Admitting: *Deleted

## 2017-12-02 LAB — MULTIPLE MYELOMA PANEL, SERUM
ALPHA 1: 0.3 g/dL (ref 0.0–0.4)
ALPHA2 GLOB SERPL ELPH-MCNC: 1.1 g/dL — AB (ref 0.4–1.0)
Albumin SerPl Elph-Mcnc: 3.4 g/dL (ref 2.9–4.4)
Albumin/Glob SerPl: 0.9 (ref 0.7–1.7)
B-Globulin SerPl Elph-Mcnc: 1.3 g/dL (ref 0.7–1.3)
Gamma Glob SerPl Elph-Mcnc: 1 g/dL (ref 0.4–1.8)
Globulin, Total: 3.8 g/dL (ref 2.2–3.9)
IGA: 420 mg/dL (ref 64–422)
IGG (IMMUNOGLOBIN G), SERUM: 1123 mg/dL (ref 700–1600)
IGM (IMMUNOGLOBULIN M), SRM: 47 mg/dL (ref 26–217)
Total Protein ELP: 7.2 g/dL (ref 6.0–8.5)

## 2017-12-02 NOTE — Telephone Encounter (Signed)
Received call from daughter. Patient has dry cough, coughing up blood in sm amounts. No fever. Has bone marrow biopsy scheduled for Wednesday. Should they go to ED?  Advised daughter per Dr. [name]: make sure patient is taking Tessalon for cough as prescribed. If amount of blood increases, or patient experiences shortness of breath or pain, then go to ED. Daughter [name] verbalized understanding.

## 2017-12-03 ENCOUNTER — Other Ambulatory Visit: Payer: Self-pay | Admitting: Radiology

## 2017-12-04 ENCOUNTER — Ambulatory Visit (HOSPITAL_COMMUNITY)
Admission: RE | Admit: 2017-12-04 | Discharge: 2017-12-04 | Disposition: A | Discharge status: Home / Self Care | Payer: Medicare Other | Source: Ambulatory Visit | Attending: Hematology | Admitting: Hematology

## 2017-12-04 ENCOUNTER — Encounter (HOSPITAL_COMMUNITY): Payer: Self-pay

## 2017-12-04 ENCOUNTER — Other Ambulatory Visit: Payer: Self-pay

## 2017-12-04 DIAGNOSIS — Z8249 Family history of ischemic heart disease and other diseases of the circulatory system: Secondary | ICD-10-CM | POA: Diagnosis not present

## 2017-12-04 DIAGNOSIS — M899 Disorder of bone, unspecified: Secondary | ICD-10-CM | POA: Diagnosis not present

## 2017-12-04 DIAGNOSIS — Z820 Family history of epilepsy and other diseases of the nervous system: Secondary | ICD-10-CM | POA: Insufficient documentation

## 2017-12-04 DIAGNOSIS — D649 Anemia, unspecified: Secondary | ICD-10-CM | POA: Insufficient documentation

## 2017-12-04 DIAGNOSIS — Z833 Family history of diabetes mellitus: Secondary | ICD-10-CM | POA: Insufficient documentation

## 2017-12-04 DIAGNOSIS — Z86010 Personal history of colonic polyps: Secondary | ICD-10-CM | POA: Insufficient documentation

## 2017-12-04 DIAGNOSIS — Z7902 Long term (current) use of antithrombotics/antiplatelets: Secondary | ICD-10-CM | POA: Insufficient documentation

## 2017-12-04 DIAGNOSIS — E119 Type 2 diabetes mellitus without complications: Secondary | ICD-10-CM | POA: Insufficient documentation

## 2017-12-04 DIAGNOSIS — Z823 Family history of stroke: Secondary | ICD-10-CM | POA: Insufficient documentation

## 2017-12-04 DIAGNOSIS — C801 Malignant (primary) neoplasm, unspecified: Secondary | ICD-10-CM

## 2017-12-04 DIAGNOSIS — Z794 Long term (current) use of insulin: Secondary | ICD-10-CM | POA: Insufficient documentation

## 2017-12-04 DIAGNOSIS — C7951 Secondary malignant neoplasm of bone: Secondary | ICD-10-CM

## 2017-12-04 DIAGNOSIS — Z79899 Other long term (current) drug therapy: Secondary | ICD-10-CM | POA: Diagnosis not present

## 2017-12-04 DIAGNOSIS — E785 Hyperlipidemia, unspecified: Secondary | ICD-10-CM | POA: Diagnosis not present

## 2017-12-04 DIAGNOSIS — Z8673 Personal history of transient ischemic attack (TIA), and cerebral infarction without residual deficits: Secondary | ICD-10-CM | POA: Insufficient documentation

## 2017-12-04 DIAGNOSIS — D7589 Other specified diseases of blood and blood-forming organs: Secondary | ICD-10-CM | POA: Diagnosis not present

## 2017-12-04 DIAGNOSIS — Z886 Allergy status to analgesic agent status: Secondary | ICD-10-CM | POA: Diagnosis not present

## 2017-12-04 DIAGNOSIS — M1990 Unspecified osteoarthritis, unspecified site: Secondary | ICD-10-CM | POA: Insufficient documentation

## 2017-12-04 LAB — CBC WITH DIFFERENTIAL/PLATELET
Abs Immature Granulocytes: 0.04 10*3/uL (ref 0.00–0.07)
BASOS PCT: 1 %
Basophils Absolute: 0 10*3/uL (ref 0.0–0.1)
EOS PCT: 5 %
Eosinophils Absolute: 0.4 10*3/uL (ref 0.0–0.5)
HCT: 36.2 % (ref 36.0–46.0)
Hemoglobin: 11.1 g/dL — ABNORMAL LOW (ref 12.0–15.0)
Immature Granulocytes: 1 %
Lymphocytes Relative: 17 %
Lymphs Abs: 1.4 10*3/uL (ref 0.7–4.0)
MCH: 29.7 pg (ref 26.0–34.0)
MCHC: 30.7 g/dL (ref 30.0–36.0)
MCV: 96.8 fL (ref 80.0–100.0)
MONO ABS: 0.6 10*3/uL (ref 0.1–1.0)
Monocytes Relative: 7 %
Neutro Abs: 6.2 10*3/uL (ref 1.7–7.7)
Neutrophils Relative %: 69 %
PLATELETS: 318 10*3/uL (ref 150–400)
RBC: 3.74 MIL/uL — AB (ref 3.87–5.11)
RDW: 13.2 % (ref 11.5–15.5)
WBC: 8.8 10*3/uL (ref 4.0–10.5)
nRBC: 0 % (ref 0.0–0.2)

## 2017-12-04 LAB — PROTIME-INR
INR: 1.02
PROTHROMBIN TIME: 13.3 s (ref 11.4–15.2)

## 2017-12-04 MED ORDER — LIDOCAINE HCL (PF) 1 % IJ SOLN
INTRAMUSCULAR | Status: AC | PRN
  Administered 2017-12-04: 10 mL

## 2017-12-04 MED ORDER — FENTANYL CITRATE (PF) 100 MCG/2ML IJ SOLN
INTRAMUSCULAR | Status: AC
  Filled 2017-12-04: qty 2

## 2017-12-04 MED ORDER — FENTANYL CITRATE (PF) 100 MCG/2ML IJ SOLN
INTRAMUSCULAR | Status: AC | PRN
  Administered 2017-12-04 (×2): 50 ug via INTRAVENOUS

## 2017-12-04 MED ORDER — SODIUM CHLORIDE 0.9 % IV SOLN
INTRAVENOUS | Status: DC
  Administered 2017-12-04: 07:00:00 via INTRAVENOUS

## 2017-12-04 MED ORDER — MIDAZOLAM HCL 2 MG/2ML IJ SOLN
INTRAMUSCULAR | Status: AC
  Filled 2017-12-04: qty 4

## 2017-12-04 MED ORDER — MIDAZOLAM HCL 2 MG/2ML IJ SOLN
INTRAMUSCULAR | Status: AC | PRN
  Administered 2017-12-04: 0.5 mg via INTRAVENOUS
  Administered 2017-12-04: 1 mg via INTRAVENOUS
  Administered 2017-12-04: 0.5 mg via INTRAVENOUS

## 2017-12-04 NOTE — Discharge Instructions (Signed)
Bone Marrow Aspiration and Bone Marrow Biopsy, Adult, Care After This sheet gives you information about how to care for yourself after your procedure. Your health care provider may also give you more specific instructions. If you have problems or questions, contact your health care provider. What can I expect after the procedure? After the procedure, it is common to have:  Mild pain and tenderness.  Swelling.  Bruising.  Follow these instructions at home:  Take over-the-counter or prescription medicines only as told by your health care provider.  Do not take baths, swim, or use a hot tub until your health care provider approves. Ask if you can take a shower or have a sponge bath.  Follow instructions from your health care provider about how to take care of the puncture site. Make sure you: ? Wash your hands with soap and water before you change your bandage (dressing). If soap and water are not available, use hand sanitizer. ? Change your dressing as told by your health care provider.  Check your puncture siteevery day for signs of infection. Check for: ? More redness, swelling, or pain. ? More fluid or blood. ? Warmth. ? Pus or a bad smell.  Return to your normal activities as told by your health care provider. Ask your health care provider what activities are safe for you.  Do not drive for 24 hours if you were given a medicine to help you relax (sedative).  Keep all follow-up visits as told by your health care provider. This is important. Contact a health care provider if:  You have more redness, swelling, or pain around the puncture site.  You have more fluid or blood coming from the puncture site.  Your puncture site feels warm to the touch.  You have pus or a bad smell coming from the puncture site.  You have a fever.  Your pain is not controlled with medicine. This information is not intended to replace advice given to you by your health care provider. Make sure  you discuss any questions you have with your health care provider. Document Released: 07/14/2004 Document Revised: 07/15/2015 Document Reviewed: 06/08/2015 Elsevier Interactive Patient Education  2018 [employer].   Moderate Conscious Sedation, Adult, Care After These instructions provide you with information about caring for yourself after your procedure. Your health care provider may also give you more specific instructions. Your treatment has been planned according to current medical practices, but problems sometimes occur. Call your health care provider if you have any problems or questions after your procedure. What can I expect after the procedure? After your procedure, it is common:  To feel sleepy for several hours.  To feel clumsy and have poor balance for several hours.  To have poor judgment for several hours.  To vomit if you eat too soon.  Follow these instructions at home: For at least 24 hours after the procedure:   Do not: ? Participate in activities where you could fall or become injured. ? Drive. ? Use heavy machinery. ? Drink alcohol. ? Take sleeping pills or medicines that cause drowsiness. ? Make important decisions or sign legal documents. ? Take care of children on your own.  Rest. Eating and drinking  Follow the diet recommended by your health care provider.  If you vomit: ? Drink water, juice, or soup when you can drink without vomiting. ? Make sure you have little or no nausea before eating solid foods. General instructions  Have a responsible adult stay with you until  you are awake and alert.  Take over-the-counter and prescription medicines only as told by your health care provider.  If you smoke, do not smoke without supervision.  Keep all follow-up visits as told by your health care provider. This is important. Contact a health care provider if:  You keep feeling nauseous or you keep vomiting.  You feel light-headed.  You develop a  rash.  You have a fever. Get help right away if:  You have trouble breathing. This information is not intended to replace advice given to you by your health care provider. Make sure you discuss any questions you have with your health care provider. Document Released: 10/15/2012 Document Revised: 05/30/2015 Document Reviewed: 04/16/2015 Elsevier Interactive Patient Education  [employer].

## 2017-12-04 NOTE — H&P (Signed)
Chief Complaint: Patient was seen in consultation today for bone marrow biopsy at the request of Brunetta Genera  Referring Physician(s): Brunetta Genera  Supervising Physician: Aletta Edouard  Patient Status: [place]  History of Present Illness: Elizabeth Calhoun is a 72 y.o. female being worked up for multiple bone lesions. She is referred for bone marrow biopsy. PMHx, meds, labs, imaging, allergies reviewed. Feels well, no recent fevers, chills, illness. Has been NPO today as directed. Family at bedside.   Past Medical History:  Diagnosis Date  . Adenomatous colon polyp   . Diabetes mellitus without complication ([place])   . Hemorrhoids   . Menopause   . OA (osteoarthritis)   . Obesity   . Other and unspecified hyperlipidemia   . Stroke [facility])     Past Surgical History:  Procedure Laterality Date  . SHOULDER SURGERY  2005    Allergies: Aspirin  Medications: Prior to Admission medications   Medication Sig Start Date End Date Taking? Authorizing Provider  benzonatate (TESSALON) 100 MG capsule Take 1 capsule (100 mg total) by mouth 3 (three) times daily as needed for cough. 11/28/17  Yes Brunetta Genera, MD  BIOTIN PO Take 1 tablet by mouth daily.   Yes [provider]  clopidogrel (PLAVIX) 75 MG tablet Take 75 mg by mouth daily. 11/23/16  Yes [provider]  diphenhydrAMINE (BENADRYL) 25 mg capsule Take 25 mg by mouth daily.   Yes [provider]  diphenhydramine-acetaminophen (TYLENOL PM) 25-500 MG TABS tablet Take 1 tablet by mouth at bedtime as needed (sleep).   Yes [provider]  hydrochlorothiazide (HYDRODIURIL) 25 MG tablet Take 25 mg by mouth daily. 10/09/16  Yes [provider]  insulin lispro (HUMALOG) 100 UNIT/ML injection Inject 10 Units into the skin at bedtime.   Yes [provider]  JARDIANCE 25 MG TABS tablet Take 25 mg by mouth daily. 10/01/16  Yes [provider]    lisinopril (PRINIVIL,ZESTRIL) 10 MG tablet Take 10 mg by mouth daily.   Yes [provider]  loratadine (CLARITIN) 10 MG tablet Take 1 tablet (10 mg total) by mouth daily. 05/07/13  Yes Presson, Annett Gula H, PA  losartan (COZAAR) 25 MG tablet Take 25 mg by mouth daily.   Yes [provider]  meclizine (ANTIVERT) 25 MG tablet Take 1 tablet (25 mg total) by mouth 3 (three) times daily as needed for dizziness. 03/12/15  Yes Fredia Sorrow, MD  metFORMIN (GLUCOPHAGE) 1000 MG tablet Take 1,000 mg by mouth 2 (two) times daily with a meal.   Yes [provider]  pioglitazone (ACTOS) 45 MG tablet Take 45 mg by mouth at bedtime. 10/01/16  Yes [provider]  rosuvastatin (CRESTOR) 40 MG tablet Take 40 mg by mouth daily. 10/01/16  Yes [provider]  zolpidem (AMBIEN) 10 MG tablet Take 10 mg by mouth at bedtime.    Yes [provider]  azithromycin (ZITHROMAX) 250 MG tablet Take 1 tablet daily Patient not taking: Reported on 11/28/2017 06/13/17   Isla Pence, MD  HYDROcodone-acetaminophen (NORCO/VICODIN) 5-325 MG tablet Take 1 tablet by mouth every 6 (six) hours as needed for moderate pain or severe pain. 11/28/17   Brunetta Genera, MD  meloxicam (MOBIC) 7.5 MG tablet Take 1 tablet (7.5 mg total) by mouth 2 (two) times daily as needed for pain. Patient not taking: Reported on 06/12/2017 12/20/16   Virgel Manifold, MD  meloxicam (MOBIC) 7.5 MG tablet Take 1 tablet (7.5 mg  total) by mouth 2 (two) times daily as needed for pain. Patient not taking: Reported on 11/28/2017 10/01/17   Hilts, Legrand Como, MD  methocarbamol (ROBAXIN) 500 MG tablet Take 1 tablet (500 mg total) by mouth every 6 (six) hours as needed for muscle spasms. Patient not taking: Reported on 11/28/2017 10/01/17   Hilts, Legrand Como, MD  predniSONE (STERAPRED UNI-PAK 21 TAB) 10 MG (21) TBPK tablet Take as directed Patient not taking: Reported on 12/20/2016 02/29/16   Leandrew Koyanagi, MD   traMADol (ULTRAM) 50 MG tablet Take 1 tablet (50 mg total) by mouth every 6 (six) hours as needed. Patient not taking: Reported on 06/12/2017 12/20/16   Virgel Manifold, MD     Family History  Problem Relation Age of Onset  . Heart attack Father   . Heart disease Brother        1/8  . Heart disease Brother        2/8  . Heart disease Brother        3/8  . Heart disease Brother        4/8  . Heart disease Brother        5/8  . Heart disease Brother        6/8  . Cancer Brother        7/8  . Seizures Brother        8/8  . Stroke Brother        8/8  . Heart disease Sister        1/6  . Alzheimer's disease Sister        2/6  . Diabetes Sister        3/6  . Hypertension Sister        3/6  . Hypertension Sister        4/6  . Diabetes Sister        4/6  . Diabetes Daughter     Social History   Socioeconomic History  . Marital status: Married    Spouse name: Not on file  . Number of children: 3  . Years of education: Not on file  . Highest education level: Not on file  Occupational History  . Occupation: works at [employer]  . Financial resource strain: Not on file  . Food insecurity:    Worry: Not on file    Inability: Not on file  . Transportation needs:    Medical: Not on file    Non-medical: Not on file  Tobacco Use  . Smoking status: Never Smoker  . Smokeless tobacco: Never Used  Substance and Sexual Activity  . Alcohol use: Yes    Comment: occasionally  . Drug use: No  . Sexual activity: Not on file  Lifestyle  . Physical activity:    Days per week: Not on file    Minutes per session: Not on file  . Stress: Not on file  Relationships  . Social connections:    Talks on phone: Not on file    Gets together: Not on file    Attends religious service: Not on file    Active member of club or organization: Not on file    Attends meetings of clubs or organizations: Not on file    Relationship status: Not on file  Other Topics Concern  . Not  on file  Social History Narrative  . Not on file     Review of Systems: A 12 point ROS discussed and pertinent positives are  indicated in the HPI above.  All other systems are negative.  Review of Systems  Vital Signs: BP 127/77   Pulse 84   Temp 99.6 F (37.6 C) (Oral)   Resp 16   SpO2 100%   Physical Exam  Constitutional: She is oriented to person, place, and time. She appears well-developed. No distress.  HENT:  Head: Normocephalic.  Mouth/Throat: Oropharynx is clear and moist.  Cardiovascular: Normal rate, regular rhythm and normal heart sounds.  Pulmonary/Chest: Effort normal and breath sounds normal. No respiratory distress.  Neurological: She is alert and oriented to person, place, and time.  Skin: Skin is warm and dry.  Psychiatric: She has a normal mood and affect. Judgment normal.    Imaging: Mr Lumbar Spine W Wo Contrast  Result Date: 11/15/2017 CLINICAL DATA:  Left radicular back pain EXAM: MRI LUMBAR SPINE WITHOUT AND WITH CONTRAST TECHNIQUE: Multiplanar and multiecho pulse sequences of the lumbar spine were obtained without and with intravenous contrast. Creatinine was obtained on site at [place] at [address] Results: Creatinine 1 mg/dL. CONTRAST:  81m MULTIHANCE GADOBENATE DIMEGLUMINE 529 MG/ML IV SOLN COMPARISON:  03/08/2005 FINDINGS: Segmentation: 5 lumbar type vertebral bodies based on the lowest ribs Alignment:  Slight anterolisthesis at L3-4 Vertebrae: Sparing L4, from T11 to L5 there are infiltrating marrow lesions consistent with metastatic disease. The largest is in the L2 body where there is pathologic inferior and superior endplate fractures with mild height loss. The bilateral sacrum and S2 body is also involved. There is left far-lateral tumor infiltration at L2, best seen on precontrast axial T1 weighted imaging, which could affect far-lateral nerve roots/lumbar plexus. No epidural tumor extension is seen. Conus medullaris and cauda  equina: Conus extends to the L1-2 level. Conus and cauda equina appear normal. Paraspinal and other soft tissues: Negative Disc levels: Generalized disc narrowing and bulging most notable at L4-5 and L5-S1 where this causes foraminal crowding. There is also facet spurring most notable at L3-4 where there is mild anterolisthesis. These results will be called to the ordering clinician or representative by the Radiologist Assistant, and communication documented in the PACS or zVision Dashboard. IMPRESSION: 1. Widespread marrow lesions consistent with metastatic disease. 2. Pathologic endplate fractures at L2. 3. Concerning left leg radiculopathy, there is far-lateral tumor infiltration at L2 which could affect extraforaminal nerve roots. No epidural tumor extension. Electronically Signed   By: JMonte FantasiaM.D.   On: 11/15/2017 13:14    Labs:  CBC: Recent Labs    06/12/17 2109 11/28/17 1506 12/04/17 0724  WBC 7.7 10.4 8.8  HGB 11.6* 11.3* 11.1*  HCT 36.9 36.0 36.2  PLT 256 281 318    COAGS: Recent Labs    12/04/17 0724  INR 1.02    BMP: Recent Labs    06/12/17 2109 11/28/17 1506  NA 142 141  K 3.7 3.8  CL 106 103  CO2 26 26  GLUCOSE 94 94  BUN 18 11  CALCIUM 9.6 10.1  CREATININE 1.06* 0.95  GFRNONAA 51* 58*  GFRAA 59* >60    LIVER FUNCTION TESTS: Recent Labs    06/12/17 2109 11/28/17 1506  BILITOT 0.8 0.6  AST 16 15  ALT 9* <6  ALKPHOS 69 138*  PROT 7.7 8.0  ALBUMIN 4.3 3.8    TUMOR MARKERS: No results for input(s): AFPTM, CEA, CA199, CHROMGRNA in the last 8760 hours.  Assessment and Plan: Multiple bone lesions. For CT guided bone marrow biopsy Labs ok Risks and benefits  discussed with the patient including, but not limited to bleeding, infection, damage to adjacent structures or low yield requiring additional tests.  All of the patient's questions were answered, patient is agreeable to proceed. Consent signed and in chart.    Thank you for this  interesting consult.  I greatly enjoyed meeting Oaklee Esther and look forward to participating in their care.  A copy of this report was sent to the requesting provider on this date.  Electronically Signed: Ascencion Dike, PA-C 12/04/2017, 8:50 AM   I spent a total of 20 minutes in face to face in clinical consultation, greater than 50% of which was counseling/coordinating care for bone marrow biopsy

## 2017-12-04 NOTE — Procedures (Signed)
Interventional Radiology Procedure Note  Procedure: CT guided bone marrow aspiration and biopsy  Complications: None  EBL: < 10 mL  Findings: Aspirate and core biopsy performed of bone marrow in right iliac bone.  Plan: Bedrest supine x 1 hrs  Averie Hornbaker T. Dalene Robards, M.D Pager:  [phone]   

## 2017-12-07 ENCOUNTER — Emergency Department (HOSPITAL_COMMUNITY)
Admission: EM | Admit: 2017-12-07 | Discharge: 2017-12-07 | Disposition: A | Discharge status: Home / Self Care | Payer: Medicare Other | Attending: Emergency Medicine | Admitting: Emergency Medicine

## 2017-12-07 ENCOUNTER — Emergency Department (HOSPITAL_COMMUNITY): Payer: Medicare Other

## 2017-12-07 ENCOUNTER — Encounter (HOSPITAL_COMMUNITY): Payer: Self-pay | Admitting: Emergency Medicine

## 2017-12-07 DIAGNOSIS — M25511 Pain in right shoulder: Secondary | ICD-10-CM | POA: Diagnosis not present

## 2017-12-07 DIAGNOSIS — R918 Other nonspecific abnormal finding of lung field: Secondary | ICD-10-CM | POA: Insufficient documentation

## 2017-12-07 DIAGNOSIS — E119 Type 2 diabetes mellitus without complications: Secondary | ICD-10-CM | POA: Diagnosis not present

## 2017-12-07 DIAGNOSIS — M25512 Pain in left shoulder: Secondary | ICD-10-CM | POA: Insufficient documentation

## 2017-12-07 DIAGNOSIS — J9809 Other diseases of bronchus, not elsewhere classified: Secondary | ICD-10-CM | POA: Diagnosis not present

## 2017-12-07 LAB — TROPONIN I: Troponin I: <0.03 ng/mL (ref <0.03)

## 2017-12-07 LAB — CBC WITH DIFFERENTIAL/PLATELET
ABS IMMATURE GRANULOCYTES: 0.1 10*3/uL — AB (ref 0.00–0.07)
BASOS PCT: 0 %
Basophils Absolute: 0 10*3/uL (ref 0.0–0.1)
Eosinophils Absolute: 0.4 10*3/uL (ref 0.0–0.5)
Eosinophils Relative: 3 %
HCT: 38.6 % (ref 36.0–46.0)
HEMOGLOBIN: 12.1 g/dL (ref 12.0–15.0)
Immature Granulocytes: 1 %
LYMPHS ABS: 1.2 10*3/uL (ref 0.7–4.0)
LYMPHS PCT: 12 %
MCH: 29.7 pg (ref 26.0–34.0)
MCHC: 31.3 g/dL (ref 30.0–36.0)
MCV: 94.8 fL (ref 80.0–100.0)
MONO ABS: 0.5 10*3/uL (ref 0.1–1.0)
MONOS PCT: 5 %
NEUTROS ABS: 8.5 10*3/uL — AB (ref 1.7–7.7)
NEUTROS PCT: 79 %
PLATELETS: 354 10*3/uL (ref 150–400)
RBC: 4.07 MIL/uL (ref 3.87–5.11)
RDW: 13.1 % (ref 11.5–15.5)
WBC: 10.7 10*3/uL — ABNORMAL HIGH (ref 4.0–10.5)
nRBC: 0 % (ref 0.0–0.2)

## 2017-12-07 LAB — BASIC METABOLIC PANEL
ANION GAP: 11 (ref 5–15)
BUN: 11 mg/dL (ref 8–23)
CHLORIDE: 102 mmol/L (ref 98–111)
CO2: 27 mmol/L (ref 22–32)
Calcium: 9.6 mg/dL (ref 8.9–10.3)
Creatinine, Ser: 0.8 mg/dL (ref 0.44–1.00)
GFR calc Af Amer: >60 mL/min (ref >60)
GFR calc non Af Amer: >60 mL/min (ref >60)
GLUCOSE: 170 mg/dL — AB (ref 70–99)
POTASSIUM: 3.7 mmol/L (ref 3.5–5.1)
Sodium: 140 mmol/L (ref 135–145)

## 2017-12-07 MED ORDER — HYDROMORPHONE HCL 2 MG PO TABS
2.0000 mg | ORAL_TABLET | Freq: Once | ORAL | Status: AC
  Administered 2017-12-07: 2 mg via ORAL
  Filled 2017-12-07: qty 1

## 2017-12-07 MED ORDER — SODIUM CHLORIDE (PF) 0.9 % IJ SOLN
INTRAMUSCULAR | Status: AC
  Filled 2017-12-07: qty 50

## 2017-12-07 MED ORDER — HYDROMORPHONE HCL 1 MG/ML IJ SOLN
0.5000 mg | Freq: Once | INTRAMUSCULAR | Status: AC
  Administered 2017-12-07: 0.5 mg via INTRAVENOUS
  Filled 2017-12-07: qty 1

## 2017-12-07 MED ORDER — SODIUM CHLORIDE 0.9 % IV BOLUS
500.0000 mL | Freq: Once | INTRAVENOUS | Status: AC
  Administered 2017-12-07: 500 mL via INTRAVENOUS

## 2017-12-07 MED ORDER — HYDROMORPHONE HCL 2 MG PO TABS: 2.0000 mg | ORAL_TABLET | Freq: Four times a day (QID) | ORAL | 0 refills | Status: DC | PRN

## 2017-12-07 MED ORDER — ONDANSETRON 4 MG PO TBDP: 4.0000 mg | ORAL_TABLET | Freq: Three times a day (TID) | ORAL | 0 refills | Status: DC | PRN

## 2017-12-07 MED ORDER — IOHEXOL 300 MG/ML  SOLN
75.0000 mL | Freq: Once | INTRAMUSCULAR | Status: AC | PRN
  Administered 2017-12-07: 75 mL via INTRAVENOUS

## 2017-12-07 MED ORDER — ONDANSETRON HCL 4 MG/2ML IJ SOLN
4.0000 mg | Freq: Once | INTRAMUSCULAR | Status: AC
  Administered 2017-12-07: 4 mg via INTRAVENOUS
  Filled 2017-12-07: qty 2

## 2017-12-07 MED ORDER — HYDROMORPHONE HCL 1 MG/ML IJ SOLN
1.0000 mg | INTRAMUSCULAR | Status: DC | PRN
  Administered 2017-12-07: 1 mg via INTRAVENOUS
  Filled 2017-12-07: qty 1

## 2017-12-07 NOTE — ED Notes (Signed)
Patient transported to CT 

## 2017-12-07 NOTE — ED Notes (Signed)
ED Provider at bedside. 

## 2017-12-07 NOTE — ED Notes (Signed)
Patient transported to X-ray 

## 2017-12-07 NOTE — ED Provider Notes (Signed)
Emergency Department Provider Note   I have reviewed the triage vital signs and the nursing notes.   HISTORY  Chief Complaint Shoulder Pain   HPI Elizabeth Calhoun is a 72 y.o. female with PMH of DM, OA, prior CVA, and recent discovery of metastatic bony lesions in the lumbar spine presents to the emergency department for evaluation of severe bilateral shoulder pain.  Initially symptoms were severe on the left but has since become more diffuse and slightly worse on the right.  No injuries or fevers.  Patient has severe pain in her lower back as well as her shoulders.  States that her appetite is very poor and she frequently experiences nausea.  She has had significant unintentional weight loss and does not often take her Vicodin for pain.  Patient denies any chest pain, shortness of breath, or significant abdominal pain.  She had vomiting episodes 1 week ago which did include some blood but those have not recurred.  She is not having headaches, vision changes, or weakness/numbness.   Past Medical History:  Diagnosis Date  . Adenomatous colon polyp   . Diabetes mellitus without complication ([place])   . Hemorrhoids   . Menopause   . OA (osteoarthritis)   . Obesity   . Other and unspecified hyperlipidemia   . Stroke [facility])     There are no active problems to display for this patient.   Past Surgical History:  Procedure Laterality Date  . SHOULDER SURGERY  2005    Allergies Aspirin  Family History  Problem Relation Age of Onset  . Heart attack Father   . Heart disease Brother        1/8  . Heart disease Brother        2/8  . Heart disease Brother        3/8  . Heart disease Brother        4/8  . Heart disease Brother        5/8  . Heart disease Brother        6/8  . Cancer Brother        7/8  . Seizures Brother        8/8  . Stroke Brother        8/8  . Heart disease Sister        1/6  . Alzheimer's disease Sister        2/6  . Diabetes Sister        3/6    . Hypertension Sister        3/6  . Hypertension Sister        4/6  . Diabetes Sister        4/6  . Diabetes Daughter     Social History Social History   Tobacco Use  . Smoking status: Never Smoker  . Smokeless tobacco: Never Used  Substance Use Topics  . Alcohol use: Yes    Comment: occasionally  . Drug use: No    Review of Systems  Constitutional: No fever/chills Eyes: No visual changes. ENT: No sore throat. Cardiovascular: Denies chest pain. Respiratory: Denies shortness of breath. Gastrointestinal: No abdominal pain.  No nausea, no vomiting.  No diarrhea.  No constipation. Poor appetite.  Genitourinary: Negative for dysuria. Musculoskeletal: Positive severe back pain and bilateral shoulder pain.  Skin: Negative for rash. Neurological: Negative for headaches, focal weakness or numbness.  10-point ROS otherwise negative.  ____________________________________________   PHYSICAL EXAM:  VITAL SIGNS: ED Triage Vitals [  12/07/17 0940]  Enc Vitals Group     BP (!) 171/92     Pulse Rate (!) 116     Resp 20     Temp 98.5 F (36.9 C)     Temp Source Oral     SpO2 100 %     Pain Score 10   Constitutional: Alert and oriented. Well appearing and in no acute distress. Eyes: Conjunctivae are normal. Head: Atraumatic. Nose: No congestion/rhinnorhea. Mouth/Throat: Mucous membranes are moist. Neck: No stridor.  Cardiovascular: Tachycardia. Good peripheral circulation. Grossly normal heart sounds.   Respiratory: Normal respiratory effort.  No retractions. Lungs CTAB. Gastrointestinal: Soft and nontender. No distention.  Musculoskeletal: No lower extremity tenderness nor edema. No gross deformities of extremities. Neurologic:  Normal speech and language. No gross focal neurologic deficits are appreciated.  Skin:  Skin is warm, dry and intact. No rash noted.  ____________________________________________   LABS (all labs ordered are listed, but only abnormal results  are displayed)  Labs Reviewed  BASIC METABOLIC PANEL - Abnormal; Notable for the following components:      Result Value   Glucose, Bld 170 (*)    All other components within normal limits  CBC WITH DIFFERENTIAL/PLATELET - Abnormal; Notable for the following components:   WBC 10.7 (*)    Neutro Abs 8.5 (*)    Abs Immature Granulocytes 0.10 (*)    All other components within normal limits  TROPONIN I  URINALYSIS, ROUTINE W REFLEX MICROSCOPIC   ____________________________________________  RADIOLOGY  Dg Chest 2 View  Result Date: 12/07/2017 CLINICAL DATA:  Unbearable right shoulder pain. EXAM: CHEST - 2 VIEW COMPARISON:  06/12/2017 FINDINGS: The cardiac silhouette is normal. Calcific atherosclerotic disease and tortuosity of the aorta. Spiculated left hilar/suprahilar mass. There is no evidence of scratch pleural effusion or pneumothorax. Peribronchial thickening in the left upper lobe. Volume loss in the left hemithorax. Question lytic lesions in the lower thoracic spine. Soft tissues are grossly normal. IMPRESSION: Spiculated left hilar/suprahilar mass with peribronchial thickening in the left upper lobe. The findings are suspicious for primary lung malignancy. Further evaluation with chest CT or PET-CT is recommended. These results were called by telephone at the time of interpretation on 12/07/2017 at 11:42 am to Dr. Nanda Quinton , who verbally acknowledged these results. Electronically Signed   By: Fidela Salisbury M.D.   On: 12/07/2017 11:44   Dg Shoulder Right  Result Date: 12/07/2017 CLINICAL DATA:  Right shoulder pain. Widespread marrow lesions in the lumbar spine on recent MRI. EXAM: RIGHT SHOULDER - 2+ VIEW COMPARISON:  None. FINDINGS: No fracture. No right acromioclavicular separation. No right glenohumeral dislocation. No suspicious focal osseous lesions. Moderate acromioclavicular joint and mild glenohumeral joint osteoarthritis. No pathologic soft tissue calcifications.  IMPRESSION: 1. No right shoulder fracture or malalignment. No suspicious focal osseous lesions. 2. Moderate AC joint and mild glenohumeral joint osteoarthritis. Electronically Signed   By: Ilona Sorrel M.D.   On: 12/07/2017 11:40   Ct Chest W Contrast  Result Date: 12/07/2017 CLINICAL DATA:  Evaluate lung mass. EXAM: CT CHEST WITH CONTRAST TECHNIQUE: Multidetector CT imaging of the chest was performed during intravenous contrast administration. CONTRAST:  44mL OMNIPAQUE IOHEXOL 300 MG/ML  SOLN COMPARISON:  None. FINDINGS: Cardiovascular: Mild cardiac enlargement. Aortic atherosclerosis. Three vessel coronary artery atherosclerotic calcifications noted. Mediastinum/Nodes: Normal appearance of the thyroid gland. The trachea appears patent and is midline. Normal appearance of the esophagus. No axillary or supraclavicular adenopathy. No enlarged right paratracheal, subcarinal or right hilar adenopathy.  AP window mass measures 3.8 x 4.2 x 2.4 cm, image 46/2. This completely encases and occludes the left pulmonary artery, image 50/2. There is encasement and occlusion of the left upper lobe bronchi, image 53/2. Partial encasement of the lingular bronchi and left lower lobe bronchi noted. Enlarged left hilar lymph node measures 1.5 cm, image 60/2. Lungs/Pleura: Mild thickening of the pleura in the left posterior costophrenic sulcus noted. Mild scattered areas of pleural nodularity are noted. No pleural fluid identified. Multiple nodules are identified within the left upper lobe. Dominant nodule is in the apex measuring 3.1 cm, image 22/5. Upper Abdomen: No acute abnormality. Musculoskeletal: Multifocal lytic bone metastases are identified. -Large lytic lesion involves the T11 vertebral body measuring 2 cm, image 76/7. -Lytic lesion involving the left pedicle of T2 measures 1.5 cm, image 7/5. -Lytic lesion involving the lamina of the C7 vertebra on the right is noted, image 1/5. IMPRESSION: 1. Left apical lung mass is  identified worrisome for primary bronchogenic tumor. Associated left AP window nodal mass is identified which occludes the left pulmonary artery and left upper lobe bronchus. Enlarged left hilar lymph node also noted. Multiple pulmonary nodules are identified within the left upper lobe and there is diffuse pleural nodularity overlying the left lung worrisome for pleural spread of disease. Multifocal bone metastases are identified including a large lytic lesion involving the T11 vertebra. 2.  Aortic Atherosclerosis (ICD10-I70.0). 3. Multi vessel coronary artery atherosclerotic calcifications. Electronically Signed   By: Kerby Moors M.D.   On: 12/07/2017 13:04   Dg Shoulder Left  Result Date: 12/07/2017 CLINICAL DATA:  Left shoulder pain. Widespread marrow lesions throughout the lumbar spine on recent MRI. EXAM: LEFT SHOULDER - 2+ VIEW COMPARISON:  None. FINDINGS: No fracture. No left acromioclavicular separation. No left glenohumeral dislocation. No suspicious focal osseous lesions. Moderate left acromioclavicular and left glenohumeral osteoarthritis. No pathologic soft tissue calcifications. IMPRESSION: 1. No left shoulder fracture or malalignment. No suspicious focal osseous lesions. 2. Moderate left AC joint and left glenohumeral joint osteoarthritis. Electronically Signed   By: Ilona Sorrel M.D.   On: 12/07/2017 11:39    ____________________________________________   PROCEDURES  Procedure(s) performed:   Procedures  None ____________________________________________   INITIAL IMPRESSION / ASSESSMENT AND PLAN / ED COURSE  Pertinent labs & imaging results that were available during my care of the patient were reviewed by me and considered in my medical decision making (see chart for details).  Patient with recent diagnosis of likely metastatic bone lesions in the lumbar spine presents with pain in bilateral shoulders without injury.  No fevers or chills.  Patient is in significant pain  and feeling nauseated at home with unintentional weight loss.  She is not taking her Vicodin because of nausea.  No nausea medication at home.  No chest pain, shortness of breath, abdominal discomfort, or neuro findings.  Plan to start with plain film of the chest along with bilateral shoulders.  And follows with Dr. [name], with Oncology, and has an appointment on Thursday. PET scan ordered as outpatient but waiting on insurance for coverage so unknown primary process at this time.   Chest x-ray showing a left pulmonary mass concerning for primary lung cancer.  Followed with CT scan of the chest which confirms this.  There are portions of the mass which are compressing the left pulmonary artery and segmental bronchus.  I discussed these CT findings with pulmonary on-call, Dr. Cicero Duck, for phone consult regarding the results. With no hemodynamic instability, subjective  SOB, or chest pain there is no urgent intervention needed. Patient may require stenting at some point but primarily needs f/u with Oncology. Discussed results with patient and daughters at bedside. Offered obs admit for pain control but patient prefers to return home with PO Dilaudid and Zofran. Vitals are normal at discharge. No hypoxemia. Discussed ED return precautions.  ____________________________________________  FINAL CLINICAL IMPRESSION(S) / ED DIAGNOSES  Final diagnoses:  Mass of left lung  Acute pain of both shoulders     MEDICATIONS GIVEN DURING THIS VISIT:  Medications  HYDROmorphone (DILAUDID) injection 1 mg (1 mg Intravenous Given 12/07/17 1157)  sodium chloride (PF) 0.9 % injection (0 mLs  Hold 12/07/17 1225)  sodium chloride 0.9 % bolus 500 mL (0 mLs Intravenous Stopped 12/07/17 1156)  HYDROmorphone (DILAUDID) injection 0.5 mg (0.5 mg Intravenous Given 12/07/17 1028)  ondansetron (ZOFRAN) injection 4 mg (4 mg Intravenous Given 12/07/17 1026)  iohexol (OMNIPAQUE) 300 MG/ML solution 75 mL (75 mLs Intravenous Contrast  Given 12/07/17 1230)  HYDROmorphone (DILAUDID) tablet 2 mg (2 mg Oral Given 12/07/17 1424)     NEW OUTPATIENT MEDICATIONS STARTED DURING THIS VISIT:  New Prescriptions   HYDROMORPHONE (DILAUDID) 2 MG TABLET    Take 1 tablet (2 mg total) by mouth every 6 (six) hours as needed for up to 5 days for severe pain.   ONDANSETRON (ZOFRAN ODT) 4 MG DISINTEGRATING TABLET    Take 1 tablet (4 mg total) by mouth every 8 (eight) hours as needed for nausea or vomiting.    Note:  This document was prepared using Dragon voice recognition software and may include unintentional dictation errors.  Nanda Quinton, MD Emergency Medicine    Long, [facility], MD 12/07/17 [phone]

## 2017-12-07 NOTE — Discharge Instructions (Signed)
You were seen in the ED today with shoulder pain. We found what appears to be a lung cancer. This cancer is compression on several important structures. You are doing well at this time but will need to call your Oncologist on Monday to confirm your appointment and arrange for your PET scan. You need to return to the ED immediately if your develop chest pain, trouble breathing, fever, chills, or lightheadedness. Do NOT take the Dilaudid with the Vicodin as this can cause an unintentional overdose.

## 2017-12-07 NOTE — ED Triage Notes (Signed)
Patient brought in by family with complaints of right shoulder pain. Family reports that patient was recently diagnosed with cancer and mets. Patient reports that pain is unbearable.

## 2017-12-09 ENCOUNTER — Telehealth: Payer: Self-pay | Admitting: *Deleted

## 2017-12-09 NOTE — Telephone Encounter (Signed)
Daughter called to confirm mother's upcoming appts. Stated mother had been to ED over weekend with left shoulder pain and had CT scan that showed a tumor. Now scheduled for PET scan tomorrow and to see Dr. [name] on Thursday. She wanted to make sure she had the correct times/places for appts. Called back and spoke with Nevin Bloodgood, confirmed both appt times per schedule in chart.

## 2017-12-10 ENCOUNTER — Inpatient Hospital Stay: Payer: Medicare Other | Attending: Hematology | Admitting: Hematology

## 2017-12-10 ENCOUNTER — Telehealth: Payer: Self-pay | Admitting: *Deleted

## 2017-12-10 ENCOUNTER — Ambulatory Visit (HOSPITAL_COMMUNITY)
Admission: RE | Admit: 2017-12-10 | Discharge: 2017-12-10 | Disposition: A | Discharge status: Home / Self Care | Payer: Medicare Other | Source: Ambulatory Visit | Attending: Hematology | Admitting: Hematology

## 2017-12-10 VITALS — BP 146/116 | HR 90 | Temp 98.2°F | Resp 18 | Ht 60.0 in | Wt 151.1 lb

## 2017-12-10 DIAGNOSIS — R062 Wheezing: Secondary | ICD-10-CM | POA: Insufficient documentation

## 2017-12-10 DIAGNOSIS — C7951 Secondary malignant neoplasm of bone: Secondary | ICD-10-CM | POA: Insufficient documentation

## 2017-12-10 DIAGNOSIS — R042 Hemoptysis: Secondary | ICD-10-CM

## 2017-12-10 DIAGNOSIS — Z794 Long term (current) use of insulin: Secondary | ICD-10-CM | POA: Insufficient documentation

## 2017-12-10 DIAGNOSIS — R634 Abnormal weight loss: Secondary | ICD-10-CM | POA: Diagnosis not present

## 2017-12-10 DIAGNOSIS — G893 Neoplasm related pain (acute) (chronic): Secondary | ICD-10-CM | POA: Diagnosis not present

## 2017-12-10 DIAGNOSIS — C781 Secondary malignant neoplasm of mediastinum: Secondary | ICD-10-CM

## 2017-12-10 DIAGNOSIS — Z800 Family history of malignant neoplasm of digestive organs: Secondary | ICD-10-CM | POA: Insufficient documentation

## 2017-12-10 DIAGNOSIS — E119 Type 2 diabetes mellitus without complications: Secondary | ICD-10-CM | POA: Diagnosis not present

## 2017-12-10 DIAGNOSIS — C3492 Malignant neoplasm of unspecified part of left bronchus or lung: Secondary | ICD-10-CM

## 2017-12-10 DIAGNOSIS — I10 Essential (primary) hypertension: Secondary | ICD-10-CM | POA: Diagnosis not present

## 2017-12-10 DIAGNOSIS — C801 Malignant (primary) neoplasm, unspecified: Secondary | ICD-10-CM

## 2017-12-10 DIAGNOSIS — M545 Low back pain: Secondary | ICD-10-CM

## 2017-12-10 DIAGNOSIS — R110 Nausea: Secondary | ICD-10-CM | POA: Insufficient documentation

## 2017-12-10 DIAGNOSIS — C3490 Malignant neoplasm of unspecified part of unspecified bronchus or lung: Secondary | ICD-10-CM

## 2017-12-10 DIAGNOSIS — Z7984 Long term (current) use of oral hypoglycemic drugs: Secondary | ICD-10-CM | POA: Diagnosis not present

## 2017-12-10 DIAGNOSIS — R05 Cough: Secondary | ICD-10-CM | POA: Insufficient documentation

## 2017-12-10 DIAGNOSIS — Z791 Long term (current) use of non-steroidal anti-inflammatories (NSAID): Secondary | ICD-10-CM | POA: Diagnosis not present

## 2017-12-10 DIAGNOSIS — C3412 Malignant neoplasm of upper lobe, left bronchus or lung: Secondary | ICD-10-CM | POA: Insufficient documentation

## 2017-12-10 DIAGNOSIS — Z7902 Long term (current) use of antithrombotics/antiplatelets: Secondary | ICD-10-CM | POA: Diagnosis not present

## 2017-12-10 DIAGNOSIS — Z79899 Other long term (current) drug therapy: Secondary | ICD-10-CM | POA: Diagnosis not present

## 2017-12-10 LAB — GLUCOSE, CAPILLARY: Glucose-Capillary: 137 mg/dL — ABNORMAL HIGH (ref 70–99)

## 2017-12-10 MED ORDER — DEXAMETHASONE 4 MG PO TABS: ORAL_TABLET | ORAL | 0 refills | Status: DC

## 2017-12-10 MED ORDER — FENTANYL 12 MCG/HR TD PT72: 12.0000 ug | MEDICATED_PATCH | TRANSDERMAL | 0 refills | Status: DC

## 2017-12-10 MED ORDER — FLUDEOXYGLUCOSE F - 18 (FDG) INJECTION
7.6100 | Freq: Once | INTRAVENOUS | Status: AC
  Administered 2017-12-10: 7.61 via INTRAVENOUS

## 2017-12-10 MED ORDER — HYDROMORPHONE HCL 2 MG PO TABS: 2.0000 mg | ORAL_TABLET | ORAL | 0 refills | Status: DC | PRN

## 2017-12-10 NOTE — Telephone Encounter (Signed)
Per Dr. [name] - He can see patient today 12/3 @ 3:20 instead of appointment on 12/5 if possible for patient to be here at 3:20PM. Contacted family - they will bring patient today at 3:20.

## 2017-12-10 NOTE — Telephone Encounter (Signed)
Family member came to [facility] while patient having PET scan at [facility]. Per family member, patient is constantly crying in pain and medication  (Dilaudid 2 mg po q6h) given in ED Saturday not helping. Vicodin prescribed by Dr. [name] was not helping per this family member and the patient stopped taking that.  Asking for either different pain medication or higher dose of medication to be prescribed.

## 2017-12-10 NOTE — Progress Notes (Signed)
HEMATOLOGY/ONCOLOGY CONSULTATION NOTE  Date of Service: 12/10/2017  Patient Care Team: Seward Carol, MD as PCP - General (Internal Medicine)  CHIEF COMPLAINTS/PURPOSE OF CONSULTATION:  Metastatic Disease of Unknown Primary  HISTORY OF PRESENTING ILLNESS:   Elizabeth Calhoun is a wonderful 72 y.o. female who has been referred to [place] by Dr. Seward Carol  for evaluation and management of metastatic lesions. She is accompanied today by her two daughters and her niece. The pt reports that she is doing well overall.   The pt notes that two years ago she developed lower back pain which has presented intermittently until 6 months ago when it presented unceasingly. She notes lower back pain and left lower extremity pain, however the left leg pain is not constant but does endorse positional exacerbation. She also describes a sensation of pins and needles all over her back that presented episodically twice, one week apart. The pt has used a back brace occasionally which has helped reduce her pain.   The pt also developed a cough about 6 months ago and was switched off her BP medicationn (ACEI). She notes that her cough catches her off guard. She received Z pack and Toradol as well. She notes that she coughed up streaks of bright red blood two weeks ago, on one occasion. She denies any headaches, changes in speech, or changes in vision. The pt notes that she has had some abdominal pain when bending over, and has occasional diarrhea which she attributes to Metformin. She denies any blood in the stools or black stools. She notes that her last colonoscopy was 6-7 years ago and revealed only polyps.   She denies any vaginal bleeding or discharge. The pt had a partial hysterectomy in 1990 for fibroids. Her last mammogram was negative and was completed on 07/04/17.   The pt notes that she has had some loss of appetite and has lost 20 pounds, possibly more, in the last weeks to couple months. She notes that  she feels cold more often than she used to.   The pt has no current limitations, noting that she lives at home independently and is able to keep up with her chores and all of her daily activities.   She notes that she had a stroke in 2011, which occurred in her sleep. She had weakness in her right upper extremity at the time, which improved and normalized in the subsequent years after PT. She denies any remaining limitations. She continues on Plavix.   Of note prior to the patient's visit today, pt has had MRI Lumbar completed on 11/15/17 with results revealing Widespread marrow lesions consistent with metastatic disease. 2. Pathologic endplate fractures at L2. 3. Concerning left leg radiculopathy, there is far-lateral tumor infiltration at L2 which could affect extraforaminal nerve roots. No epidural tumor extension.  Most recent lab results (06/12/17) of CBC w/diff and CMP is as follows: all values are WNL except for HGB at 11.6, Creatinine at 1.06, ALT at 9, GFR at 51.  On review of systems, pt reports weaker appetite, cough, one episode of coughing blood, positional abdominal pain, left leg pain, lower back pain, 20 pounds weight loss, and denies vaginal bleeding, abnormal vaginal discharge, blood in the stools, black stools, headaches, and any other symtoms.   On PMHx the pt reports well controlled HTN, arthritis, hyperlipidemia, stroke in 2011, partial hysterectomy in 1990, 2005 left shoulder surgery, arthroscopic right knee surgery in 1996. On Social Hx the pt denies ever smoking, and hasn't had  alcohol in 15 years, previously social ETOH consumption  On Family Hx the pt reports CAD, mother with colon cancer above 81 y/o, brother with stomach cancer, and denies other cancer.  Interval History:   Elizabeth Calhoun returns today for management and evaluation of her metastatic cancer of unknown primary, concerning for lung cancer. The patient's last visit with [place] was on 11/28/17. She is accompanied  today by two of her daughters and her niece. The pt reports that she is doing well overall.   The pt reports that she is having pain in her right shoulder blade, and her lower back pain has worsened as well, for which she presented to the ED on 12/07/17. She was discharged with 77m Dilaudid and is taking 4 Dilaudid a day without notable symptomatic relief, and denies any drowsiness or confusion. The patient continues coughing but notes that she has not coughed up blood in the interim. The pt notes that her coughing is worse at night, when she is lying down. She also notes that she has some coughing when she eats. The pt also endorses an occasional wheezing sensation that interrupts her as she speaks.   The pt notes that she is eating better and her appetite is somewhat improved. She has not lost any weight in the brief interim.  The pt notes that earlier in life she had a fair amount of second hand smoke exposure, however she has never smoked cigarettes herself. She notes a fair amount of exposure to cleaning chemicals.   Of note since the patient's last visit, pt has had a PET/CT completed on 12/10/17 with results revealing Large LEFT upper lobe nodule most consistent with bronchogenic Carcinoma. 2. LEFT suprahilar nodal mass which obstructs the LEFT upper lobe bronchus and extends into the AP window consistent with nodal Metastasis. 3. Additional nodal metastasis in the mediastinum. 4. Distant skeletal metastasis involving the proximal femur, sacrum, multiple lesions in the thoracic and lumbar spine, ribs, and scapula. Favor skeletal metastasis from bronchogenic carcinoma over myeloma.  Lab results (12/07/17) of CBC w/diff is as follows: all values are WNL except for WBC at 10.7k, ANC at 8.5k, Abs immature granulocytes at 0.10k, Glucose at 170. 11/28/17 SFLC revealed Kappa at 29.3, Lambda at 26.3, and K:L ratio at 1.11 11/28/17 MMP revealed all values WNL except for Alpha2 Glob at 1.1  On review of  systems, pt reports persisting cough, worsened back pain, eating well, stable weight, and denies any other symptoms.   MEDICAL HISTORY:  Past Medical History:  Diagnosis Date  . Adenomatous colon polyp   . [place]   . Hemorrhoids   . Menopause   . OA (osteoarthritis)   . Obesity   . Other and unspecified hyperlipidemia   . Stroke ([facility]     SURGICAL HISTORY: Past Surgical History:  Procedure Laterality Date  . SHOULDER SURGERY  2005    SOCIAL HISTORY: Social History   Socioeconomic History  . Marital status: Married    Spouse name: Not on file  . Number of children: 3  . Years of education: Not on file  . Highest education level: Not on file  Occupational History  . Occupation: works at [employer] . Financial resource strain: Not on file  . Food insecurity:    Worry: Not on file    Inability: Not on file  . Transportation needs:    Medical: Not on file    Non-medical: Not on file  Tobacco  Use  . Smoking status: Never Smoker  . Smokeless tobacco: Never Used  Substance and Sexual Activity  . Alcohol use: Yes    Comment: occasionally  . Drug use: No  . Sexual activity: Not on file  Lifestyle  . Physical activity:    Days per week: Not on file    Minutes per session: Not on file  . Stress: Not on file  Relationships  . Social connections:    Talks on phone: Not on file    Gets together: Not on file    Attends religious service: Not on file    Active member of club or organization: Not on file    Attends meetings of clubs or organizations: Not on file    Relationship status: Not on file  . Intimate partner violence:    Fear of current or ex partner: Not on file    Emotionally abused: Not on file    Physically abused: Not on file    Forced sexual activity: Not on file  Other Topics Concern  . Not on file  Social History Narrative  . Not on file    FAMILY HISTORY: Family History  Problem Relation Age of  Onset  . Heart attack Father   . Heart disease Brother        1/8  . Heart disease Brother        2/8  . Heart disease Brother        3/8  . Heart disease Brother        4/8  . Heart disease Brother        5/8  . Heart disease Brother        6/8  . Cancer Brother        7/8  . Seizures Brother        8/8  . Stroke Brother        8/8  . Heart disease Sister        1/6  . Alzheimer's disease Sister        2/6  . Diabetes Sister        3/6  . Hypertension Sister        3/6  . Hypertension Sister        4/6  . Diabetes Sister        4/6  . Diabetes Daughter     ALLERGIES:  is allergic to aspirin.  MEDICATIONS:  Current Outpatient Medications  Medication Sig Dispense Refill  . ASPERCREME LIDOCAINE EX Apply 1 application topically daily as needed (muscle aches).    Marland Kitchen azithromycin (ZITHROMAX) 250 MG tablet Take 1 tablet daily (Patient not taking: Reported on 11/28/2017) 4 each 0  . benzonatate (TESSALON) 100 MG capsule Take 1 capsule (100 mg total) by mouth 3 (three) times daily as needed for cough. (Patient not taking: Reported on 12/10/2017) 50 capsule 1  . clopidogrel (PLAVIX) 75 MG tablet Take 75 mg by mouth daily.  0  . diphenhydrAMINE (BENADRYL) 25 mg capsule Take 25 mg by mouth daily.    . diphenhydramine-acetaminophen (TYLENOL PM) 25-500 MG TABS tablet Take 1 tablet by mouth at bedtime as needed (sleep).    . hydrochlorothiazide (HYDRODIURIL) 25 MG tablet Take 25 mg by mouth daily.  0  . HYDROcodone-acetaminophen (NORCO/VICODIN) 5-325 MG tablet Take 1 tablet by mouth every 6 (six) hours as needed for moderate pain or severe pain. (Patient not taking: Reported on 12/07/2017) 30 tablet 0  . HYDROmorphone (DILAUDID) 2 MG  tablet Take 1 tablet (2 mg total) by mouth every 6 (six) hours as needed for up to 5 days for severe pain. 20 tablet 0  . insulin lispro (HUMALOG) 100 UNIT/ML injection Inject 10 Units into the skin at bedtime.    Marland Kitchen JARDIANCE 25 MG TABS tablet Take 25  mg by mouth daily.  0  . loratadine (CLARITIN) 10 MG tablet Take 1 tablet (10 mg total) by mouth daily. (Patient not taking: Reported on 12/07/2017) 30 tablet 1  . losartan (COZAAR) 25 MG tablet Take 25 mg by mouth daily.    . meclizine (ANTIVERT) 25 MG tablet Take 1 tablet (25 mg total) by mouth 3 (three) times daily as needed for dizziness. (Patient not taking: Reported on 12/07/2017) 30 tablet 0  . meloxicam (MOBIC) 7.5 MG tablet Take 1 tablet (7.5 mg total) by mouth 2 (two) times daily as needed for pain. (Patient not taking: Reported on 12/07/2017) 10 tablet 0  . meloxicam (MOBIC) 7.5 MG tablet Take 1 tablet (7.5 mg total) by mouth 2 (two) times daily as needed for pain. (Patient not taking: Reported on 11/28/2017) 30 tablet 2  . metFORMIN (GLUCOPHAGE) 1000 MG tablet Take 1,000 mg by mouth 2 (two) times daily with a meal.    . methocarbamol (ROBAXIN) 500 MG tablet Take 1 tablet (500 mg total) by mouth every 6 (six) hours as needed for muscle spasms. (Patient not taking: Reported on 12/07/2017) 30 tablet 2  . ondansetron (ZOFRAN ODT) 4 MG disintegrating tablet Take 1 tablet (4 mg total) by mouth every 8 (eight) hours as needed for nausea or vomiting. 20 tablet 0  . pioglitazone (ACTOS) 45 MG tablet Take 45 mg by mouth at bedtime.  0  . predniSONE (STERAPRED UNI-PAK 21 TAB) 10 MG (21) TBPK tablet Take as directed (Patient not taking: Reported on 12/07/2017) 21 tablet 0  . rosuvastatin (CRESTOR) 40 MG tablet Take 40 mg by mouth daily.  0  . traMADol (ULTRAM) 50 MG tablet Take 1 tablet (50 mg total) by mouth every 6 (six) hours as needed. (Patient not taking: Reported on 12/07/2017) 10 tablet 0  . zolpidem (AMBIEN) 10 MG tablet Take 10 mg by mouth at bedtime.      No current facility-administered medications for this visit.     REVIEW OF SYSTEMS:    A 10+ POINT REVIEW OF SYSTEMS WAS OBTAINED including neurology, dermatology, psychiatry, cardiac, respiratory, lymph, extremities, GI, GU,  Musculoskeletal, constitutional, breasts, reproductive, HEENT.  All pertinent positives are noted in the HPI.  All others are negative.   PHYSICAL EXAMINATION: ECOG PERFORMANCE STATUS: 2 - Symptomatic, <50% confined to bed  . Vitals:   12/10/17 1516  BP: (!) 146/116  Pulse: 90  Resp: 18  Temp: 98.2 F (36.8 C)  SpO2: 93%   Filed Weights   12/10/17 1516  Weight: 151 lb 1.6 oz (68.5 kg)   .Body mass index is 29.51 kg/m.   GENERAL:alert, in no acute distress and comfortable SKIN: no acute rashes, no significant lesions EYES: conjunctiva are pink and non-injected, sclera anicteric OROPHARYNX: MMM, no exudates, no oropharyngeal erythema or ulceration NECK: supple, no JVD LYMPH:  no palpable lymphadenopathy in the cervical, axillary or inguinal regions LUNGS: clear to auscultation b/l with normal respiratory effort HEART: regular rate & rhythm ABDOMEN:  normoactive bowel sounds , non tender, not distended. No palpable hepatosplenomegaly.  Extremity: no pedal edema PSYCH: alert & oriented x 3 with fluent speech NEURO: no focal motor/sensory deficits  LABORATORY DATA:  I have reviewed the data as listed  . CBC Latest Ref Rng & Units 12/07/2017 12/04/2017 11/28/2017  WBC 4.0 - 10.5 K/uL 10.7(H) 8.8 10.4  Hemoglobin 12.0 - 15.0 g/dL 12.1 11.1(L) 11.3(L)  Hematocrit 36.0 - 46.0 % 38.6 36.2 36.0  Platelets 150 - 400 K/uL 354 318 281    . CMP Latest Ref Rng & Units 12/07/2017 11/28/2017 06/12/2017  Glucose 70 - 99 mg/dL 170(H) 94 94  BUN 8 - 23 mg/dL _0 Creatinine 0.44 - 1.00 mg/dL 0.80 0.95 1.06(H)  Sodium 135 - 145 mmol/L 140 141 142  Potassium 3.5 - 5.1 mmol/L 3.7 3.8 3.7  Chloride 98 - 111 mmol/L 102 103 106  CO2 22 - 32 mmol/L _1 Calcium 8.9 - 10.3 mg/dL 9.6 10.1 9.6  Total Protein 6.5 - 8.1 g/dL - 8.0 7.7  Total Bilirubin 0.3 - 1.2 mg/dL - 0.6 0.8  Alkaline Phos 38 - 126 U/L - 138(H) 69  AST 15 - 41 U/L - 15 16  ALT 0 - 44 U/L - <6 9(L)   . Lab  Results  Component Value Date   LDH 389 (H) 11/28/2017    12/04/17 BM Bx:    RADIOGRAPHIC STUDIES: I have personally reviewed the radiological images as listed and agreed with the findings in the report. Dg Chest 2 View  Result Date: 12/07/2017 CLINICAL DATA:  Unbearable right shoulder pain. EXAM: CHEST - 2 VIEW COMPARISON:  06/12/2017 FINDINGS: The cardiac silhouette is normal. Calcific atherosclerotic disease and tortuosity of the aorta. Spiculated left hilar/suprahilar mass. There is no evidence of scratch pleural effusion or pneumothorax. Peribronchial thickening in the left upper lobe. Volume loss in the left hemithorax. Question lytic lesions in the lower thoracic spine. Soft tissues are grossly normal. IMPRESSION: Spiculated left hilar/suprahilar mass with peribronchial thickening in the left upper lobe. The findings are suspicious for primary lung malignancy. Further evaluation with chest CT or PET-CT is recommended. These results were called by telephone at the time of interpretation on 12/07/2017 at 11:42 am to Dr. Nanda Quinton , who verbally acknowledged these results. Electronically Signed   By: Fidela Salisbury M.D.   On: 12/07/2017 11:44   Dg Shoulder Right  Result Date: 12/07/2017 CLINICAL DATA:  Right shoulder pain. Widespread marrow lesions in the lumbar spine on recent MRI. EXAM: RIGHT SHOULDER - 2+ VIEW COMPARISON:  None. FINDINGS: No fracture. No right acromioclavicular separation. No right glenohumeral dislocation. No suspicious focal osseous lesions. Moderate acromioclavicular joint and mild glenohumeral joint osteoarthritis. No pathologic soft tissue calcifications. IMPRESSION: 1. No right shoulder fracture or malalignment. No suspicious focal osseous lesions. 2. Moderate AC joint and mild glenohumeral joint osteoarthritis. Electronically Signed   By: Ilona Sorrel M.D.   On: 12/07/2017 11:40   Ct Chest W Contrast  Result Date: 12/07/2017 CLINICAL DATA:  Evaluate lung  mass. EXAM: CT CHEST WITH CONTRAST TECHNIQUE: Multidetector CT imaging of the chest was performed during intravenous contrast administration. CONTRAST:  62m OMNIPAQUE IOHEXOL 300 MG/ML  SOLN COMPARISON:  None. FINDINGS: Cardiovascular: Mild cardiac enlargement. Aortic atherosclerosis. Three vessel coronary artery atherosclerotic calcifications noted. Mediastinum/Nodes: Normal appearance of the thyroid gland. The trachea appears patent and is midline. Normal appearance of the esophagus. No axillary or supraclavicular adenopathy. No enlarged right paratracheal, subcarinal or right hilar adenopathy. AP window mass measures 3.8 x 4.2 x 2.4 cm, image 46/2. This completely encases and occludes the left pulmonary artery, image 50/2. There is encasement  and occlusion of the left upper lobe bronchi, image 53/2. Partial encasement of the lingular bronchi and left lower lobe bronchi noted. Enlarged left hilar lymph node measures 1.5 cm, image 60/2. Lungs/Pleura: Mild thickening of the pleura in the left posterior costophrenic sulcus noted. Mild scattered areas of pleural nodularity are noted. No pleural fluid identified. Multiple nodules are identified within the left upper lobe. Dominant nodule is in the apex measuring 3.1 cm, image 22/5. Upper Abdomen: No acute abnormality. Musculoskeletal: Multifocal lytic bone metastases are identified. -Large lytic lesion involves the T11 vertebral body measuring 2 cm, image 76/7. -Lytic lesion involving the left pedicle of T2 measures 1.5 cm, image 7/5. -Lytic lesion involving the lamina of the C7 vertebra on the right is noted, image 1/5. IMPRESSION: 1. Left apical lung mass is identified worrisome for primary bronchogenic tumor. Associated left AP window nodal mass is identified which occludes the left pulmonary artery and left upper lobe bronchus. Enlarged left hilar lymph node also noted. Multiple pulmonary nodules are identified within the left upper lobe and there is diffuse  pleural nodularity overlying the left lung worrisome for pleural spread of disease. Multifocal bone metastases are identified including a large lytic lesion involving the T11 vertebra. 2.  Aortic Atherosclerosis (ICD10-I70.0). 3. Multi vessel coronary artery atherosclerotic calcifications. Electronically Signed   By: Kerby Moors M.D.   On: 12/07/2017 13:04   Mr Lumbar Spine W Wo Contrast  Result Date: 11/15/2017 CLINICAL DATA:  Left radicular back pain EXAM: MRI LUMBAR SPINE WITHOUT AND WITH CONTRAST TECHNIQUE: Multiplanar and multiecho pulse sequences of the lumbar spine were obtained without and with intravenous contrast. Creatinine was obtained on site at [place] at [address] Results: Creatinine 1 mg/dL. CONTRAST:  24m MULTIHANCE GADOBENATE DIMEGLUMINE 529 MG/ML IV SOLN COMPARISON:  03/08/2005 FINDINGS: Segmentation: 5 lumbar type vertebral bodies based on the lowest ribs Alignment:  Slight anterolisthesis at L3-4 Vertebrae: Sparing L4, from T11 to L5 there are infiltrating marrow lesions consistent with metastatic disease. The largest is in the L2 body where there is pathologic inferior and superior endplate fractures with mild height loss. The bilateral sacrum and S2 body is also involved. There is left far-lateral tumor infiltration at L2, best seen on precontrast axial T1 weighted imaging, which could affect far-lateral nerve roots/lumbar plexus. No epidural tumor extension is seen. Conus medullaris and cauda equina: Conus extends to the L1-2 level. Conus and cauda equina appear normal. Paraspinal and other soft tissues: Negative Disc levels: Generalized disc narrowing and bulging most notable at L4-5 and L5-S1 where this causes foraminal crowding. There is also facet spurring most notable at L3-4 where there is mild anterolisthesis. These results will be called to the ordering clinician or representative by the Radiologist Assistant, and communication documented in the PACS or  zVision Dashboard. IMPRESSION: 1. Widespread marrow lesions consistent with metastatic disease. 2. Pathologic endplate fractures at L2. 3. Concerning left leg radiculopathy, there is far-lateral tumor infiltration at L2 which could affect extraforaminal nerve roots. No epidural tumor extension. Electronically Signed   By: JMonte FantasiaM.D.   On: 11/15/2017 13:14   Nm Pet Image Initial (pi) Whole Body  Result Date: 12/10/2017 CLINICAL DATA:  Initial treatment strategy for metastatic carcinoma versus myeloma. EXAM: NUCLEAR MEDICINE PET WHOLE BODY TECHNIQUE: 7.6 mCi F-18 FDG was injected intravenously. Full-ring PET imaging was performed from the skull base to thigh after the radiotracer. CT data was obtained and used for attenuation correction and anatomic localization. Fasting blood  glucose: 136 mg/dl COMPARISON:  Lumbar MRI 11/15/2017, CT chest _0 FINDINGS: Mediastinal blood pool activity: SUV max 2.1 HEAD/NECK: Hypermetabolic LEFT posterior triangle (level 5) lymph node with SUV max equal 7.1. Lymph node measures 12 mm on image 63/4 Incidental CT findings: none CHEST: Hypermetabolic LEFT upper lobe nodule measures 2.5 cm with SUV max equal 19.1. Peripheral nodularity in the RIGHT upper lobe with minimal metabolic activity likely represents postobstructive pneumonitis. Hypermetabolic LEFT suprahilar mass which extends into the AP window and surrounds the LEFT upper lobe bronchus. LEFT suprahilar mass measures 4.6 cm SUV max equal 23.6 There are hypermetabolic subcarinal and prevascular lymph nodes. Incidental CT findings: Small LEFT effusion. ABDOMEN/PELVIS: No abnormal hypermetabolic activity within the liver, pancreas, adrenal glands, or spleen. No hypermetabolic lymph nodes in the abdomen or pelvis. Incidental CT findings: Post hysterectomy SKELETON: Multiple sites of hypermetabolic activity in the axillary and appendicular skeleton consistent widespread skeletal metastasis. Many of these lesions are  lytic. For example lesion in the posterior RIGHT sacral ala with SUV max equal 16.9. Lytic lesion in the mid RIGHT iliac bone with SUV max equal 12.1 measures 18 mm on image 161/4. LEFT inferior trochanter femur lesion with SUV max equal 12.7. Multiple lesions in the spine which also lytic including L5, L3, L2, and T 10. Multiple rib lesions involved additionally Intense lesion in the RIGHT transverse process of the T3 vertebral body with SUV max equal 12.3. Lesion in the LEFT scapula intensely hypermetabolic SUV max equal 9.4 Incidental CT findings: none EXTREMITIES: Proximal LEFT femur hypermetabolic lesion. Incidental CT findings: LEFT IMPRESSION: 1. Large LEFT upper lobe nodule most consistent with bronchogenic carcinoma. 2. LEFT suprahilar nodal mass which obstructs the LEFT upper lobe bronchus and extends into the AP window consistent with nodal metastasis. 3. Additional nodal metastasis in the mediastinum. 4. Distant skeletal metastasis involving the proximal femur, sacrum, multiple lesions in the thoracic and lumbar spine, ribs, and scapula. Favor skeletal metastasis from bronchogenic carcinoma over myeloma. Electronically Signed   By: Suzy Bouchard M.D.   On: 12/10/2017 12:16   Ct Biopsy  Result Date: 12/04/2017 CLINICAL DATA:  Bone lesions by lumbar spine MRI. EXAM: CT GUIDED BONE MARROW ASPIRATION AND BIOPSY ANESTHESIA/SEDATION: Versed 2.0 mg IV, Fentanyl 100 mcg IV Total Moderate Sedation Time:   13 minutes. The patient's level of consciousness and physiologic status were continuously monitored during the procedure by Radiology nursing. PROCEDURE: The procedure risks, benefits, and alternatives were explained to the patient. Questions regarding the procedure were encouraged and answered. The patient understands and consents to the procedure. A time out was performed prior to initiating the procedure. The right gluteal region was prepped with chlorhexidine. Sterile gown and sterile gloves were  used for the procedure. Local anesthesia was provided with 1% Lidocaine. Under CT guidance, an 11 gauge On Control bone cutting needle was advanced from a posterior approach into the right iliac bone. Needle positioning was confirmed with CT. Initial non heparinized and heparinized aspirate samples were obtained of bone marrow. Core biopsy was performed via the On Control drill needle. COMPLICATIONS: None FINDINGS: Inspection of initial aspirate did reveal visible particles. Intact core biopsy sample was obtained. IMPRESSION: CT guided bone marrow biopsy of right posterior iliac bone with both aspirate and core samples obtained. Electronically Signed   By: Aletta Edouard M.D.   On: 12/04/2017 12:52   Dg Shoulder Left  Result Date: 12/07/2017 CLINICAL DATA:  Left shoulder pain. Widespread marrow lesions throughout the lumbar spine on  recent MRI. EXAM: LEFT SHOULDER - 2+ VIEW COMPARISON:  None. FINDINGS: No fracture. No left acromioclavicular separation. No left glenohumeral dislocation. No suspicious focal osseous lesions. Moderate left acromioclavicular and left glenohumeral osteoarthritis. No pathologic soft tissue calcifications. IMPRESSION: 1. No left shoulder fracture or malalignment. No suspicious focal osseous lesions. 2. Moderate left AC joint and left glenohumeral joint osteoarthritis. Electronically Signed   By: Ilona Sorrel M.D.   On: 12/07/2017 11:39   Ct Bone Marrow Biopsy & Aspiration  Result Date: 12/04/2017 CLINICAL DATA:  Bone lesions by lumbar spine MRI. EXAM: CT GUIDED BONE MARROW ASPIRATION AND BIOPSY ANESTHESIA/SEDATION: Versed 2.0 mg IV, Fentanyl 100 mcg IV Total Moderate Sedation Time:   13 minutes. The patient's level of consciousness and physiologic status were continuously monitored during the procedure by Radiology nursing. PROCEDURE: The procedure risks, benefits, and alternatives were explained to the patient. Questions regarding the procedure were encouraged and answered. The  patient understands and consents to the procedure. A time out was performed prior to initiating the procedure. The right gluteal region was prepped with chlorhexidine. Sterile gown and sterile gloves were used for the procedure. Local anesthesia was provided with 1% Lidocaine. Under CT guidance, an 11 gauge On Control bone cutting needle was advanced from a posterior approach into the right iliac bone. Needle positioning was confirmed with CT. Initial non heparinized and heparinized aspirate samples were obtained of bone marrow. Core biopsy was performed via the On Control drill needle. COMPLICATIONS: None FINDINGS: Inspection of initial aspirate did reveal visible particles. Intact core biopsy sample was obtained. IMPRESSION: CT guided bone marrow biopsy of right posterior iliac bone with both aspirate and core samples obtained. Electronically Signed   By: Aletta Edouard M.D.   On: 12/04/2017 12:52    ASSESSMENT & PLAN:   72 y.o. female with  1. Metastatic bone lesions of unknown primary, concerning for lung primary   11/15/17 MRI Lumbar revealed Widespread marrow lesions consistent with metastatic disease. 2. Pathologic endplate fractures at L2. 3. Concerning left leg radiculopathy, there is far-lateral tumor infiltration at L2 which could affect extraforaminal nerve roots. No epidural tumor extension.   2. Cough with hemoptysis  3. Weight loss  PLAN -Patient's labs upon presentation from 06/12/17, mild anemia with HGB at 11.6, no other cytopenias. -Discussed pt labwork from 12/07/17; ANC at 8.5k, no anemia, normal platelets -Discussed the 12/10/17 PET/CT which revealed Large LEFT upper lobe nodule most consistent with bronchogenic Carcinoma. 2. LEFT suprahilar nodal mass which obstructs the LEFT upper lobe bronchus and extends into the AP window consistent with nodal Metastasis. 3. Additional nodal metastasis in the mediastinum. 4. Distant skeletal metastasis involving the proximal femur, sacrum,  multiple lesions in the thoracic and lumbar spine, ribs, and scapula. Favor skeletal metastasis from bronchogenic carcinoma over myeloma.  -Discussed the 12/04/17 BM Bx which revealed no evidence of malignancy -Discussed that the 12/10/17 PET/CT is primarily concerning for lung primary with bone mets, which would mean that options for treatment would not be curative, but palliative  -Will consult with radiologist regarding best location to biopsy, possible cervical lymph node, or may need a bronchoscopy with pulmonology  -Will order MRI Brain for complete staging and treatment planning -Discussed that the patient has the option at any time to focus purely on comfort measures  -Treatment will be directed towards patient's most bothersome symptoms of bone pain from spinal involvement at T3 and cough from airway involvement by lung tumor. -Emphasized that treatment routes will need to  be driven by the patient's explicitly stated goals of care  -Will send out molecular studies to evaluate for targetable mutations as the patient has never smoked cigarettes -Advised that the pt limit her pushing, pulling, bending or lifting to reduce the risk of a pathologic fracture -Will refer the pt to Rad Onc for T3 related pain -Will order Fentanyl patch, and pt will continue with 2-67m Dilaudid prn for break through pain -Will order Dexamethasone for bone pain -Will begin Xgeva injections every 4 weeks -Did order Tessalon pearls prn for cough . Orders Placed This Encounter  Procedures  . MR Brain W Wo Contrast    Standing Status:   Future    Standing Expiration Date:   12/10/2018    Order Specific Question:   ** REASON FOR EXAM (FREE TEXT)    Answer:   patient with newly diagnosed metastatic lung cancer for intial staging. Known bone metastases    Order Specific Question:   If indicated for the ordered procedure, I authorize the administration of contrast media per Radiology protocol    Answer:   Yes    Order  Specific Question:   What is the patient's sedation requirement?    Answer:   No Sedation    Order Specific Question:   Does the patient have a pacemaker or implanted devices?    Answer:   No    Order Specific Question:   Use SRS Protocol?    Answer:   No    Order Specific Question:   Radiology Contrast Protocol - do NOT remove file path    Answer:   [email or web address]    Order Specific Question:   [facility]    [facility]  . [facility]    Standing Status:   Future    Standing Expiration Date:   12/10/2018    Order Specific Question:   Lab orders requested (DO NOT place separate lab orders, these will be automatically ordered during procedure specimen collection):    Answer:   Surgical Pathology    Comments:   foundation one for lung cancer, PDL1    Order Specific Question:   Lab orders requested (DO NOT place separate lab orders, these will be automatically ordered during procedure specimen collection):    Answer:   Other    Order Specific Question:   Reason for Exam (SYMPTOM  OR DIAGNOSIS REQUIRED)    Answer:   Likely metastatic lung cancer - consider CT guided biopsy of lung lesions vs left neck lesion vs bone lesion for tissue diagnosis and molecular testing/PDL1 testing    Order Specific Question:   [facility]    [facility] to Radiation Oncology    Referral Priority:   Urgent    Referral Type:   Consultation    Referral Reason:   Specialty Services Required    Requested Specialty:   Radiation Oncology    Number of Visits Requested:   1   -MRI brain for lung cancer staging -CT biopsy of left neck LN or lung mass for tissue diagnosis ASAP in 1-2 days -radiation oncology referral for consideration of palliative RT to T3 and sacral for pain control -RTC with Dr KIrene Limboin 7-10 days -Xgeva to start in 7-10 days on clinic  f/u  All of the patients questions were answered with apparent satisfaction. The patient knows to call the clinic with any  problems, questions or concerns.  The total time spent in the appt was 45 minutes and more than 50% was on counseling and direct patient cares.    Sullivan Lone MD MS AAHIVMS [facility] [place] Hematology/Oncology Physician [facility]  (Office):       [phone] (Work cell):  [phone] (Fax):           [phone]  12/10/2017 4:42 PM  I, Baldwin [place], am acting as a scribe for Dr. Sullivan Lone.   .I have reviewed the above documentation for accuracy and completeness, and I agree with the above. Brunetta Genera MD

## 2017-12-11 ENCOUNTER — Telehealth: Payer: Self-pay | Admitting: *Deleted

## 2017-12-11 ENCOUNTER — Telehealth: Payer: Self-pay

## 2017-12-11 NOTE — Telephone Encounter (Signed)
Spoke with patient concerning her  upcoming appointment. Per 12/3 los. Mailed a letter with a calender enclosed

## 2017-12-11 NOTE — Telephone Encounter (Signed)
Janae Sauce (patient's daughter) called for clarification on medication prescribed by MD following mother's appt 12/10/17. Reviewed meds sent to pharmacy with daughter - dexamethasone, fentanyl patch and po dilaudid. Daughter asked about "medicine for bones". Advised her that MD los stated that Delton See would be started in 8-10 days. Daughter verbalized understanding.

## 2017-12-12 ENCOUNTER — Telehealth: Payer: Self-pay | Admitting: *Deleted

## 2017-12-12 ENCOUNTER — Ambulatory Visit: Payer: Medicare Other | Admitting: Hematology

## 2017-12-12 ENCOUNTER — Other Ambulatory Visit: Payer: Medicare Other

## 2017-12-12 NOTE — Telephone Encounter (Signed)
Contacted by Aniceto Boss in Radiology. Needs order to hold Plavix for 5 days in order to schedule biopsy that is ordered. Contacted Tasha - per Dr. [name]: Prefer not to hold due to hisotry of CVA, may hold if absolutely necessary. Information given to [facility]. She states she will contact patient to determine Plavix prescriber and contact that prescriber for further orders.

## 2017-12-13 NOTE — Progress Notes (Signed)
Histology and Location of Primary Cancer: Metastatic Disease of Unknown Primary  Sites of Visceral and Bony Metastatic Disease: Per PET scan 12/10/17:  IMPRESSION: 1. Large LEFT upper lobe nodule most consistent with bronchogenic carcinoma. 2. LEFT suprahilar nodal mass which obstructs the LEFT upper lobe bronchus and extends into the AP window consistent with nodal metastasis. 3. Additional nodal metastasis in the mediastinum. 4. Distant skeletal metastasis involving the proximal femur, sacrum, multiple lesions in the thoracic and lumbar spine, ribs, and scapula. Favor skeletal metastasis from bronchogenic carcinoma over myeloma.  Location(s) of Symptomatic Metastases: Intense lesion in the RIGHT transverse process of the T3 vertebral body with SUV max equal 12.3.  Past/Anticipated chemotherapy by medical oncology, if any: Per Dr. [name] 12/10/17:  ASSESSMENT & PLAN:   72 y.o. female with  1. Metastatic bone lesions of unknown primary, concerning for lung primary  R/o Myeloma R/o metastatic carcinoma with unknown primary  11/15/17 MRI Lumbar revealed Widespread marrow lesions consistent with metastatic disease. 2. Pathologic endplate fractures at L2. 3. Concerning left leg radiculopathy, there is far-lateral tumor infiltration at L2 which could affect extraforaminal nerve roots. No epidural tumor extension.   2. Cough with hemoptysis  3. Weight loss  PLAN -Patient's labs upon presentation from 06/12/17, mild anemia with HGB at 11.6, no other cytopenias. -Discussed pt labwork from 12/07/17; ANC at 8.5k, no anemia, normal platelets -Discussed the 12/10/17 PET/CT which revealed Large LEFT upper lobe nodule most consistent with bronchogenic Carcinoma. 2. LEFT suprahilar nodal mass which obstructs the LEFT upper lobe bronchus and extends into the AP window consistent with nodal Metastasis. 3. Additional nodal metastasis in the mediastinum. 4. Distant skeletal metastasis involving the  proximal femur, sacrum, multiple lesions in the thoracic and lumbar spine, ribs, and scapula. Favor skeletal metastasis from bronchogenic carcinoma over myeloma.  -Discussed the 12/04/17 BM Bx which revealed no evidence of malignancy -Discussed that the 12/10/17 PET/CT is primarily concerning for lung primary with bone mets, which would mean that options for treatment would not be curative, but palliative  -Will speak with radiologist regarding best location to biopsy, possible cervical lymph node, or may need a bronchoscopy with pulmonology  -Will order MRI Brain for complete staging and treatment planning -Discussed that the patient has the option at any time to focus purely on comfort measures  -Treatment will be directed towards patient's most bothersome symptoms of bone pain from spinal involvement and cough -Emphasized that treatment routes will need to be driven by the patient's explicitly stated goals of care  -Will send out molecular studies to evaluate for targetable mutations as the patient has never smoked cigarettes -Advised that the pt limit her pushing, pulling, bending or lifting to reduce the risk of a pathologic fracture -Will refer the pt to Rad Onc for T3 related pain -Will order Fentanyl patch, and pt will continue with 31m Dilaudid for break through pain -Will order Dexamethasone -Will begin Xgeva injections every 4 weeks -Did order Tessalon pearls prn for cough  Pain on a scale of 0-10 is: pt c/o pain in back, rated 2/10. Pain relieved by warm blanket and repositioning.   If Spine Met(s), symptoms, if any, include:  Bowel/Bladder retention or incontinence (please describe): Pt denies bladder/bowel retention or incontinence. Pt reports bladder urgency.   Numbness or weakness in extremities (please describe): She notes lower back pain and left lower extremity pain, however the left leg pain is not constant but does endorse positional exacerbation. She also describes a  sensation of  pins and needles all over her back that presented episodically twice, one week apart. The pt has used a back brace occasionally which has helped reduce her pain.   Current Decadron regimen, if applicable: 1 53m tablet with breakfast  Ambulatory status? Walker? Wheelchair?: Ambulatory  SAFETY ISSUES:  Prior radiation? No  Pacemaker/ICD? No  Possible current pregnancy? No, pt is post-surgical  Is the patient on methotrexate? No  Current Complaints / other details:  Pt presents today for initial consult with Dr. KSondra Comefor Radiation Oncology. Pt is accompanied by 2 daughters and 1 niece.   BP (!) 146/80 (BP Location: Left Arm, Patient Position: Sitting)   Pulse 79   Temp 99.1 F (37.3 C) (Oral)   Resp 20   Ht 5' (1.524 m)   Wt 149 lb (67.6 kg)   SpO2 100%   BMI 29.10 kg/m   Wt Readings from Last 3 Encounters:  12/16/17 149 lb (67.6 kg)  12/10/17 151 lb 1.6 oz (68.5 kg)  11/28/17 152 lb 1.6 oz (69 kg)   JLoma Sousa RN BSN

## 2017-12-16 ENCOUNTER — Ambulatory Visit
Admission: RE | Admit: 2017-12-16 | Discharge: 2017-12-16 | Disposition: A | Discharge status: Home / Self Care | Payer: Medicare Other | Source: Ambulatory Visit | Attending: Radiation Oncology | Admitting: Radiation Oncology

## 2017-12-16 ENCOUNTER — Other Ambulatory Visit: Payer: Self-pay

## 2017-12-16 ENCOUNTER — Encounter: Payer: Self-pay | Admitting: Radiation Oncology

## 2017-12-16 DIAGNOSIS — E785 Hyperlipidemia, unspecified: Secondary | ICD-10-CM | POA: Insufficient documentation

## 2017-12-16 DIAGNOSIS — R7989 Other specified abnormal findings of blood chemistry: Secondary | ICD-10-CM | POA: Diagnosis not present

## 2017-12-16 DIAGNOSIS — M899 Disorder of bone, unspecified: Secondary | ICD-10-CM | POA: Diagnosis not present

## 2017-12-16 DIAGNOSIS — E119 Type 2 diabetes mellitus without complications: Secondary | ICD-10-CM | POA: Insufficient documentation

## 2017-12-16 DIAGNOSIS — Z8673 Personal history of transient ischemic attack (TIA), and cerebral infarction without residual deficits: Secondary | ICD-10-CM | POA: Insufficient documentation

## 2017-12-16 DIAGNOSIS — I700 Atherosclerosis of aorta: Secondary | ICD-10-CM | POA: Diagnosis not present

## 2017-12-16 DIAGNOSIS — M129 Arthropathy, unspecified: Secondary | ICD-10-CM | POA: Diagnosis not present

## 2017-12-16 DIAGNOSIS — R042 Hemoptysis: Secondary | ICD-10-CM | POA: Insufficient documentation

## 2017-12-16 DIAGNOSIS — M545 Low back pain: Secondary | ICD-10-CM | POA: Diagnosis not present

## 2017-12-16 DIAGNOSIS — Z79899 Other long term (current) drug therapy: Secondary | ICD-10-CM | POA: Diagnosis not present

## 2017-12-16 DIAGNOSIS — M8448XA Pathological fracture, other site, initial encounter for fracture: Secondary | ICD-10-CM | POA: Diagnosis not present

## 2017-12-16 DIAGNOSIS — Z86010 Personal history of colonic polyps: Secondary | ICD-10-CM | POA: Diagnosis not present

## 2017-12-16 DIAGNOSIS — C7951 Secondary malignant neoplasm of bone: Secondary | ICD-10-CM | POA: Insufficient documentation

## 2017-12-16 DIAGNOSIS — R918 Other nonspecific abnormal finding of lung field: Secondary | ICD-10-CM | POA: Diagnosis not present

## 2017-12-16 DIAGNOSIS — E669 Obesity, unspecified: Secondary | ICD-10-CM | POA: Diagnosis not present

## 2017-12-16 DIAGNOSIS — Z510 Encounter for antineoplastic radiation therapy: Secondary | ICD-10-CM | POA: Insufficient documentation

## 2017-12-16 DIAGNOSIS — D649 Anemia, unspecified: Secondary | ICD-10-CM | POA: Insufficient documentation

## 2017-12-16 DIAGNOSIS — R590 Localized enlarged lymph nodes: Secondary | ICD-10-CM | POA: Diagnosis not present

## 2017-12-16 DIAGNOSIS — R634 Abnormal weight loss: Secondary | ICD-10-CM | POA: Insufficient documentation

## 2017-12-16 DIAGNOSIS — R112 Nausea with vomiting, unspecified: Secondary | ICD-10-CM | POA: Insufficient documentation

## 2017-12-16 DIAGNOSIS — C801 Malignant (primary) neoplasm, unspecified: Secondary | ICD-10-CM | POA: Diagnosis not present

## 2017-12-16 NOTE — Progress Notes (Signed)
Radiation Oncology         [phone] ________________________________  Initial Outpatient Consultation  Name: Elizabeth Calhoun MRN: [id number]  Date: 12/16/2017  DOB: 1945/04/03  BS:JGGEZM, Jori Moll, MD  Brunetta Genera, MD   REFERRING PHYSICIAN: Brunetta Genera, MD   DIAGNOSIS:  Probable stage IV primary lung cancer (biopsy pending) presenting in the apex of the left lung   HISTORY OF PRESENT ILLNESS::Elizabeth Calhoun is a 72 y.o. female who is presenting to the office today for evaluation of metastatic disease. She is accompanied by two daughters and her niece. It was reported that two years ago she developed lower back pain, which presented intermittently until 6 months ago when it became constant. She reports severe arthritis which she believed was causing her pain initially. She reports having never smoked, but was frequently exposed to second-hand smoke via family members and friends. She sees Dr. [name] in medical oncology.  An initial lumbar MRI with and without contrast on 11/15/17 revealed widespread marrow lesions consistent with metastatic disease, pathologic endplate fractures at L2, and concerning left leg radiculopathy as there was a far-lateral tumor infiltration at L2 which could affect extraforaminal nerve roots (no epidural tumor extension noted at this time).  Accordingly, she underwent a CT guided aspiration and biopsy of bone marrow in question on December 04, 2017. The pathology results showed mildly hypercellular marrow with trilineage hematopoiesis, but no carcinoma was identified. In the peripheral blood, it was noted that mild normocytic anemia was present.   Severe shoulder pain brought her to the ER on December 07, 2017. She proceeded with an investigative DGs of her left and right shoulders, both of which were negative for fractures or malalignment and no suspicious focal osseous lesions were found. She then had a DG of her chest which showed spiculated  left hilar/suprahilar mass with peribronchial thickening in the left upper lobe. The findings were suspicious for primary lung malignancy. Following this, she received a CT of her chest with contrast, which revealed a left apical lung mass worrisome for primary bronchogenic tumor. An associated left AP window nodal mass was identified which occludes the left pulmonary artery and left upper lobe bronchus. Enlarged left hilar lymph node were also noted. Multiple pulmonary nodules were identified within the left upper lobe and there was diffuse pleural nodularity overlying the left lung worrisome for pleural spread of disease. Multifocal bone metastases were identified including a large lytic lesion involving the T11 Vertebra. The CT also showed Aortic Atherosclerosis (ICD10-I70.0) as well as multi vessel coronary artery atherosclerotic calcifications.  Additionally, she received a full-body PET scan on 12/10/17 which confirmed there to be a large left upper lobe nodule most consistent with bronchogenic carcinoma, a left suprahilar nodal mass which obstructs the left upper lobe bronchus and extends into the AP window consistent with nodal metastasis, additional nodal metastasis in the mediastinum, and distant skeletal metastasis involving the proximal femur, sacrum, multiple lesions in the thoracic lumbar spine, ribs and scapula. Pathology noted favor in skeletal metastasis from bronchogenic carcinoma over myeloma.  The pain initially presented exclusively on her left side, but now presents on her right side. She was given a pain patch which has provided relief. She reports weight loss, voice changes, coughing episodes, spitting up blood x2, occasional nausea and vomiting. she denies any other symptoms. She has an appointment with Dr. [name] in medical oncology on 12/12.   PREVIOUS RADIATION THERAPY: No  PAST MEDICAL HISTORY:  has a past medical history of Adenomatous  colon polyp, Diabetes mellitus without  complication ([place]), Hemorrhoids, Menopause, OA (osteoarthritis), Obesity, Other and unspecified hyperlipidemia, and Stroke ([place]).    PAST SURGICAL HISTORY: Past Surgical History:  Procedure Laterality Date  . SHOULDER SURGERY  2005    FAMILY HISTORY: family history includes Alzheimer's disease in her sister; Cancer in her brother; Diabetes in her daughter, sister, and sister; Heart attack in her father; Heart disease in her brother, brother, brother, brother, brother, brother, and sister; Hypertension in her sister and sister; Seizures in her brother; Stroke in her brother.  SOCIAL HISTORY:  reports that she has never smoked. She has never used smokeless tobacco. She reports that she drinks alcohol. She reports that she does not use drugs.  ALLERGIES: Aspirin  MEDICATIONS:  Current Outpatient Medications  Medication Sig Dispense Refill  . ASPERCREME LIDOCAINE EX Apply 1 application topically daily as needed (muscle aches).    Marland Kitchen azithromycin (ZITHROMAX) 250 MG tablet Take 1 tablet daily (Patient not taking: Reported on 11/28/2017) 4 each 0  . benzonatate (TESSALON) 100 MG capsule Take 1 capsule (100 mg total) by mouth 3 (three) times daily as needed for cough. (Patient not taking: Reported on 12/10/2017) 50 capsule 1  . clopidogrel (PLAVIX) 75 MG tablet Take 75 mg by mouth daily.  0  . dexamethasone (DECADRON) 4 MG tablet 1 tab with breakfast and lunch for 3-4 days then 1 tab with breakfast only 30 tablet 0  . diphenhydrAMINE (BENADRYL) 25 mg capsule Take 25 mg by mouth daily.    . diphenhydramine-acetaminophen (TYLENOL PM) 25-500 MG TABS tablet Take 1 tablet by mouth at bedtime as needed (sleep).    . fentaNYL (DURAGESIC - DOSED MCG/HR) 12 MCG/HR Place 1 patch (12.5 mcg total) onto the skin every 3 (three) days. 10 patch 0  . hydrochlorothiazide (HYDRODIURIL) 25 MG tablet Take 25 mg by mouth daily.  0  . HYDROcodone-acetaminophen (NORCO/VICODIN) 5-325 MG tablet Take 1 tablet by mouth every 6  (six) hours as needed for moderate pain or severe pain. (Patient not taking: Reported on 12/07/2017) 30 tablet 0  . HYDROmorphone (DILAUDID) 2 MG tablet Take 1-2 tablets (2-4 mg total) by mouth every 4 (four) hours as needed for severe pain. 60 tablet 0  . insulin lispro (HUMALOG) 100 UNIT/ML injection Inject 10 Units into the skin at bedtime.    Marland Kitchen JARDIANCE 25 MG TABS tablet Take 25 mg by mouth daily.  0  . loratadine (CLARITIN) 10 MG tablet Take 1 tablet (10 mg total) by mouth daily. (Patient not taking: Reported on 12/07/2017) 30 tablet 1  . losartan (COZAAR) 25 MG tablet Take 25 mg by mouth daily.    . meclizine (ANTIVERT) 25 MG tablet Take 1 tablet (25 mg total) by mouth 3 (three) times daily as needed for dizziness. (Patient not taking: Reported on 12/07/2017) 30 tablet 0  . meloxicam (MOBIC) 7.5 MG tablet Take 1 tablet (7.5 mg total) by mouth 2 (two) times daily as needed for pain. (Patient not taking: Reported on 12/07/2017) 10 tablet 0  . meloxicam (MOBIC) 7.5 MG tablet Take 1 tablet (7.5 mg total) by mouth 2 (two) times daily as needed for pain. (Patient not taking: Reported on 11/28/2017) 30 tablet 2  . metFORMIN (GLUCOPHAGE) 1000 MG tablet Take 1,000 mg by mouth 2 (two) times daily with a meal.    . methocarbamol (ROBAXIN) 500 MG tablet Take 1 tablet (500 mg total) by mouth every 6 (six) hours as needed for muscle spasms. (Patient not taking:  Reported on 12/07/2017) 30 tablet 2  . ondansetron (ZOFRAN ODT) 4 MG disintegrating tablet Take 1 tablet (4 mg total) by mouth every 8 (eight) hours as needed for nausea or vomiting. 20 tablet 0  . pioglitazone (ACTOS) 45 MG tablet Take 45 mg by mouth at bedtime.  0  . predniSONE (STERAPRED UNI-PAK 21 TAB) 10 MG (21) TBPK tablet Take as directed (Patient not taking: Reported on 12/07/2017) 21 tablet 0  . rosuvastatin (CRESTOR) 40 MG tablet Take 40 mg by mouth daily.  0  . traMADol (ULTRAM) 50 MG tablet Take 1 tablet (50 mg total) by mouth every 6  (six) hours as needed. (Patient not taking: Reported on 12/07/2017) 10 tablet 0  . zolpidem (AMBIEN) 10 MG tablet Take 10 mg by mouth at bedtime.      No current facility-administered medications for this encounter.     REVIEW OF SYSTEMS:  A 10+ POINT REVIEW OF SYSTEMS WAS OBTAINED including neurology, dermatology, psychiatry, cardiac, respiratory, lymph, extremities, GI, GU, musculoskeletal, constitutional, reproductive, HEENT. All pertinent positives are noted in the HPI. All others are negative.    PHYSICAL EXAM:  height is 5' (1.524 m) and weight is 149 lb (67.6 kg). Her oral temperature is 99.1 F (37.3 C). Her blood pressure is 146/80 (abnormal) and her pulse is 79. Her respiration is 20 and oxygen saturation is 100%.   General: Alert and oriented, in no acute distress HEENT: Head is normocephalic. Extraocular movements are intact. Amblyopia in left eye. Oropharynx is clear. Neck: Neck is supple, no palpable cervical  lymphadenopathy. Heart: Regular in rate and rhythm with no murmurs, rubs, or gallops. Chest: Clear to auscultation bilaterally, with no rhonchi, wheezes, or rales. Abdomen: Soft, nontender, nondistended, with no rigidity or guarding. Extremities: No cyanosis or edema. Lymphatics: Palpable approximately 1.5 cm node in the left supraclavicular fossa. No other palpable adenopathy. Skin: No concerning lesions. Musculoskeletal: symmetric strength and muscle tone throughout. Neurologic: Cranial nerves II through XII are grossly intact. No obvious focalities. Speech is fluent. Coordination is intact. Psychiatric: Judgment and insight are intact. Affect is appropriate.   ECOG = 1  0 - Asymptomatic (Fully active, able to carry on all predisease activities without restriction)  1 - Symptomatic but completely ambulatory (Restricted in physically strenuous activity but ambulatory and able to carry out work of a light or sedentary nature. For example, light housework, office  work)  2 - Symptomatic, <50% in bed during the day (Ambulatory and capable of all self care but unable to carry out any work activities. Up and about more than 50% of waking hours)  3 - Symptomatic, >50% in bed, but not bedbound (Capable of only limited self-care, confined to bed or chair 50% or more of waking hours)  4 - Bedbound (Completely disabled. Cannot carry on any self-care. Totally confined to bed or chair)  5 - Death   Eustace Pen MM, Creech RH, Tormey DC, et al. [id number]). "Toxicity and response criteria of the [facility] [employer]". [place] Oncol. 5 (6): 649-55  LABORATORY DATA:  Lab Results  Component Value Date   WBC 10.7 (H) 12/07/2017   HGB 12.1 12/07/2017   HCT 38.6 12/07/2017   MCV 94.8 12/07/2017   PLT 354 12/07/2017   NEUTROABS 8.5 (H) 12/07/2017   Lab Results  Component Value Date   NA 140 12/07/2017   K 3.7 12/07/2017   CL 102 12/07/2017   CO2 27 12/07/2017   GLUCOSE 170 (H) 12/07/2017  CREATININE 0.80 12/07/2017   CALCIUM 9.6 12/07/2017      RADIOGRAPHY: Dg Chest 2 View  Result Date: 12/07/2017 CLINICAL DATA:  Unbearable right shoulder pain. EXAM: CHEST - 2 VIEW COMPARISON:  06/12/2017 FINDINGS: The cardiac silhouette is normal. Calcific atherosclerotic disease and tortuosity of the aorta. Spiculated left hilar/suprahilar mass. There is no evidence of scratch pleural effusion or pneumothorax. Peribronchial thickening in the left upper lobe. Volume loss in the left hemithorax. Question lytic lesions in the lower thoracic spine. Soft tissues are grossly normal. IMPRESSION: Spiculated left hilar/suprahilar mass with peribronchial thickening in the left upper lobe. The findings are suspicious for primary lung malignancy. Further evaluation with chest CT or PET-CT is recommended. These results were called by telephone at the time of interpretation on 12/07/2017 at 11:42 am to Dr. Nanda Quinton , who verbally acknowledged these results. Electronically  Signed   By: Fidela Salisbury M.D.   On: 12/07/2017 11:44   Dg Shoulder Right  Result Date: 12/07/2017 CLINICAL DATA:  Right shoulder pain. Widespread marrow lesions in the lumbar spine on recent MRI. EXAM: RIGHT SHOULDER - 2+ VIEW COMPARISON:  None. FINDINGS: No fracture. No right acromioclavicular separation. No right glenohumeral dislocation. No suspicious focal osseous lesions. Moderate acromioclavicular joint and mild glenohumeral joint osteoarthritis. No pathologic soft tissue calcifications. IMPRESSION: 1. No right shoulder fracture or malalignment. No suspicious focal osseous lesions. 2. Moderate AC joint and mild glenohumeral joint osteoarthritis. Electronically Signed   By: Ilona Sorrel M.D.   On: 12/07/2017 11:40   Ct Chest W Contrast  Result Date: 12/07/2017 CLINICAL DATA:  Evaluate lung mass. EXAM: CT CHEST WITH CONTRAST TECHNIQUE: Multidetector CT imaging of the chest was performed during intravenous contrast administration. CONTRAST:  2m OMNIPAQUE IOHEXOL 300 MG/ML  SOLN COMPARISON:  None. FINDINGS: Cardiovascular: Mild cardiac enlargement. Aortic atherosclerosis. Three vessel coronary artery atherosclerotic calcifications noted. Mediastinum/Nodes: Normal appearance of the thyroid gland. The trachea appears patent and is midline. Normal appearance of the esophagus. No axillary or supraclavicular adenopathy. No enlarged right paratracheal, subcarinal or right hilar adenopathy. AP window mass measures 3.8 x 4.2 x 2.4 cm, image 46/2. This completely encases and occludes the left pulmonary artery, image 50/2. There is encasement and occlusion of the left upper lobe bronchi, image 53/2. Partial encasement of the lingular bronchi and left lower lobe bronchi noted. Enlarged left hilar lymph node measures 1.5 cm, image 60/2. Lungs/Pleura: Mild thickening of the pleura in the left posterior costophrenic sulcus noted. Mild scattered areas of pleural nodularity are noted. No pleural fluid  identified. Multiple nodules are identified within the left upper lobe. Dominant nodule is in the apex measuring 3.1 cm, image 22/5. Upper Abdomen: No acute abnormality. Musculoskeletal: Multifocal lytic bone metastases are identified. -Large lytic lesion involves the T11 vertebral body measuring 2 cm, image 76/7. -Lytic lesion involving the left pedicle of T2 measures 1.5 cm, image 7/5. -Lytic lesion involving the lamina of the C7 vertebra on the right is noted, image 1/5. IMPRESSION: 1. Left apical lung mass is identified worrisome for primary bronchogenic tumor. Associated left AP window nodal mass is identified which occludes the left pulmonary artery and left upper lobe bronchus. Enlarged left hilar lymph node also noted. Multiple pulmonary nodules are identified within the left upper lobe and there is diffuse pleural nodularity overlying the left lung worrisome for pleural spread of disease. Multifocal bone metastases are identified including a large lytic lesion involving the T11 vertebra. 2.  Aortic Atherosclerosis (ICD10-I70.0). 3. Multi vessel coronary  artery atherosclerotic calcifications. Electronically Signed   By: Kerby Moors M.D.   On: 12/07/2017 13:04   Nm Pet Image Initial (pi) Whole Body  Result Date: 12/10/2017 CLINICAL DATA:  Initial treatment strategy for metastatic carcinoma versus myeloma. EXAM: NUCLEAR MEDICINE PET WHOLE BODY TECHNIQUE: 7.6 mCi F-18 FDG was injected intravenously. Full-ring PET imaging was performed from the skull base to thigh after the radiotracer. CT data was obtained and used for attenuation correction and anatomic localization. Fasting blood glucose: 136 mg/dl COMPARISON:  Lumbar MRI 11/15/2017, CT chest _0 FINDINGS: Mediastinal blood pool activity: SUV max 2.1 HEAD/NECK: Hypermetabolic LEFT posterior triangle (level 5) lymph node with SUV max equal 7.1. Lymph node measures 12 mm on image 63/4 Incidental CT findings: none CHEST: Hypermetabolic LEFT upper  lobe nodule measures 2.5 cm with SUV max equal 19.1. Peripheral nodularity in the RIGHT upper lobe with minimal metabolic activity likely represents postobstructive pneumonitis. Hypermetabolic LEFT suprahilar mass which extends into the AP window and surrounds the LEFT upper lobe bronchus. LEFT suprahilar mass measures 4.6 cm SUV max equal 23.6 There are hypermetabolic subcarinal and prevascular lymph nodes. Incidental CT findings: Small LEFT effusion. ABDOMEN/PELVIS: No abnormal hypermetabolic activity within the liver, pancreas, adrenal glands, or spleen. No hypermetabolic lymph nodes in the abdomen or pelvis. Incidental CT findings: Post hysterectomy SKELETON: Multiple sites of hypermetabolic activity in the axillary and appendicular skeleton consistent widespread skeletal metastasis. Many of these lesions are lytic. For example lesion in the posterior RIGHT sacral ala with SUV max equal 16.9. Lytic lesion in the mid RIGHT iliac bone with SUV max equal 12.1 measures 18 mm on image 161/4. LEFT inferior trochanter femur lesion with SUV max equal 12.7. Multiple lesions in the spine which also lytic including L5, L3, L2, and T 10. Multiple rib lesions involved additionally Intense lesion in the RIGHT transverse process of the T3 vertebral body with SUV max equal 12.3. Lesion in the LEFT scapula intensely hypermetabolic SUV max equal 9.4 Incidental CT findings: none EXTREMITIES: Proximal LEFT femur hypermetabolic lesion. Incidental CT findings: LEFT IMPRESSION: 1. Large LEFT upper lobe nodule most consistent with bronchogenic carcinoma. 2. LEFT suprahilar nodal mass which obstructs the LEFT upper lobe bronchus and extends into the AP window consistent with nodal metastasis. 3. Additional nodal metastasis in the mediastinum. 4. Distant skeletal metastasis involving the proximal femur, sacrum, multiple lesions in the thoracic and lumbar spine, ribs, and scapula. Favor skeletal metastasis from bronchogenic carcinoma over  myeloma. Electronically Signed   By: Suzy Bouchard M.D.   On: 12/10/2017 12:16   Ct Biopsy  Result Date: 12/04/2017 CLINICAL DATA:  Bone lesions by lumbar spine MRI. EXAM: CT GUIDED BONE MARROW ASPIRATION AND BIOPSY ANESTHESIA/SEDATION: Versed 2.0 mg IV, Fentanyl 100 mcg IV Total Moderate Sedation Time:   13 minutes. The patient's level of consciousness and physiologic status were continuously monitored during the procedure by Radiology nursing. PROCEDURE: The procedure risks, benefits, and alternatives were explained to the patient. Questions regarding the procedure were encouraged and answered. The patient understands and consents to the procedure. A time out was performed prior to initiating the procedure. The right gluteal region was prepped with chlorhexidine. Sterile gown and sterile gloves were used for the procedure. Local anesthesia was provided with 1% Lidocaine. Under CT guidance, an 11 gauge On Control bone cutting needle was advanced from a posterior approach into the right iliac bone. Needle positioning was confirmed with CT. Initial non heparinized and heparinized aspirate samples were obtained of bone  marrow. Core biopsy was performed via the On Control drill needle. COMPLICATIONS: None FINDINGS: Inspection of initial aspirate did reveal visible particles. Intact core biopsy sample was obtained. IMPRESSION: CT guided bone marrow biopsy of right posterior iliac bone with both aspirate and core samples obtained. Electronically Signed   By: Aletta Edouard M.D.   On: 12/04/2017 12:52   Dg Shoulder Left  Result Date: 12/07/2017 CLINICAL DATA:  Left shoulder pain. Widespread marrow lesions throughout the lumbar spine on recent MRI. EXAM: LEFT SHOULDER - 2+ VIEW COMPARISON:  None. FINDINGS: No fracture. No left acromioclavicular separation. No left glenohumeral dislocation. No suspicious focal osseous lesions. Moderate left acromioclavicular and left glenohumeral osteoarthritis. No pathologic  soft tissue calcifications. IMPRESSION: 1. No left shoulder fracture or malalignment. No suspicious focal osseous lesions. 2. Moderate left AC joint and left glenohumeral joint osteoarthritis. Electronically Signed   By: Ilona Sorrel M.D.   On: 12/07/2017 11:39   Ct Bone Marrow Biopsy & Aspiration  Result Date: 12/04/2017 CLINICAL DATA:  Bone lesions by lumbar spine MRI. EXAM: CT GUIDED BONE MARROW ASPIRATION AND BIOPSY ANESTHESIA/SEDATION: Versed 2.0 mg IV, Fentanyl 100 mcg IV Total Moderate Sedation Time:   13 minutes. The patient's level of consciousness and physiologic status were continuously monitored during the procedure by Radiology nursing. PROCEDURE: The procedure risks, benefits, and alternatives were explained to the patient. Questions regarding the procedure were encouraged and answered. The patient understands and consents to the procedure. A time out was performed prior to initiating the procedure. The right gluteal region was prepped with chlorhexidine. Sterile gown and sterile gloves were used for the procedure. Local anesthesia was provided with 1% Lidocaine. Under CT guidance, an 11 gauge On Control bone cutting needle was advanced from a posterior approach into the right iliac bone. Needle positioning was confirmed with CT. Initial non heparinized and heparinized aspirate samples were obtained of bone marrow. Core biopsy was performed via the On Control drill needle. COMPLICATIONS: None FINDINGS: Inspection of initial aspirate did reveal visible particles. Intact core biopsy sample was obtained. IMPRESSION: CT guided bone marrow biopsy of right posterior iliac bone with both aspirate and core samples obtained. Electronically Signed   By: Aletta Edouard M.D.   On: 12/04/2017 12:52      IMPRESSION: Probable stage IV lung cancer (biopsy pending). Pt has a palpable lymph node in the left supraclavicular fossa and would like to obtain pathologic results from this. Pt is quite symptomatic from  her osseous mets and will likely require radiation treatment to the lumbar spine  where she appears to have metastatic disease and left leg radiculopathy. She also has a large lytic metastasis at T11 which will likely require radiation treatment.  May also need tx to the chest in light of her large lesion along the left side resulting in partial collapse of her left upper lung. Pt will also need to be scheduled for an MRI of the brain to complete her staging workup.  PLAN:  1. Excisional biopsy of the left supraclavicular node vs. Large core Needle core.  2. Pt will return later this week for planning of her upcoming radiation therapy.  3. MRI of the brain   ------------------------------------------------  Blair Promise, PhD, MD  This document serves as a record of services personally performed by Gery Pray, MD. It was created on his behalf by Mary-Margaret Loma Messing, a trained medical scribe. The creation of this record is based on the scribe's personal observations and the provider's statements to  them. This document has been checked and approved by the attending provider.

## 2017-12-18 NOTE — Progress Notes (Signed)
HEMATOLOGY/ONCOLOGY CLINIC NOTE  Date of Service: 12/19/2017  Patient Care Team: Elizabeth Carol, MD as PCP - General (Internal Medicine)  CHIEF COMPLAINTS/PURPOSE OF CONSULTATION:  Metastatic Disease with likely lung primary  HISTORY OF PRESENTING ILLNESS:   Elizabeth Calhoun is a wonderful 72 y.o. female who has been referred to [place] by Dr. Seward Calhoun  for evaluation and management of metastatic lesions. She is accompanied today by her two daughters and her niece. The pt reports that she is doing well overall.   The pt notes that two years ago she developed lower back pain which has presented intermittently until 6 months ago when it presented unceasingly. She notes lower back pain and left lower extremity pain, however the left leg pain is not constant but does endorse positional exacerbation. She also describes a sensation of pins and needles all over her back that presented episodically twice, one week apart. The pt has used a back brace occasionally which has helped reduce her pain.   The pt also developed a cough about 6 months ago and was switched off her BP medicationn (ACEI). She notes that her cough catches her off guard. She received Z pack and Toradol as well. She notes that she coughed up streaks of bright red blood two weeks ago, on one occasion. She denies any headaches, changes in speech, or changes in vision. The pt notes that she has had some abdominal pain when bending over, and has occasional diarrhea which she attributes to Metformin. She denies any blood in the stools or black stools. She notes that her last colonoscopy was 6-7 years ago and revealed only polyps.   She denies any vaginal bleeding or discharge. The pt had a partial hysterectomy in 1990 for fibroids. Her last mammogram was negative and was completed on 07/04/17.   The pt notes that she has had some loss of appetite and has lost 20 pounds, possibly more, in the last weeks to couple months. She notes that  she feels cold more often than she used to.   The pt has no current limitations, noting that she lives at home independently and is able to keep up with her chores and all of her daily activities.   She notes that she had a stroke in 2011, which occurred in her sleep. She had weakness in her right upper extremity at the time, which improved and normalized in the subsequent years after PT. She denies any remaining limitations. She continues on Plavix.   Of note prior to the patient's visit today, pt has had MRI Lumbar completed on 11/15/17 with results revealing Widespread marrow lesions consistent with metastatic disease. 2. Pathologic endplate fractures at L2. 3. Concerning left leg radiculopathy, there is far-lateral tumor infiltration at L2 which could affect extraforaminal nerve roots. No epidural tumor extension.  Most recent lab results (06/12/17) of CBC w/diff and CMP is as follows: all values are WNL except for HGB at 11.6, Creatinine at 1.06, ALT at 9, GFR at 51.  On review of systems, pt reports weaker appetite, cough, one episode of coughing blood, positional abdominal pain, left leg pain, lower back pain, 20 pounds weight loss, and denies vaginal bleeding, abnormal vaginal discharge, blood in the stools, black stools, headaches, and any other symtoms.   On PMHx the pt reports well controlled HTN, arthritis, hyperlipidemia, stroke in 2011, partial hysterectomy in 1990, 2005 left shoulder surgery, arthroscopic right knee surgery in 1996. On Social Hx the pt denies ever smoking, and hasn't  had alcohol in 15 years, previously social ETOH consumption  On Family Hx the pt reports CAD, mother with colon cancer above 74 y/o, brother with stomach cancer, and denies other cancer.  Interval History:   Elizabeth Calhoun returns today for management and evaluation of her metastatic cancer of unknown primary, concerning for lung cancer. The patient's last visit with [place] was on 12/10/17. She is accompanied  today by five members of her family. The pt reports that she is doing well overall.   In the interim, the patient was able to meet with Dr. Gery Pray in [place] on 12/16/17.   The pt reports that she has not received a biopsy yet as she has been awaiting insurance approval.   She notes that her pain has been very well controlled with her Fentanyl patch, and she is using 91m Dilaudid three times per day. The pt denies any confusion at this time, though she notes "flashes of people or animals," which she recognizes as not being physically present. She denies these being too intrusive or bothersome.   She notes that her cough has improved as well and she has not coughed up any blood. She is continuing to use Tessalon pearls with relief as well.  Lab results today (12/19/17) of CBC w/diff and CMP is as follows: all values are WNL except for WBC at 16.7k, HGB at 11.4, HCT at 35.2, ANC at 14.1k, Abs immature granulocytes at 0.20k, Glucose at 207, Creatinine at 1.30, Albumin at 3.3, AST at 11, Alk Phos at 226, GFR at 47.  On review of systems, pt reports improved cough, controlled back pain, and denies abdominal pains, leg swelling, fevers, chills, night sweats, and concerns of infections.   MEDICAL HISTORY:  Past Medical History:  Diagnosis Date  . Adenomatous colon polyp   . Diabetes mellitus without complication ([place]   . Hemorrhoids   . Menopause   . OA (osteoarthritis)   . Obesity   . Other and unspecified hyperlipidemia   . Stroke ([facility]     SURGICAL HISTORY: Past Surgical History:  Procedure Laterality Date  . HYSTERECTOMY ABDOMINAL WITH SALPINGO-OOPHORECTOMY  1990  . KNEE ARTHROSCOPY Left 2009  . SHOULDER SURGERY  2005    SOCIAL HISTORY: Social History   Socioeconomic History  . Marital status: Married    Spouse name: Not on file  . Number of children: 3  . Years of education: Not on file  . Highest education level: Not on file  Occupational History  . Occupation: works  at [employer] . Financial resource strain: Not on file  . Food insecurity:    Worry: Not on file    Inability: Not on file  . Transportation needs:    Medical: Not on file    Non-medical: Not on file  Tobacco Use  . Smoking status: Never Smoker  . Smokeless tobacco: Never Used  Substance and Sexual Activity  . Alcohol use: Yes    Comment: occasionally  . Drug use: No  . Sexual activity: Not on file  Lifestyle  . Physical activity:    Days per week: Not on file    Minutes per session: Not on file  . Stress: Not on file  Relationships  . Social connections:    Talks on phone: Not on file    Gets together: Not on file    Attends religious service: Not on file    Active member of club or organization: Not on file  Attends meetings of clubs or organizations: Not on file    Relationship status: Not on file  . Intimate partner violence:    Fear of current or ex partner: Not on file    Emotionally abused: Not on file    Physically abused: Not on file    Forced sexual activity: Not on file  Other Topics Concern  . Not on file  Social History Narrative  . Not on file    FAMILY HISTORY: Family History  Problem Relation Age of Onset  . Heart attack Father   . Heart disease Brother        1/8  . Heart disease Brother        2/8  . Heart disease Brother        3/8  . Heart disease Brother        4/8  . Heart disease Brother        5/8  . Heart disease Brother        6/8  . Cancer Brother        7/8  . Seizures Brother        8/8  . Stroke Brother        8/8  . Heart disease Sister        1/6  . Alzheimer's disease Sister        2/6  . Diabetes Sister        3/6  . Hypertension Sister        3/6  . Hypertension Sister        4/6  . Diabetes Sister        4/6  . Diabetes Daughter     ALLERGIES:  is allergic to aspirin.  MEDICATIONS:  Current Outpatient Medications  Medication Sig Dispense Refill  . ASPERCREME LIDOCAINE EX Apply 1  application topically daily as needed (muscle aches).    Marland Kitchen azithromycin (ZITHROMAX) 250 MG tablet Take 1 tablet daily (Patient not taking: Reported on 11/28/2017) 4 each 0  . benzonatate (TESSALON) 100 MG capsule Take 1 capsule (100 mg total) by mouth 3 (three) times daily as needed for cough. 50 capsule 1  . clopidogrel (PLAVIX) 75 MG tablet Take 75 mg by mouth daily.  0  . dexamethasone (DECADRON) 4 MG tablet 1 tab with breakfast and lunch for 3-4 days then 1 tab with breakfast only 30 tablet 0  . diphenhydrAMINE (BENADRYL) 25 mg capsule Take 25 mg by mouth daily.    . diphenhydramine-acetaminophen (TYLENOL PM) 25-500 MG TABS tablet Take 1 tablet by mouth at bedtime as needed (sleep).    . fentaNYL (DURAGESIC - DOSED MCG/HR) 12 MCG/HR Place 1 patch (12.5 mcg total) onto the skin every 3 (three) days. 10 patch 0  . hydrochlorothiazide (HYDRODIURIL) 25 MG tablet Take 25 mg by mouth daily.  0  . HYDROmorphone (DILAUDID) 2 MG tablet Take 1-2 tablets (2-4 mg total) by mouth every 4 (four) hours as needed for severe pain. 60 tablet 0  . insulin lispro (HUMALOG) 100 UNIT/ML injection Inject 10 Units into the skin at bedtime.    Marland Kitchen JARDIANCE 25 MG TABS tablet Take 25 mg by mouth daily.  0  . losartan (COZAAR) 25 MG tablet Take 25 mg by mouth daily.    . metFORMIN (GLUCOPHAGE) 1000 MG tablet Take 1,000 mg by mouth 2 (two) times daily with a meal.    . ondansetron (ZOFRAN ODT) 4 MG disintegrating tablet Take 1 tablet (4 mg total) by  mouth every 8 (eight) hours as needed for nausea or vomiting. 20 tablet 0  . pioglitazone (ACTOS) 45 MG tablet Take 45 mg by mouth at bedtime.  0  . rosuvastatin (CRESTOR) 40 MG tablet Take 40 mg by mouth daily.  0  . zolpidem (AMBIEN) 10 MG tablet Take 10 mg by mouth at bedtime.      No current facility-administered medications for this visit.    Facility-Administered Medications Ordered in Other Visits  Medication Dose Route Frequency Provider Last Rate Last Dose  .  denosumab (XGEVA) injection 120 mg  120 mg Subcutaneous Once Brunetta Genera, MD        REVIEW OF SYSTEMS:    A 10+ POINT REVIEW OF SYSTEMS WAS OBTAINED including neurology, dermatology, psychiatry, cardiac, respiratory, lymph, extremities, GI, GU, Musculoskeletal, constitutional, breasts, reproductive, HEENT.  All pertinent positives are noted in the HPI.  All others are negative.   PHYSICAL EXAMINATION: ECOG PERFORMANCE STATUS: 2 - Symptomatic, <50% confined to bed  . Vitals:   12/19/17 1528  BP: (!) 166/84  Pulse: 86  Resp: 18  Temp: 98.6 F (37 C)  SpO2: 94%   Filed Weights   12/19/17 1528  Weight: 149 lb 12.8 oz (67.9 kg)   .Body mass index is 29.26 kg/m.  GENERAL:alert, in no acute distress and comfortable SKIN: no acute rashes, no significant lesions EYES: conjunctiva are pink and non-injected, sclera anicteric OROPHARYNX: MMM, no exudates, no oropharyngeal erythema or ulceration NECK: supple, no JVD LYMPH:  no palpable lymphadenopathy in the cervical, axillary or inguinal regions LUNGS: clear to auscultation b/l with normal respiratory effort HEART: regular rate & rhythm ABDOMEN:  normoactive bowel sounds , non tender, not distended. No palpable hepatosplenomegaly.  Extremity: no pedal edema PSYCH: alert & oriented x 3 with fluent speech NEURO: no focal motor/sensory deficits   LABORATORY DATA:  I have reviewed the data as listed  . CBC Latest Ref Rng & Units 12/19/2017 12/07/2017 12/04/2017  WBC 4.0 - 10.5 K/uL 16.7(H) 10.7(H) 8.8  Hemoglobin 12.0 - 15.0 g/dL 11.4(L) 12.1 11.1(L)  Hematocrit 36.0 - 46.0 % 35.2(L) 38.6 36.2  Platelets 150 - 400 K/uL 381 354 318    . CMP Latest Ref Rng & Units 12/19/2017 12/07/2017 11/28/2017  Glucose 70 - 99 mg/dL 207(H) 170(H) 94  BUN 8 - 23 mg/dL '18 11 11  ' Creatinine 0.44 - 1.00 mg/dL 1.30(H) 0.80 0.95  Sodium 135 - 145 mmol/L 138 140 141  Potassium 3.5 - 5.1 mmol/L 4.0 3.7 3.8  Chloride 98 - 111 mmol/L 101 102  103  CO2 22 - 32 mmol/L '26 27 26  ' Calcium 8.9 - 10.3 mg/dL 9.9 9.6 10.1  Total Protein 6.5 - 8.1 g/dL 7.1 - 8.0  Total Bilirubin 0.3 - 1.2 mg/dL 0.3 - 0.6  Alkaline Phos 38 - 126 U/L 226(H) - 138(H)  AST 15 - 41 U/L 11(L) - 15  ALT 0 - 44 U/L 8 - <6   . Lab Results  Component Value Date   LDH 389 (H) 11/28/2017    12/04/17 BM Bx:    RADIOGRAPHIC STUDIES: I have personally reviewed the radiological images as listed and agreed with the findings in the report. Dg Chest 2 View  Result Date: 12/07/2017 CLINICAL DATA:  Unbearable right shoulder pain. EXAM: CHEST - 2 VIEW COMPARISON:  06/12/2017 FINDINGS: The cardiac silhouette is normal. Calcific atherosclerotic disease and tortuosity of the aorta. Spiculated left hilar/suprahilar mass. There is no evidence of scratch pleural effusion  or pneumothorax. Peribronchial thickening in the left upper lobe. Volume loss in the left hemithorax. Question lytic lesions in the lower thoracic spine. Soft tissues are grossly normal. IMPRESSION: Spiculated left hilar/suprahilar mass with peribronchial thickening in the left upper lobe. The findings are suspicious for primary lung malignancy. Further evaluation with chest CT or PET-CT is recommended. These results were called by telephone at the time of interpretation on 12/07/2017 at 11:42 am to Dr. Nanda Quinton , who verbally acknowledged these results. Electronically Signed   By: Fidela Salisbury M.D.   On: 12/07/2017 11:44   Dg Shoulder Right  Result Date: 12/07/2017 CLINICAL DATA:  Right shoulder pain. Widespread marrow lesions in the lumbar spine on recent MRI. EXAM: RIGHT SHOULDER - 2+ VIEW COMPARISON:  None. FINDINGS: No fracture. No right acromioclavicular separation. No right glenohumeral dislocation. No suspicious focal osseous lesions. Moderate acromioclavicular joint and mild glenohumeral joint osteoarthritis. No pathologic soft tissue calcifications. IMPRESSION: 1. No right shoulder fracture or  malalignment. No suspicious focal osseous lesions. 2. Moderate AC joint and mild glenohumeral joint osteoarthritis. Electronically Signed   By: Ilona Sorrel M.D.   On: 12/07/2017 11:40   Ct Chest W Contrast  Result Date: 12/07/2017 CLINICAL DATA:  Evaluate lung mass. EXAM: CT CHEST WITH CONTRAST TECHNIQUE: Multidetector CT imaging of the chest was performed during intravenous contrast administration. CONTRAST:  69m OMNIPAQUE IOHEXOL 300 MG/ML  SOLN COMPARISON:  None. FINDINGS: Cardiovascular: Mild cardiac enlargement. Aortic atherosclerosis. Three vessel coronary artery atherosclerotic calcifications noted. Mediastinum/Nodes: Normal appearance of the thyroid gland. The trachea appears patent and is midline. Normal appearance of the esophagus. No axillary or supraclavicular adenopathy. No enlarged right paratracheal, subcarinal or right hilar adenopathy. AP window mass measures 3.8 x 4.2 x 2.4 cm, image 46/2. This completely encases and occludes the left pulmonary artery, image 50/2. There is encasement and occlusion of the left upper lobe bronchi, image 53/2. Partial encasement of the lingular bronchi and left lower lobe bronchi noted. Enlarged left hilar lymph node measures 1.5 cm, image 60/2. Lungs/Pleura: Mild thickening of the pleura in the left posterior costophrenic sulcus noted. Mild scattered areas of pleural nodularity are noted. No pleural fluid identified. Multiple nodules are identified within the left upper lobe. Dominant nodule is in the apex measuring 3.1 cm, image 22/5. Upper Abdomen: No acute abnormality. Musculoskeletal: Multifocal lytic bone metastases are identified. -Large lytic lesion involves the T11 vertebral body measuring 2 cm, image 76/7. -Lytic lesion involving the left pedicle of T2 measures 1.5 cm, image 7/5. -Lytic lesion involving the lamina of the C7 vertebra on the right is noted, image 1/5. IMPRESSION: 1. Left apical lung mass is identified worrisome for primary bronchogenic  tumor. Associated left AP window nodal mass is identified which occludes the left pulmonary artery and left upper lobe bronchus. Enlarged left hilar lymph node also noted. Multiple pulmonary nodules are identified within the left upper lobe and there is diffuse pleural nodularity overlying the left lung worrisome for pleural spread of disease. Multifocal bone metastases are identified including a large lytic lesion involving the T11 vertebra. 2.  Aortic Atherosclerosis (ICD10-I70.0). 3. Multi vessel coronary artery atherosclerotic calcifications. Electronically Signed   By: TKerby MoorsM.D.   On: 12/07/2017 13:04   Nm Pet Image Initial (pi) Whole Body  Result Date: 12/10/2017 CLINICAL DATA:  Initial treatment strategy for metastatic carcinoma versus myeloma. EXAM: NUCLEAR MEDICINE PET WHOLE BODY TECHNIQUE: 7.6 mCi F-18 FDG was injected intravenously. Full-ring PET imaging was performed from the skull  base to thigh after the radiotracer. CT data was obtained and used for attenuation correction and anatomic localization. Fasting blood glucose: 136 mg/dl COMPARISON:  Lumbar MRI 11/15/2017, CT chest '11 30 19 ' FINDINGS: Mediastinal blood pool activity: SUV max 2.1 HEAD/NECK: Hypermetabolic LEFT posterior triangle (level 5) lymph node with SUV max equal 7.1. Lymph node measures 12 mm on image 63/4 Incidental CT findings: none CHEST: Hypermetabolic LEFT upper lobe nodule measures 2.5 cm with SUV max equal 19.1. Peripheral nodularity in the RIGHT upper lobe with minimal metabolic activity likely represents postobstructive pneumonitis. Hypermetabolic LEFT suprahilar mass which extends into the AP window and surrounds the LEFT upper lobe bronchus. LEFT suprahilar mass measures 4.6 cm SUV max equal 23.6 There are hypermetabolic subcarinal and prevascular lymph nodes. Incidental CT findings: Small LEFT effusion. ABDOMEN/PELVIS: No abnormal hypermetabolic activity within the liver, pancreas, adrenal glands, or spleen. No  hypermetabolic lymph nodes in the abdomen or pelvis. Incidental CT findings: Post hysterectomy SKELETON: Multiple sites of hypermetabolic activity in the axillary and appendicular skeleton consistent widespread skeletal metastasis. Many of these lesions are lytic. For example lesion in the posterior RIGHT sacral ala with SUV max equal 16.9. Lytic lesion in the mid RIGHT iliac bone with SUV max equal 12.1 measures 18 mm on image 161/4. LEFT inferior trochanter femur lesion with SUV max equal 12.7. Multiple lesions in the spine which also lytic including L5, L3, L2, and T 10. Multiple rib lesions involved additionally Intense lesion in the RIGHT transverse process of the T3 vertebral body with SUV max equal 12.3. Lesion in the LEFT scapula intensely hypermetabolic SUV max equal 9.4 Incidental CT findings: none EXTREMITIES: Proximal LEFT femur hypermetabolic lesion. Incidental CT findings: LEFT IMPRESSION: 1. Large LEFT upper lobe nodule most consistent with bronchogenic carcinoma. 2. LEFT suprahilar nodal mass which obstructs the LEFT upper lobe bronchus and extends into the AP window consistent with nodal metastasis. 3. Additional nodal metastasis in the mediastinum. 4. Distant skeletal metastasis involving the proximal femur, sacrum, multiple lesions in the thoracic and lumbar spine, ribs, and scapula. Favor skeletal metastasis from bronchogenic carcinoma over myeloma. Electronically Signed   By: Suzy Bouchard M.D.   On: 12/10/2017 12:16   Ct Biopsy  Result Date: 12/04/2017 CLINICAL DATA:  Bone lesions by lumbar spine MRI. EXAM: CT GUIDED BONE MARROW ASPIRATION AND BIOPSY ANESTHESIA/SEDATION: Versed 2.0 mg IV, Fentanyl 100 mcg IV Total Moderate Sedation Time:   13 minutes. The patient's level of consciousness and physiologic status were continuously monitored during the procedure by Radiology nursing. PROCEDURE: The procedure risks, benefits, and alternatives were explained to the patient. Questions  regarding the procedure were encouraged and answered. The patient understands and consents to the procedure. A time out was performed prior to initiating the procedure. The right gluteal region was prepped with chlorhexidine. Sterile gown and sterile gloves were used for the procedure. Local anesthesia was provided with 1% Lidocaine. Under CT guidance, an 11 gauge On Control bone cutting needle was advanced from a posterior approach into the right iliac bone. Needle positioning was confirmed with CT. Initial non heparinized and heparinized aspirate samples were obtained of bone marrow. Core biopsy was performed via the On Control drill needle. COMPLICATIONS: None FINDINGS: Inspection of initial aspirate did reveal visible particles. Intact core biopsy sample was obtained. IMPRESSION: CT guided bone marrow biopsy of right posterior iliac bone with both aspirate and core samples obtained. Electronically Signed   By: Aletta Edouard M.D.   On: 12/04/2017 12:52   Dg  Shoulder Left  Result Date: 12/07/2017 CLINICAL DATA:  Left shoulder pain. Widespread marrow lesions throughout the lumbar spine on recent MRI. EXAM: LEFT SHOULDER - 2+ VIEW COMPARISON:  None. FINDINGS: No fracture. No left acromioclavicular separation. No left glenohumeral dislocation. No suspicious focal osseous lesions. Moderate left acromioclavicular and left glenohumeral osteoarthritis. No pathologic soft tissue calcifications. IMPRESSION: 1. No left shoulder fracture or malalignment. No suspicious focal osseous lesions. 2. Moderate left AC joint and left glenohumeral joint osteoarthritis. Electronically Signed   By: Ilona Sorrel M.D.   On: 12/07/2017 11:39   Ct Bone Marrow Biopsy & Aspiration  Result Date: 12/04/2017 CLINICAL DATA:  Bone lesions by lumbar spine MRI. EXAM: CT GUIDED BONE MARROW ASPIRATION AND BIOPSY ANESTHESIA/SEDATION: Versed 2.0 mg IV, Fentanyl 100 mcg IV Total Moderate Sedation Time:   13 minutes. The patient's level of  consciousness and physiologic status were continuously monitored during the procedure by Radiology nursing. PROCEDURE: The procedure risks, benefits, and alternatives were explained to the patient. Questions regarding the procedure were encouraged and answered. The patient understands and consents to the procedure. A time out was performed prior to initiating the procedure. The right gluteal region was prepped with chlorhexidine. Sterile gown and sterile gloves were used for the procedure. Local anesthesia was provided with 1% Lidocaine. Under CT guidance, an 11 gauge On Control bone cutting needle was advanced from a posterior approach into the right iliac bone. Needle positioning was confirmed with CT. Initial non heparinized and heparinized aspirate samples were obtained of bone marrow. Core biopsy was performed via the On Control drill needle. COMPLICATIONS: None FINDINGS: Inspection of initial aspirate did reveal visible particles. Intact core biopsy sample was obtained. IMPRESSION: CT guided bone marrow biopsy of right posterior iliac bone with both aspirate and core samples obtained. Electronically Signed   By: Aletta Edouard M.D.   On: 12/04/2017 12:52    ASSESSMENT & PLAN:   72 y.o. female with  1. Metastatic bone lesions of unknown primary, concerning for lung primary   Patient's labs upon presentation from 06/12/17, mild anemia with HGB at 11.6, no other cytopenias.   11/15/17 MRI Lumbar revealed Widespread marrow lesions consistent with metastatic disease. 2. Pathologic endplate fractures at L2. 3. Concerning left leg radiculopathy, there is far-lateral tumor infiltration at L2 which could affect extraforaminal nerve roots. No epidural tumor extension.   12/10/17 PET/CT revealed Large LEFT upper lobe nodule most consistent with bronchogenic Carcinoma. 2. LEFT suprahilar nodal mass which obstructs the LEFT upper lobe bronchus and extends into the AP window consistent with nodal Metastasis. 3.  Additional nodal metastasis in the mediastinum. 4. Distant skeletal metastasis involving the proximal femur, sacrum, multiple lesions in the thoracic and lumbar spine, ribs, and scapula. Favor skeletal metastasis from bronchogenic carcinoma over myeloma.   12/04/17 BM Bx revealed no evidence of malignancy   2. Cough with hemoptysis  3. Weight loss  PLAN -Discussed pt labwork today, 12/19/17; HGB slightly reduced at 11.4, ANC at 14.1k, Creatinine increased to 1.30, Alk Phos increased to 226 -Will continue Fentanyl 12.30mg patch and 268mDilaudid for break-through pain, and I do expect that RT will alleviate pain, and will monitor for signs of confusion. Also hoping to ween down Fentanyl patch as RT provides symptomatic relief. -Follow up with Dr. KiSondra Comeor RT for T3 related pain -Proceed with biopsy of cervical lymph node, which if possible, is preferred over direct lung biopsy via bronchoscopy -Continue with Xgeva shot every 4 weeks  -Previously discussed that  the 12/10/17 PET/CT is primarily concerning for lung primary with bone mets, which would mean that options for treatment would not be curative, but palliative  -Did order MRI Brain for complete staging and treatment planning -Discussed that the patient has the option at any time to focus purely on comfort measures  -Treatment will be directed towards patient's most bothersome symptoms of bone pain from spinal involvement at T3 and cough from airway involvement by lung tumor. -Emphasized that treatment routes will need to be driven by the patient's explicitly stated goals of care  -Did send out molecular studies to evaluate for targetable mutations as the patient has never smoked cigarettes -Advised that the pt limit her pushing, pulling, bending or lifting to reduce the risk of a pathologic fracture -Continue Dexamethasone for bone pain -Continue Tessalon pearls prn for cough -Will see the pt back in 2 weeks   No orders of the defined  types were placed in this encounter.   -Plz schedule US guided neck LN biopsy for suspected lung cancer ASAP in 1-2 days ordered 12/4 -Plz scheduled MRI Brain ordered 12/4 -RTC with Dr [name] in 2 weeks for f/u of MRI brain and biopsy results.   All of the patients questions were answered with apparent satisfaction. The patient knows to call the clinic with any problems, questions or concerns.  The total time spent in the appt was 25 minutes and more than 50% was on counseling and direct patient cares.    Sullivan Lone MD MS AAHIVMS [facility] Hematology/Oncology Physician [facility]  (Office):       [phone] (Work cell):  [phone] (Fax):           [phone]  12/19/2017 4:21 PM  I, Baldwin Jamaica, am acting as a scribe for Dr. Sullivan Lone.   .I have reviewed the above documentation for accuracy and completeness, and I agree with the above. Brunetta Genera MD

## 2017-12-19 ENCOUNTER — Inpatient Hospital Stay: Payer: Medicare Other

## 2017-12-19 ENCOUNTER — Other Ambulatory Visit: Payer: Self-pay | Admitting: *Deleted

## 2017-12-19 ENCOUNTER — Other Ambulatory Visit: Payer: Self-pay | Admitting: Hematology

## 2017-12-19 ENCOUNTER — Telehealth: Payer: Self-pay

## 2017-12-19 ENCOUNTER — Inpatient Hospital Stay (HOSPITAL_BASED_OUTPATIENT_CLINIC_OR_DEPARTMENT_OTHER): Payer: Medicare Other | Admitting: Hematology

## 2017-12-19 VITALS — BP 166/84 | HR 86 | Temp 98.6°F | Resp 18 | Ht 60.0 in | Wt 149.8 lb

## 2017-12-19 DIAGNOSIS — G893 Neoplasm related pain (acute) (chronic): Secondary | ICD-10-CM | POA: Diagnosis not present

## 2017-12-19 DIAGNOSIS — C7951 Secondary malignant neoplasm of bone: Secondary | ICD-10-CM

## 2017-12-19 DIAGNOSIS — R634 Abnormal weight loss: Secondary | ICD-10-CM

## 2017-12-19 DIAGNOSIS — E559 Vitamin D deficiency, unspecified: Secondary | ICD-10-CM

## 2017-12-19 DIAGNOSIS — C781 Secondary malignant neoplasm of mediastinum: Secondary | ICD-10-CM | POA: Diagnosis not present

## 2017-12-19 DIAGNOSIS — R05 Cough: Secondary | ICD-10-CM

## 2017-12-19 DIAGNOSIS — R110 Nausea: Secondary | ICD-10-CM | POA: Diagnosis not present

## 2017-12-19 DIAGNOSIS — C3492 Malignant neoplasm of unspecified part of left bronchus or lung: Secondary | ICD-10-CM

## 2017-12-19 DIAGNOSIS — C3412 Malignant neoplasm of upper lobe, left bronchus or lung: Secondary | ICD-10-CM | POA: Diagnosis not present

## 2017-12-19 LAB — CBC WITH DIFFERENTIAL (CANCER CENTER ONLY)
Abs Immature Granulocytes: 0.2 10*3/uL — ABNORMAL HIGH (ref 0.00–0.07)
Basophils Absolute: 0 10*3/uL (ref 0.0–0.1)
Basophils Relative: 0 %
Eosinophils Absolute: 0 10*3/uL (ref 0.0–0.5)
Eosinophils Relative: 0 %
HCT: 35.2 % — ABNORMAL LOW (ref 36.0–46.0)
Hemoglobin: 11.4 g/dL — ABNORMAL LOW (ref 12.0–15.0)
Immature Granulocytes: 1 %
Lymphocytes Relative: 9 %
Lymphs Abs: 1.5 10*3/uL (ref 0.7–4.0)
MCH: 29.5 pg (ref 26.0–34.0)
MCHC: 32.4 g/dL (ref 30.0–36.0)
MCV: 91 fL (ref 80.0–100.0)
Monocytes Absolute: 0.8 10*3/uL (ref 0.1–1.0)
Monocytes Relative: 5 %
Neutro Abs: 14.1 10*3/uL — ABNORMAL HIGH (ref 1.7–7.7)
Neutrophils Relative %: 85 %
Platelet Count: 381 10*3/uL (ref 150–400)
RBC: 3.87 MIL/uL (ref 3.87–5.11)
RDW: 12.9 % (ref 11.5–15.5)
WBC Count: 16.7 10*3/uL — ABNORMAL HIGH (ref 4.0–10.5)
nRBC: 0 % (ref 0.0–0.2)

## 2017-12-19 LAB — CMP (CANCER CENTER ONLY)
ALBUMIN: 3.3 g/dL — AB (ref 3.5–5.0)
ALK PHOS: 226 U/L — AB (ref 38–126)
ALT: 8 U/L (ref 0–44)
AST: 11 U/L — ABNORMAL LOW (ref 15–41)
Anion gap: 11 (ref 5–15)
BUN: 18 mg/dL (ref 8–23)
CO2: 26 mmol/L (ref 22–32)
CREATININE: 1.3 mg/dL — AB (ref 0.44–1.00)
Calcium: 9.9 mg/dL (ref 8.9–10.3)
Chloride: 101 mmol/L (ref 98–111)
GFR, Est AFR Am: 47 mL/min — ABNORMAL LOW (ref >60)
GFR, Estimated: 41 mL/min — ABNORMAL LOW (ref >60)
GLUCOSE: 207 mg/dL — AB (ref 70–99)
Potassium: 4 mmol/L (ref 3.5–5.1)
Sodium: 138 mmol/L (ref 135–145)
Total Bilirubin: 0.3 mg/dL (ref 0.3–1.2)
Total Protein: 7.1 g/dL (ref 6.5–8.1)

## 2017-12-19 MED ORDER — DENOSUMAB 120 MG/1.7ML ~~LOC~~ SOLN
120.0000 mg | Freq: Once | SUBCUTANEOUS | Status: AC
  Administered 2017-12-19: 120 mg via SUBCUTANEOUS

## 2017-12-19 NOTE — Telephone Encounter (Signed)
Printed avs and calender of upcoming appointment.  Per 12/12 los

## 2017-12-19 NOTE — Progress Notes (Signed)
Pt is not taking a calcium supplement at the present time. Suggest pt to take a calcium supplement daily. Stated Dr. [name] mentioned TUMS as well. Porsche Cates LPN

## 2017-12-20 ENCOUNTER — Encounter (HOSPITAL_COMMUNITY): Payer: Self-pay | Admitting: Hematology

## 2017-12-20 LAB — VITAMIN D 25 HYDROXY (VIT D DEFICIENCY, FRACTURES): Vit D, 25-Hydroxy: 8.8 ng/mL — ABNORMAL LOW (ref 30.0–100.0)

## 2017-12-23 ENCOUNTER — Ambulatory Visit
Admission: RE | Admit: 2017-12-23 | Discharge: 2017-12-23 | Disposition: A | Discharge status: Home / Self Care | Payer: Medicare Other | Source: Ambulatory Visit | Attending: Radiation Oncology | Admitting: Radiation Oncology

## 2017-12-23 ENCOUNTER — Telehealth: Payer: Self-pay | Admitting: *Deleted

## 2017-12-23 ENCOUNTER — Other Ambulatory Visit: Payer: Self-pay | Admitting: Hematology

## 2017-12-23 DIAGNOSIS — R918 Other nonspecific abnormal finding of lung field: Secondary | ICD-10-CM | POA: Diagnosis not present

## 2017-12-23 DIAGNOSIS — M899 Disorder of bone, unspecified: Secondary | ICD-10-CM | POA: Diagnosis not present

## 2017-12-23 DIAGNOSIS — Z510 Encounter for antineoplastic radiation therapy: Secondary | ICD-10-CM | POA: Diagnosis not present

## 2017-12-23 DIAGNOSIS — M545 Low back pain: Secondary | ICD-10-CM | POA: Diagnosis not present

## 2017-12-23 DIAGNOSIS — C7951 Secondary malignant neoplasm of bone: Secondary | ICD-10-CM | POA: Diagnosis not present

## 2017-12-23 DIAGNOSIS — D649 Anemia, unspecified: Secondary | ICD-10-CM | POA: Diagnosis not present

## 2017-12-23 DIAGNOSIS — M129 Arthropathy, unspecified: Secondary | ICD-10-CM | POA: Diagnosis not present

## 2017-12-23 MED ORDER — LORAZEPAM 0.5 MG PO TABS: 0.5000 mg | ORAL_TABLET | Freq: Once | ORAL | 0 refills | Status: DC | PRN

## 2017-12-23 NOTE — Telephone Encounter (Signed)
Daughter Elizabeth Calhoun - [phone] - called. States her mother was told to call Dr. [name] to have something prescribed to relax her for her Brain MRI on 01/05/18.  Left 2 other call back numbers (other daughters): [phone] and [phone].

## 2017-12-23 NOTE — Telephone Encounter (Signed)
RX sent by MD. Notified Ms. Graves. Verbalized understanding.

## 2017-12-24 ENCOUNTER — Ambulatory Visit
Admission: RE | Admit: 2017-12-24 | Discharge: 2017-12-24 | Disposition: A | Discharge status: Home / Self Care | Payer: Medicare Other | Source: Ambulatory Visit | Attending: Radiation Oncology | Admitting: Radiation Oncology

## 2017-12-24 ENCOUNTER — Telehealth: Payer: Self-pay | Admitting: *Deleted

## 2017-12-24 DIAGNOSIS — M899 Disorder of bone, unspecified: Secondary | ICD-10-CM | POA: Diagnosis not present

## 2017-12-24 DIAGNOSIS — C7951 Secondary malignant neoplasm of bone: Secondary | ICD-10-CM

## 2017-12-24 DIAGNOSIS — M545 Low back pain: Secondary | ICD-10-CM | POA: Diagnosis not present

## 2017-12-24 DIAGNOSIS — D649 Anemia, unspecified: Secondary | ICD-10-CM | POA: Diagnosis not present

## 2017-12-24 DIAGNOSIS — M129 Arthropathy, unspecified: Secondary | ICD-10-CM | POA: Diagnosis not present

## 2017-12-24 DIAGNOSIS — Z510 Encounter for antineoplastic radiation therapy: Secondary | ICD-10-CM | POA: Diagnosis not present

## 2017-12-24 DIAGNOSIS — R918 Other nonspecific abnormal finding of lung field: Secondary | ICD-10-CM | POA: Diagnosis not present

## 2017-12-24 NOTE — Progress Notes (Signed)
  Radiation Oncology         [phone] ________________________________  Name: Elizabeth Calhoun MRN: [id number]  Date: 12/24/2017  DOB: Nov 08, 1945  Simulation Verification Note    ICD-10-CM   1. Bone metastasis ([address]) C79.51     Status: outpatient  NARRATIVE: The patient was brought to the treatment unit and placed in the planned treatment position. The clinical setup was verified. Then port films were obtained and uploaded to the radiation oncology medical record software.  The treatment beams were carefully compared against the planned radiation fields. The position location and shape of the radiation fields was reviewed. They targeted volume of tissue appears to be appropriately covered by the radiation beams. Organs at risk appear to be excluded as planned.  Based on my personal review, I approved the simulation verification. The patient's treatment will proceed as planned.  -----------------------------------  Blair Promise, PhD, MD

## 2017-12-24 NOTE — Telephone Encounter (Signed)
Contacted Radiology at Dr. Grier Mitts request to find out when biopsy is scheduled. Was transferred to [facility] of [place]. Left message with patient info and call back number.

## 2017-12-25 ENCOUNTER — Telehealth: Payer: Self-pay | Admitting: *Deleted

## 2017-12-25 ENCOUNTER — Ambulatory Visit
Admission: RE | Admit: 2017-12-25 | Discharge: 2017-12-25 | Disposition: A | Discharge status: Home / Self Care | Payer: Medicare Other | Source: Ambulatory Visit | Attending: Radiation Oncology | Admitting: Radiation Oncology

## 2017-12-25 DIAGNOSIS — R918 Other nonspecific abnormal finding of lung field: Secondary | ICD-10-CM | POA: Diagnosis not present

## 2017-12-25 DIAGNOSIS — C7951 Secondary malignant neoplasm of bone: Secondary | ICD-10-CM | POA: Diagnosis not present

## 2017-12-25 DIAGNOSIS — M129 Arthropathy, unspecified: Secondary | ICD-10-CM | POA: Diagnosis not present

## 2017-12-25 DIAGNOSIS — Z510 Encounter for antineoplastic radiation therapy: Secondary | ICD-10-CM | POA: Diagnosis not present

## 2017-12-25 DIAGNOSIS — M545 Low back pain: Secondary | ICD-10-CM | POA: Diagnosis not present

## 2017-12-25 DIAGNOSIS — D649 Anemia, unspecified: Secondary | ICD-10-CM | POA: Diagnosis not present

## 2017-12-25 DIAGNOSIS — M899 Disorder of bone, unspecified: Secondary | ICD-10-CM | POA: Diagnosis not present

## 2017-12-25 NOTE — Telephone Encounter (Signed)
Per Dr. [name]: Verbal order: Hold Plavix for 5 days preceding ordered biopsy.  Contacted Tasha in [employer] and left VM with patient information and verbal order.

## 2017-12-26 ENCOUNTER — Ambulatory Visit (HOSPITAL_COMMUNITY)
Admission: RE | Admit: 2017-12-26 | Discharge: 2017-12-26 | Disposition: A | Discharge status: Home / Self Care | Payer: Medicare Other | Source: Ambulatory Visit | Attending: Hematology | Admitting: Hematology

## 2017-12-26 ENCOUNTER — Ambulatory Visit
Admission: RE | Admit: 2017-12-26 | Discharge: 2017-12-26 | Disposition: A | Discharge status: Home / Self Care | Payer: Medicare Other | Source: Ambulatory Visit | Attending: Radiation Oncology | Admitting: Radiation Oncology

## 2017-12-26 DIAGNOSIS — Z510 Encounter for antineoplastic radiation therapy: Secondary | ICD-10-CM | POA: Diagnosis not present

## 2017-12-26 DIAGNOSIS — I6782 Cerebral ischemia: Secondary | ICD-10-CM | POA: Diagnosis not present

## 2017-12-26 DIAGNOSIS — M129 Arthropathy, unspecified: Secondary | ICD-10-CM | POA: Diagnosis not present

## 2017-12-26 DIAGNOSIS — C7951 Secondary malignant neoplasm of bone: Secondary | ICD-10-CM | POA: Insufficient documentation

## 2017-12-26 DIAGNOSIS — C3490 Malignant neoplasm of unspecified part of unspecified bronchus or lung: Secondary | ICD-10-CM | POA: Insufficient documentation

## 2017-12-26 DIAGNOSIS — M545 Low back pain: Secondary | ICD-10-CM | POA: Diagnosis not present

## 2017-12-26 DIAGNOSIS — D649 Anemia, unspecified: Secondary | ICD-10-CM | POA: Diagnosis not present

## 2017-12-26 DIAGNOSIS — R930 Abnormal findings on diagnostic imaging of skull and head, not elsewhere classified: Secondary | ICD-10-CM | POA: Insufficient documentation

## 2017-12-26 DIAGNOSIS — R918 Other nonspecific abnormal finding of lung field: Secondary | ICD-10-CM | POA: Diagnosis not present

## 2017-12-26 DIAGNOSIS — M899 Disorder of bone, unspecified: Secondary | ICD-10-CM | POA: Diagnosis not present

## 2017-12-26 MED ORDER — GADOBUTROL 1 MMOL/ML IV SOLN
6.0000 mL | Freq: Once | INTRAVENOUS | Status: AC | PRN
  Administered 2017-12-26: 6 mL via INTRAVENOUS

## 2017-12-27 ENCOUNTER — Ambulatory Visit
Admission: RE | Admit: 2017-12-27 | Discharge: 2017-12-27 | Disposition: A | Discharge status: Home / Self Care | Payer: Medicare Other | Source: Ambulatory Visit | Attending: Radiation Oncology | Admitting: Radiation Oncology

## 2017-12-27 DIAGNOSIS — D649 Anemia, unspecified: Secondary | ICD-10-CM | POA: Diagnosis not present

## 2017-12-27 DIAGNOSIS — R918 Other nonspecific abnormal finding of lung field: Secondary | ICD-10-CM | POA: Diagnosis not present

## 2017-12-27 DIAGNOSIS — M129 Arthropathy, unspecified: Secondary | ICD-10-CM | POA: Diagnosis not present

## 2017-12-27 DIAGNOSIS — M545 Low back pain: Secondary | ICD-10-CM | POA: Diagnosis not present

## 2017-12-27 DIAGNOSIS — C7951 Secondary malignant neoplasm of bone: Secondary | ICD-10-CM | POA: Diagnosis not present

## 2017-12-27 DIAGNOSIS — Z510 Encounter for antineoplastic radiation therapy: Secondary | ICD-10-CM | POA: Diagnosis not present

## 2017-12-27 DIAGNOSIS — M899 Disorder of bone, unspecified: Secondary | ICD-10-CM | POA: Diagnosis not present

## 2017-12-30 ENCOUNTER — Other Ambulatory Visit: Payer: Self-pay | Admitting: Physician Assistant

## 2017-12-30 ENCOUNTER — Ambulatory Visit
Admission: RE | Admit: 2017-12-30 | Discharge: 2017-12-30 | Disposition: A | Discharge status: Home / Self Care | Payer: Medicare Other | Source: Ambulatory Visit | Attending: Radiation Oncology | Admitting: Radiation Oncology

## 2017-12-30 DIAGNOSIS — M545 Low back pain: Secondary | ICD-10-CM | POA: Diagnosis not present

## 2017-12-30 DIAGNOSIS — Z510 Encounter for antineoplastic radiation therapy: Secondary | ICD-10-CM | POA: Diagnosis not present

## 2017-12-30 DIAGNOSIS — M899 Disorder of bone, unspecified: Secondary | ICD-10-CM | POA: Diagnosis not present

## 2017-12-30 DIAGNOSIS — R918 Other nonspecific abnormal finding of lung field: Secondary | ICD-10-CM | POA: Diagnosis not present

## 2017-12-30 DIAGNOSIS — C7951 Secondary malignant neoplasm of bone: Secondary | ICD-10-CM | POA: Diagnosis not present

## 2017-12-30 DIAGNOSIS — M129 Arthropathy, unspecified: Secondary | ICD-10-CM | POA: Diagnosis not present

## 2017-12-30 DIAGNOSIS — D649 Anemia, unspecified: Secondary | ICD-10-CM | POA: Diagnosis not present

## 2017-12-30 MED ORDER — SONAFINE EX EMUL: 1.0000 "application " | Freq: Two times a day (BID) | CUTANEOUS | Status: DC

## 2017-12-31 ENCOUNTER — Encounter (HOSPITAL_COMMUNITY): Payer: Self-pay

## 2017-12-31 ENCOUNTER — Ambulatory Visit (HOSPITAL_COMMUNITY)
Admission: RE | Admit: 2017-12-31 | Discharge: 2017-12-31 | Disposition: A | Discharge status: Home / Self Care | Payer: Medicare Other | Source: Ambulatory Visit | Attending: Hematology | Admitting: Hematology

## 2017-12-31 ENCOUNTER — Ambulatory Visit
Admission: RE | Admit: 2017-12-31 | Discharge: 2017-12-31 | Disposition: A | Discharge status: Home / Self Care | Payer: Medicare Other | Source: Ambulatory Visit | Attending: Radiation Oncology | Admitting: Radiation Oncology

## 2017-12-31 ENCOUNTER — Other Ambulatory Visit: Payer: Self-pay | Admitting: Physician Assistant

## 2017-12-31 DIAGNOSIS — Z90710 Acquired absence of both cervix and uterus: Secondary | ICD-10-CM | POA: Diagnosis not present

## 2017-12-31 DIAGNOSIS — Z833 Family history of diabetes mellitus: Secondary | ICD-10-CM | POA: Diagnosis not present

## 2017-12-31 DIAGNOSIS — C770 Secondary and unspecified malignant neoplasm of lymph nodes of head, face and neck: Secondary | ICD-10-CM | POA: Insufficient documentation

## 2017-12-31 DIAGNOSIS — R590 Localized enlarged lymph nodes: Secondary | ICD-10-CM | POA: Diagnosis not present

## 2017-12-31 DIAGNOSIS — C3492 Malignant neoplasm of unspecified part of left bronchus or lung: Secondary | ICD-10-CM | POA: Diagnosis not present

## 2017-12-31 DIAGNOSIS — M899 Disorder of bone, unspecified: Secondary | ICD-10-CM | POA: Diagnosis not present

## 2017-12-31 DIAGNOSIS — R918 Other nonspecific abnormal finding of lung field: Secondary | ICD-10-CM | POA: Diagnosis not present

## 2017-12-31 DIAGNOSIS — C801 Malignant (primary) neoplasm, unspecified: Secondary | ICD-10-CM | POA: Diagnosis not present

## 2017-12-31 DIAGNOSIS — Z510 Encounter for antineoplastic radiation therapy: Secondary | ICD-10-CM | POA: Diagnosis not present

## 2017-12-31 DIAGNOSIS — E669 Obesity, unspecified: Secondary | ICD-10-CM | POA: Insufficient documentation

## 2017-12-31 DIAGNOSIS — I700 Atherosclerosis of aorta: Secondary | ICD-10-CM | POA: Diagnosis not present

## 2017-12-31 DIAGNOSIS — Z9889 Other specified postprocedural states: Secondary | ICD-10-CM | POA: Diagnosis not present

## 2017-12-31 DIAGNOSIS — Z794 Long term (current) use of insulin: Secondary | ICD-10-CM | POA: Diagnosis not present

## 2017-12-31 DIAGNOSIS — C7951 Secondary malignant neoplasm of bone: Secondary | ICD-10-CM | POA: Diagnosis not present

## 2017-12-31 DIAGNOSIS — E119 Type 2 diabetes mellitus without complications: Secondary | ICD-10-CM | POA: Diagnosis not present

## 2017-12-31 DIAGNOSIS — M545 Low back pain: Secondary | ICD-10-CM | POA: Diagnosis not present

## 2017-12-31 DIAGNOSIS — Z79899 Other long term (current) drug therapy: Secondary | ICD-10-CM | POA: Diagnosis not present

## 2017-12-31 DIAGNOSIS — M129 Arthropathy, unspecified: Secondary | ICD-10-CM | POA: Diagnosis not present

## 2017-12-31 DIAGNOSIS — D649 Anemia, unspecified: Secondary | ICD-10-CM | POA: Diagnosis not present

## 2017-12-31 LAB — CBC
HCT: 39.1 % (ref 36.0–46.0)
HEMOGLOBIN: 13 g/dL (ref 12.0–15.0)
MCH: 29.6 pg (ref 26.0–34.0)
MCHC: 33.2 g/dL (ref 30.0–36.0)
MCV: 89.1 fL (ref 80.0–100.0)
Platelets: 158 10*3/uL (ref 150–400)
RBC: 4.39 MIL/uL (ref 3.87–5.11)
RDW: 13.2 % (ref 11.5–15.5)
WBC: 11.5 10*3/uL — ABNORMAL HIGH (ref 4.0–10.5)
nRBC: 0 % (ref 0.0–0.2)

## 2017-12-31 LAB — GLUCOSE, CAPILLARY: Glucose-Capillary: 264 mg/dL — ABNORMAL HIGH (ref 70–99)

## 2017-12-31 LAB — PROTIME-INR
INR: 1.05
Prothrombin Time: 13.6 seconds (ref 11.4–15.2)

## 2017-12-31 MED ORDER — FENTANYL CITRATE (PF) 100 MCG/2ML IJ SOLN
INTRAMUSCULAR | Status: AC
  Filled 2017-12-31: qty 2

## 2017-12-31 MED ORDER — MIDAZOLAM HCL 2 MG/2ML IJ SOLN
INTRAMUSCULAR | Status: AC | PRN
  Administered 2017-12-31 (×2): 1 mg via INTRAVENOUS

## 2017-12-31 MED ORDER — MIDAZOLAM HCL 2 MG/2ML IJ SOLN
INTRAMUSCULAR | Status: AC
  Filled 2017-12-31: qty 2

## 2017-12-31 MED ORDER — SODIUM CHLORIDE 0.9 % IV SOLN
INTRAVENOUS | Status: DC
  Administered 2017-12-31: 12:00:00 via INTRAVENOUS

## 2017-12-31 MED ORDER — LIDOCAINE HCL (PF) 1 % IJ SOLN
INTRAMUSCULAR | Status: AC | PRN
  Administered 2017-12-31: 10 mL

## 2017-12-31 MED ORDER — FENTANYL CITRATE (PF) 100 MCG/2ML IJ SOLN
INTRAMUSCULAR | Status: AC | PRN
  Administered 2017-12-31 (×2): 50 ug via INTRAVENOUS

## 2017-12-31 MED ORDER — LIDOCAINE-EPINEPHRINE (PF) 2 %-1:200000 IJ SOLN
INTRAMUSCULAR | Status: AC
  Filled 2017-12-31: qty 20

## 2017-12-31 NOTE — Procedures (Signed)
Pre Procedure Dx: Cervical Lymphadenopathy Post Procedural Dx: Same  Technically successful US guided biopsy of left Cx lymph node.  EBL: None  No immediate complications.   Ronny Bacon, MD Pager #: [phone]

## 2017-12-31 NOTE — Discharge Instructions (Signed)
Needle Biopsy, Care After °These instructions tell you how to care for yourself after your procedure. Your doctor may also give you more specific instructions. Call your doctor if you have any problems or questions. °What can I expect after the procedure? °After the procedure, it is common to have: °· Soreness. °· Bruising. °· Mild pain. °Follow these instructions at home: ° °· Return to your normal activities as told by your doctor. Ask your doctor what activities are safe for you. °· Take over-the-counter and prescription medicines only as told by your doctor. °· Wash your hands with soap and water before you change your bandage (dressing). If you cannot use soap and water, use hand sanitizer. °· Follow instructions from your doctor about: °? How to take care of your puncture site. °? When and how to change your bandage. °? When to remove your bandage. °· Check your puncture site every day for signs of infection. Watch for: °? Redness, swelling, or pain. °? Fluid or blood.  °? Pus or a bad smell. °? Warmth. °· Do not take baths, swim, or use a hot tub until your doctor approves. Ask your doctor if you may take showers. You may only be allowed to take sponge baths. °· Keep all follow-up visits as told by your doctor. This is important. °Contact a doctor if you have: °· A fever. °· Redness, swelling, or pain at the puncture site, and it lasts longer than a few days. °· Fluid, blood, or pus coming from the puncture site. °· Warmth coming from the puncture site. °Get help right away if: °· You have a lot of bleeding from the puncture site. °Summary °· After the procedure, it is common to have soreness, bruising, or mild pain at the puncture site. °· Check your puncture site every day for signs of infection, such as redness, swelling, or pain. °· Get help right away if you have severe bleeding from your puncture site. °This information is not intended to replace advice given to you by your health care provider. Make  sure you discuss any questions you have with your health care provider. °Document Released: 12/08/2007 Document Revised: 01/07/2017 Document Reviewed: 01/07/2017 °Elsevier Interactive Patient Education © 2019 [employer] °Moderate Conscious Sedation, Adult, Care After °These instructions provide you with information about caring for yourself after your procedure. Your health care provider may also give you more specific instructions. Your treatment has been planned according to current medical practices, but problems sometimes occur. Call your health care provider if you have any problems or questions after your procedure. °What can I expect after the procedure? °After your procedure, it is common: °· To feel sleepy for several hours. °· To feel clumsy and have poor balance for several hours. °· To have poor judgment for several hours. °· To vomit if you eat too soon. °Follow these instructions at home: °For at least 24 hours after the procedure: ° °· Do not: °? Participate in activities where you could fall or become injured. °? Drive. °? Use heavy machinery. °? Drink alcohol. °? Take sleeping pills or medicines that cause drowsiness. °? Make important decisions or sign legal documents. °? Take care of children on your own. °· Rest. °Eating and drinking °· Follow the diet recommended by your health care provider. °· If you vomit: °? Drink water, juice, or soup when you can drink without vomiting. °? Make sure you have little or no nausea before eating solid foods. °General instructions °· Have a responsible adult stay   with you until you are awake and alert. °· Take over-the-counter and prescription medicines only as told by your health care provider. °· If you smoke, do not smoke without supervision. °· Keep all follow-up visits as told by your health care provider. This is important. °Contact a health care provider if: °· You keep feeling nauseous or you keep vomiting. °· You feel light-headed. °· You develop a  rash. °· You have a fever. °Get help right away if: °· You have trouble breathing. °This information is not intended to replace advice given to you by your health care provider. Make sure you discuss any questions you have with your health care provider. °Document Released: 10/15/2012 Document Revised: 05/30/2015 Document Reviewed: 04/16/2015 °Elsevier Interactive Patient Education © 2019 [employer] ° °

## 2017-12-31 NOTE — H&P (Signed)
Chief Complaint: Patient was seen in consultation today for left supraclavicular lymph node biopsy.  Referring Physician(s): Brunetta Genera  Supervising Physician: Sandi Mariscal  Patient Status: [facility] - In-pt  History of Present Illness: Elizabeth Calhoun is a 72 y.o. female with a past medical history significant for DM, OA, HLD, CVA (2011) currently on Plavix and metastatic cancer with unknown primary followed by Dr. [name]. Patient underwent PET scan 12/10/17 which showed a large left upper lobe nodule most consistent with bronchogenic carcinoma, left suprahilar nodal mass which obstructs the left upper lobe bronchus consistent with nodal metastasis, nodal metastasis in the mediastinum and distant skeletal metastasis of the proximal femur, sacrum, thoracic spine, lumbar spin, ribs and scapula. Request has been made to IR for biopsy of the left supraclavicular lymph node for further treatment guidance.  Patient presents with several family members today, she had radiation this morning which went well. She reports some nausea early this morning which was relieved with Zofran around 7 am. She is hungry and is looking forward to eating after the procedure. She denies any other complaints.   Past Medical History:  Diagnosis Date  . Adenomatous colon polyp   . Diabetes mellitus without complication ([place])   . Hemorrhoids   . Menopause   . OA (osteoarthritis)   . Obesity   . Other and unspecified hyperlipidemia   . Stroke [facility])     Past Surgical History:  Procedure Laterality Date  . HYSTERECTOMY ABDOMINAL WITH SALPINGO-OOPHORECTOMY  1990  . KNEE ARTHROSCOPY Left 2009  . SHOULDER SURGERY  2005    Allergies: Aspirin  Medications: Prior to Admission medications   Medication Sig Start Date End Date Taking? Authorizing Provider  benzonatate (TESSALON) 100 MG capsule Take 1 capsule (100 mg total) by mouth 3 (three) times daily as needed for cough. 11/28/17  Yes Brunetta Genera,  MD  dexamethasone (DECADRON) 4 MG tablet 1 tab with breakfast and lunch for 3-4 days then 1 tab with breakfast only 12/10/17  Yes Brunetta Genera, MD  diphenhydrAMINE (BENADRYL) 25 mg capsule Take 25 mg by mouth daily.   Yes [provider]  diphenhydramine-acetaminophen (TYLENOL PM) 25-500 MG TABS tablet Take 1 tablet by mouth at bedtime as needed (sleep).   Yes [provider]  fentaNYL (DURAGESIC - DOSED MCG/HR) 12 MCG/HR Place 1 patch (12.5 mcg total) onto the skin every 3 (three) days. 12/10/17  Yes Brunetta Genera, MD  hydrochlorothiazide (HYDRODIURIL) 25 MG tablet Take 25 mg by mouth daily. 10/09/16  Yes [provider]  HYDROmorphone (DILAUDID) 2 MG tablet Take 1-2 tablets (2-4 mg total) by mouth every 4 (four) hours as needed for severe pain. 12/10/17  Yes Brunetta Genera, MD  insulin lispro (HUMALOG) 100 UNIT/ML injection Inject 10 Units into the skin at bedtime.   Yes [provider]  JARDIANCE 25 MG TABS tablet Take 25 mg by mouth daily. 10/01/16  Yes [provider]  LORazepam (ATIVAN) 0.5 MG tablet Take 1-2 tablets (0.5-1 mg total) by mouth once as needed for up to 1 dose (30-89mns prior to MRI or CT for anxiety/claustrophobia). 12/23/17  Yes KBrunetta Genera MD  losartan (COZAAR) 25 MG tablet Take 25 mg by mouth daily.   Yes [provider]  metFORMIN (GLUCOPHAGE) 1000 MG tablet Take 1,000 mg by mouth 2 (two) times daily with a meal.   Yes [provider]  ondansetron (ZOFRAN ODT) 4 MG disintegrating tablet Take 1 tablet (4 mg total) by  mouth every 8 (eight) hours as needed for nausea or vomiting. 12/07/17  Yes Long, Wonda Olds, MD  pioglitazone (ACTOS) 45 MG tablet Take 45 mg by mouth at bedtime. 10/01/16  Yes [provider]  rosuvastatin (CRESTOR) 40 MG tablet Take 40 mg by mouth daily. 10/01/16  Yes [provider]  zolpidem (AMBIEN) 10 MG tablet Take 10 mg by mouth at bedtime.    Yes  [provider]  ASPERCREME LIDOCAINE EX Apply 1 application topically daily as needed (muscle aches).    [provider]  azithromycin (ZITHROMAX) 250 MG tablet Take 1 tablet daily Patient not taking: Reported on 11/28/2017 06/13/17   Isla Pence, MD  clopidogrel (PLAVIX) 75 MG tablet Take 75 mg by mouth daily. 11/23/16   [provider]     Family History  Problem Relation Age of Onset  . Heart attack Father   . Heart disease Brother        1/8  . Heart disease Brother        2/8  . Heart disease Brother        3/8  . Heart disease Brother        4/8  . Heart disease Brother        5/8  . Heart disease Brother        6/8  . Cancer Brother        7/8  . Seizures Brother        8/8  . Stroke Brother        8/8  . Heart disease Sister        1/6  . Alzheimer's disease Sister        2/6  . Diabetes Sister        3/6  . Hypertension Sister        3/6  . Hypertension Sister        4/6  . Diabetes Sister        4/6  . Diabetes Daughter     Social History   Socioeconomic History  . Marital status: Married    Spouse name: Not on file  . Number of children: 3  . Years of education: Not on file  . Highest education level: Not on file  Occupational History  . Occupation: works at [employer]  . Financial resource strain: Not on file  . Food insecurity:    Worry: Not on file    Inability: Not on file  . Transportation needs:    Medical: Not on file    Non-medical: Not on file  Tobacco Use  . Smoking status: Never Smoker  . Smokeless tobacco: Never Used  Substance and Sexual Activity  . Alcohol use: Yes    Comment: occasionally  . Drug use: No  . Sexual activity: Not on file  Lifestyle  . Physical activity:    Days per week: Not on file    Minutes per session: Not on file  . Stress: Not on file  Relationships  . Social connections:    Talks on phone: Not on file    Gets together: Not on file    Attends religious  service: Not on file    Active member of club or organization: Not on file    Attends meetings of clubs or organizations: Not on file    Relationship status: Not on file  Other Topics Concern  . Not on file  Social History Narrative  . Not on  file     Review of Systems: A 12 point ROS discussed and pertinent positives are indicated in the HPI above.  All other systems are negative.  Review of Systems  Constitutional: Negative for appetite change, chills, fatigue, fever and unexpected weight change.  Respiratory: Negative for cough and shortness of breath.   Cardiovascular: Negative for chest pain.  Gastrointestinal: Negative for abdominal pain, constipation, diarrhea, nausea and vomiting.  Genitourinary: Negative for dysuria and hematuria.  Neurological: Negative for dizziness, syncope and headaches.  Hematological: Positive for adenopathy.  Psychiatric/Behavioral: Positive for confusion (at times due to history of stroke).    Vital Signs: BP 131/87 (BP Location: Right Arm)   Pulse (!) 108   Temp 99.2 F (37.3 C) (Oral)   Resp 16   SpO2 96%   Physical Exam Vitals signs reviewed.  Constitutional:      General: She is not in acute distress. HENT:     Head: Normocephalic and atraumatic.  Neck:     Comments: (+) painless palpable left supraclavicular lymph node Cardiovascular:     Rate and Rhythm: Normal rate and regular rhythm.     Heart sounds: Murmur present.  Pulmonary:     Effort: Pulmonary effort is normal.     Breath sounds: Normal breath sounds.  Abdominal:     General: Bowel sounds are normal. There is no distension.     Palpations: Abdomen is soft.     Tenderness: There is no abdominal tenderness.  Skin:    General: Skin is warm and dry.  Neurological:     Mental Status: She is alert and oriented to person, place, and time.  Psychiatric:        Mood and Affect: Mood normal.        Behavior: Behavior normal.        Thought Content: Thought content normal.         Judgment: Judgment normal.      MD Evaluation Airway: WNL Heart: WNL Abdomen: WNL Chest/ Lungs: WNL ASA  Classification: 3 Mallampati/Airway Score: One   Imaging: Dg Chest 2 View  Result Date: 12/07/2017 CLINICAL DATA:  Unbearable right shoulder pain. EXAM: CHEST - 2 VIEW COMPARISON:  06/12/2017 FINDINGS: The cardiac silhouette is normal. Calcific atherosclerotic disease and tortuosity of the aorta. Spiculated left hilar/suprahilar mass. There is no evidence of scratch pleural effusion or pneumothorax. Peribronchial thickening in the left upper lobe. Volume loss in the left hemithorax. Question lytic lesions in the lower thoracic spine. Soft tissues are grossly normal. IMPRESSION: Spiculated left hilar/suprahilar mass with peribronchial thickening in the left upper lobe. The findings are suspicious for primary lung malignancy. Further evaluation with chest CT or PET-CT is recommended. These results were called by telephone at the time of interpretation on 12/07/2017 at 11:42 am to Dr. Nanda Quinton , who verbally acknowledged these results. Electronically Signed   By: Fidela Salisbury M.D.   On: 12/07/2017 11:44   Dg Shoulder Right  Result Date: 12/07/2017 CLINICAL DATA:  Right shoulder pain. Widespread marrow lesions in the lumbar spine on recent MRI. EXAM: RIGHT SHOULDER - 2+ VIEW COMPARISON:  None. FINDINGS: No fracture. No right acromioclavicular separation. No right glenohumeral dislocation. No suspicious focal osseous lesions. Moderate acromioclavicular joint and mild glenohumeral joint osteoarthritis. No pathologic soft tissue calcifications. IMPRESSION: 1. No right shoulder fracture or malalignment. No suspicious focal osseous lesions. 2. Moderate AC joint and mild glenohumeral joint osteoarthritis. Electronically Signed   By: Ilona Sorrel M.D.   On:  12/07/2017 11:40   Ct Chest W Contrast  Result Date: 12/07/2017 CLINICAL DATA:  Evaluate lung mass. EXAM: CT CHEST WITH  CONTRAST TECHNIQUE: Multidetector CT imaging of the chest was performed during intravenous contrast administration. CONTRAST:  62m OMNIPAQUE IOHEXOL 300 MG/ML  SOLN COMPARISON:  None. FINDINGS: Cardiovascular: Mild cardiac enlargement. Aortic atherosclerosis. Three vessel coronary artery atherosclerotic calcifications noted. Mediastinum/Nodes: Normal appearance of the thyroid gland. The trachea appears patent and is midline. Normal appearance of the esophagus. No axillary or supraclavicular adenopathy. No enlarged right paratracheal, subcarinal or right hilar adenopathy. AP window mass measures 3.8 x 4.2 x 2.4 cm, image 46/2. This completely encases and occludes the left pulmonary artery, image 50/2. There is encasement and occlusion of the left upper lobe bronchi, image 53/2. Partial encasement of the lingular bronchi and left lower lobe bronchi noted. Enlarged left hilar lymph node measures 1.5 cm, image 60/2. Lungs/Pleura: Mild thickening of the pleura in the left posterior costophrenic sulcus noted. Mild scattered areas of pleural nodularity are noted. No pleural fluid identified. Multiple nodules are identified within the left upper lobe. Dominant nodule is in the apex measuring 3.1 cm, image 22/5. Upper Abdomen: No acute abnormality. Musculoskeletal: Multifocal lytic bone metastases are identified. -Large lytic lesion involves the T11 vertebral body measuring 2 cm, image 76/7. -Lytic lesion involving the left pedicle of T2 measures 1.5 cm, image 7/5. -Lytic lesion involving the lamina of the C7 vertebra on the right is noted, image 1/5. IMPRESSION: 1. Left apical lung mass is identified worrisome for primary bronchogenic tumor. Associated left AP window nodal mass is identified which occludes the left pulmonary artery and left upper lobe bronchus. Enlarged left hilar lymph node also noted. Multiple pulmonary nodules are identified within the left upper lobe and there is diffuse pleural nodularity overlying  the left lung worrisome for pleural spread of disease. Multifocal bone metastases are identified including a large lytic lesion involving the T11 vertebra. 2.  Aortic Atherosclerosis (ICD10-I70.0). 3. Multi vessel coronary artery atherosclerotic calcifications. Electronically Signed   By: TKerby MoorsM.D.   On: 12/07/2017 13:04   Mr BJeri CosWOMContrast  Result Date: 12/26/2017 CLINICAL DATA:  72y/o  F; non-small cell lung cancer for staging. EXAM: MRI HEAD WITHOUT AND WITH CONTRAST TECHNIQUE: Multiplanar, multiecho pulse sequences of the brain and surrounding structures were obtained without and with intravenous contrast. CONTRAST:  6 cc Gadavist. COMPARISON:  03/12/2015 MRI head.  12/10/2017 PET-CT. FINDINGS: Brain: No acute infarction, hemorrhage, hydrocephalus, extra-axial collection or mass effect. Small chronic infarcts of left paramedian pontomedullary junction, left caudate body, right inferomedial cerebellar hemisphere, right lateral temporal lobe, and bifrontal periventricular white matter. Progression of nonspecific T2 FLAIR hyperintensities in subcortical and periventricular white matter are compatible with advanced chronic microvascular ischemic changes for age. Stable moderate volume loss of the brain. Round 5 mm focus of enhancement within the right anterior basal ganglia (series 12, image 36). Vascular: Normal flow voids. Skull and upper cervical spine: Few small T1 hyperintense hemangiomata in the calvarium. No enhancing lesion identified. Sinuses/Orbits: Negative. Other: None. IMPRESSION: 1. Single 5 mm focus of enhancement within right anterior basal ganglia, likely metastasis. 2. Progression of advanced chronic microvascular ischemic changes in stable moderate volume loss of the brain from 2017. Multiple stable chronic infarcts as above. Electronically Signed   By: LKristine GarbeM.D.   On: 12/26/2017 21:03   Nm Pet Image Initial (pi) Whole Body  Result Date:  12/10/2017 CLINICAL DATA:  Initial treatment strategy  for metastatic carcinoma versus myeloma. EXAM: NUCLEAR MEDICINE PET WHOLE BODY TECHNIQUE: 7.6 mCi F-18 FDG was injected intravenously. Full-ring PET imaging was performed from the skull base to thigh after the radiotracer. CT data was obtained and used for attenuation correction and anatomic localization. Fasting blood glucose: 136 mg/dl COMPARISON:  Lumbar MRI 11/15/2017, CT chest '11 30 19 ' FINDINGS: Mediastinal blood pool activity: SUV max 2.1 HEAD/NECK: Hypermetabolic LEFT posterior triangle (level 5) lymph node with SUV max equal 7.1. Lymph node measures 12 mm on image 63/4 Incidental CT findings: none CHEST: Hypermetabolic LEFT upper lobe nodule measures 2.5 cm with SUV max equal 19.1. Peripheral nodularity in the RIGHT upper lobe with minimal metabolic activity likely represents postobstructive pneumonitis. Hypermetabolic LEFT suprahilar mass which extends into the AP window and surrounds the LEFT upper lobe bronchus. LEFT suprahilar mass measures 4.6 cm SUV max equal 23.6 There are hypermetabolic subcarinal and prevascular lymph nodes. Incidental CT findings: Small LEFT effusion. ABDOMEN/PELVIS: No abnormal hypermetabolic activity within the liver, pancreas, adrenal glands, or spleen. No hypermetabolic lymph nodes in the abdomen or pelvis. Incidental CT findings: Post hysterectomy SKELETON: Multiple sites of hypermetabolic activity in the axillary and appendicular skeleton consistent widespread skeletal metastasis. Many of these lesions are lytic. For example lesion in the posterior RIGHT sacral ala with SUV max equal 16.9. Lytic lesion in the mid RIGHT iliac bone with SUV max equal 12.1 measures 18 mm on image 161/4. LEFT inferior trochanter femur lesion with SUV max equal 12.7. Multiple lesions in the spine which also lytic including L5, L3, L2, and T 10. Multiple rib lesions involved additionally Intense lesion in the RIGHT transverse process of the T3  vertebral body with SUV max equal 12.3. Lesion in the LEFT scapula intensely hypermetabolic SUV max equal 9.4 Incidental CT findings: none EXTREMITIES: Proximal LEFT femur hypermetabolic lesion. Incidental CT findings: LEFT IMPRESSION: 1. Large LEFT upper lobe nodule most consistent with bronchogenic carcinoma. 2. LEFT suprahilar nodal mass which obstructs the LEFT upper lobe bronchus and extends into the AP window consistent with nodal metastasis. 3. Additional nodal metastasis in the mediastinum. 4. Distant skeletal metastasis involving the proximal femur, sacrum, multiple lesions in the thoracic and lumbar spine, ribs, and scapula. Favor skeletal metastasis from bronchogenic carcinoma over myeloma. Electronically Signed   By: Suzy Bouchard M.D.   On: 12/10/2017 12:16   Ct Biopsy  Result Date: 12/04/2017 CLINICAL DATA:  Bone lesions by lumbar spine MRI. EXAM: CT GUIDED BONE MARROW ASPIRATION AND BIOPSY ANESTHESIA/SEDATION: Versed 2.0 mg IV, Fentanyl 100 mcg IV Total Moderate Sedation Time:   13 minutes. The patient's level of consciousness and physiologic status were continuously monitored during the procedure by Radiology nursing. PROCEDURE: The procedure risks, benefits, and alternatives were explained to the patient. Questions regarding the procedure were encouraged and answered. The patient understands and consents to the procedure. A time out was performed prior to initiating the procedure. The right gluteal region was prepped with chlorhexidine. Sterile gown and sterile gloves were used for the procedure. Local anesthesia was provided with 1% Lidocaine. Under CT guidance, an 11 gauge On Control bone cutting needle was advanced from a posterior approach into the right iliac bone. Needle positioning was confirmed with CT. Initial non heparinized and heparinized aspirate samples were obtained of bone marrow. Core biopsy was performed via the On Control drill needle. COMPLICATIONS: None FINDINGS:  Inspection of initial aspirate did reveal visible particles. Intact core biopsy sample was obtained. IMPRESSION: CT guided bone marrow biopsy of right  posterior iliac bone with both aspirate and core samples obtained. Electronically Signed   By: Aletta Edouard M.D.   On: 12/04/2017 12:52   Dg Shoulder Left  Result Date: 12/07/2017 CLINICAL DATA:  Left shoulder pain. Widespread marrow lesions throughout the lumbar spine on recent MRI. EXAM: LEFT SHOULDER - 2+ VIEW COMPARISON:  None. FINDINGS: No fracture. No left acromioclavicular separation. No left glenohumeral dislocation. No suspicious focal osseous lesions. Moderate left acromioclavicular and left glenohumeral osteoarthritis. No pathologic soft tissue calcifications. IMPRESSION: 1. No left shoulder fracture or malalignment. No suspicious focal osseous lesions. 2. Moderate left AC joint and left glenohumeral joint osteoarthritis. Electronically Signed   By: Ilona Sorrel M.D.   On: 12/07/2017 11:39   Ct Bone Marrow Biopsy & Aspiration  Result Date: 12/04/2017 CLINICAL DATA:  Bone lesions by lumbar spine MRI. EXAM: CT GUIDED BONE MARROW ASPIRATION AND BIOPSY ANESTHESIA/SEDATION: Versed 2.0 mg IV, Fentanyl 100 mcg IV Total Moderate Sedation Time:   13 minutes. The patient's level of consciousness and physiologic status were continuously monitored during the procedure by Radiology nursing. PROCEDURE: The procedure risks, benefits, and alternatives were explained to the patient. Questions regarding the procedure were encouraged and answered. The patient understands and consents to the procedure. A time out was performed prior to initiating the procedure. The right gluteal region was prepped with chlorhexidine. Sterile gown and sterile gloves were used for the procedure. Local anesthesia was provided with 1% Lidocaine. Under CT guidance, an 11 gauge On Control bone cutting needle was advanced from a posterior approach into the right iliac bone. Needle  positioning was confirmed with CT. Initial non heparinized and heparinized aspirate samples were obtained of bone marrow. Core biopsy was performed via the On Control drill needle. COMPLICATIONS: None FINDINGS: Inspection of initial aspirate did reveal visible particles. Intact core biopsy sample was obtained. IMPRESSION: CT guided bone marrow biopsy of right posterior iliac bone with both aspirate and core samples obtained. Electronically Signed   By: Aletta Edouard M.D.   On: 12/04/2017 12:52    Labs:  CBC: Recent Labs    12/04/17 0724 12/07/17 0958 12/19/17 1442 12/31/17 1201  WBC 8.8 10.7* 16.7* 11.5*  HGB 11.1* 12.1 11.4* 13.0  HCT 36.2 38.6 35.2* 39.1  PLT 318 354 381 158    COAGS: Recent Labs    12/04/17 0724 12/31/17 1201  INR 1.02 1.05    BMP: Recent Labs    06/12/17 2109 11/28/17 1506 12/07/17 0958 12/19/17 1442  NA 142 141 140 138  K 3.7 3.8 3.7 4.0  CL 106 103 102 101  CO2 '26 26 27 26  ' GLUCOSE 94 94 170* 207*  BUN '18 11 11 18  ' CALCIUM 9.6 10.1 9.6 9.9  CREATININE 1.06* 0.95 0.80 1.30*  GFRNONAA 51* 58* >60 41*  GFRAA 59* >60 >60 47*    LIVER FUNCTION TESTS: Recent Labs    06/12/17 2109 11/28/17 1506 12/19/17 1442  BILITOT 0.8 0.6 0.3  AST 16 15 11*  ALT 9* <6 8  ALKPHOS 69 138* 226*  PROT 7.7 8.0 7.1  ALBUMIN 4.3 3.8 3.3*    TUMOR MARKERS: No results for input(s): AFPTM, CEA, CA199, CHROMGRNA in the last 8760 hours.  Assessment and Plan:  Patient with metastatic cancer with unknown primary although likely bronchogenic who is followed by Dr. [name]. PET scan from 12/10/17 shows large left upper lobe nodule most consistent with bronchogenic carcinoma, left suprahilar nodal mass which obstructs the left upper lobe bronchus consistent with  nodal metastasis, nodal metastasis in the mediastinum and distant skeletal metastasis of the proximal femur, sacrum, thoracic spine, lumbar spin, ribs and scapula. Request made to IR for biopsy of left  supraclavicular lymph node.  Patient has been NPO since last evening, she does take Plavix with last dose >1 week ago per patient and daughter, she took Zofran at 7 am this morning with a few sips of water. Afebrile, WBC 11.5, hgb 13.0, plt 158, INR 1.05.  Risks and benefits discussed with the patient including, but not limited to bleeding, infection, damage to adjacent structures or low yield requiring additional tests.  All of the patient's questions were answered, patient is agreeable to proceed.  Consent signed and in chart.  Thank you for this interesting consult.  I greatly enjoyed meeting Elizabeth Calhoun and look forward to participating in their care.  A copy of this report was sent to the requesting provider on this date.  Electronically Signed: Joaquim Nam, PA-C 12/31/2017, 1:09 PM   I spent a total of  30 Minutes  in face to face in clinical consultation, greater than 50% of which was counseling/coordinating care for supraclavicular lymph node biopsy.

## 2018-01-02 ENCOUNTER — Ambulatory Visit
Admission: RE | Admit: 2018-01-02 | Discharge: 2018-01-02 | Disposition: A | Discharge status: Home / Self Care | Payer: Medicare Other | Source: Ambulatory Visit | Attending: Radiation Oncology | Admitting: Radiation Oncology

## 2018-01-02 ENCOUNTER — Inpatient Hospital Stay (HOSPITAL_BASED_OUTPATIENT_CLINIC_OR_DEPARTMENT_OTHER): Payer: Medicare Other | Admitting: Hematology

## 2018-01-02 ENCOUNTER — Telehealth: Payer: Self-pay | Admitting: Hematology

## 2018-01-02 VITALS — BP 114/76 | HR 106 | Temp 98.3°F | Resp 18 | Ht 60.0 in | Wt 144.7 lb

## 2018-01-02 DIAGNOSIS — C781 Secondary malignant neoplasm of mediastinum: Secondary | ICD-10-CM

## 2018-01-02 DIAGNOSIS — R110 Nausea: Secondary | ICD-10-CM

## 2018-01-02 DIAGNOSIS — R918 Other nonspecific abnormal finding of lung field: Secondary | ICD-10-CM | POA: Diagnosis not present

## 2018-01-02 DIAGNOSIS — C3412 Malignant neoplasm of upper lobe, left bronchus or lung: Secondary | ICD-10-CM

## 2018-01-02 DIAGNOSIS — R05 Cough: Secondary | ICD-10-CM

## 2018-01-02 DIAGNOSIS — M129 Arthropathy, unspecified: Secondary | ICD-10-CM | POA: Diagnosis not present

## 2018-01-02 DIAGNOSIS — Z794 Long term (current) use of insulin: Secondary | ICD-10-CM | POA: Diagnosis not present

## 2018-01-02 DIAGNOSIS — G893 Neoplasm related pain (acute) (chronic): Secondary | ICD-10-CM

## 2018-01-02 DIAGNOSIS — Z800 Family history of malignant neoplasm of digestive organs: Secondary | ICD-10-CM

## 2018-01-02 DIAGNOSIS — C3490 Malignant neoplasm of unspecified part of unspecified bronchus or lung: Secondary | ICD-10-CM

## 2018-01-02 DIAGNOSIS — C7951 Secondary malignant neoplasm of bone: Secondary | ICD-10-CM

## 2018-01-02 DIAGNOSIS — I10 Essential (primary) hypertension: Secondary | ICD-10-CM | POA: Diagnosis not present

## 2018-01-02 DIAGNOSIS — R634 Abnormal weight loss: Secondary | ICD-10-CM | POA: Diagnosis not present

## 2018-01-02 DIAGNOSIS — E119 Type 2 diabetes mellitus without complications: Secondary | ICD-10-CM | POA: Diagnosis not present

## 2018-01-02 DIAGNOSIS — Z510 Encounter for antineoplastic radiation therapy: Secondary | ICD-10-CM | POA: Diagnosis not present

## 2018-01-02 DIAGNOSIS — M899 Disorder of bone, unspecified: Secondary | ICD-10-CM | POA: Diagnosis not present

## 2018-01-02 DIAGNOSIS — M545 Low back pain: Secondary | ICD-10-CM | POA: Diagnosis not present

## 2018-01-02 DIAGNOSIS — Z79899 Other long term (current) drug therapy: Secondary | ICD-10-CM

## 2018-01-02 DIAGNOSIS — R062 Wheezing: Secondary | ICD-10-CM | POA: Diagnosis not present

## 2018-01-02 DIAGNOSIS — Z7984 Long term (current) use of oral hypoglycemic drugs: Secondary | ICD-10-CM

## 2018-01-02 DIAGNOSIS — D649 Anemia, unspecified: Secondary | ICD-10-CM | POA: Diagnosis not present

## 2018-01-02 DIAGNOSIS — Z7902 Long term (current) use of antithrombotics/antiplatelets: Secondary | ICD-10-CM

## 2018-01-02 NOTE — Progress Notes (Signed)
HEMATOLOGY/ONCOLOGY CLINIC NOTE  Date of Service: 01/02/2018  Patient Care Team: Seward Carol, MD as PCP - General (Internal Medicine)  CHIEF COMPLAINTS/PURPOSE OF CONSULTATION:  Metastatic Disease with likely lung primary   HISTORY OF PRESENTING ILLNESS:   Elizabeth Calhoun is a wonderful 72 y.o. female who has been referred to [place] by Dr. Seward Carol  for evaluation and management of metastatic lesions. She is accompanied today by her two daughters and her niece. The pt reports that she is doing well overall.   The pt notes that two years ago she developed lower back pain which has presented intermittently until 6 months ago when it presented unceasingly. She notes lower back pain and left lower extremity pain, however the left leg pain is not constant but does endorse positional exacerbation. She also describes a sensation of pins and needles all over her back that presented episodically twice, one week apart. The pt has used a back brace occasionally which has helped reduce her pain.   The pt also developed a cough about 6 months ago and was switched off her BP medication (ACEI). She notes that her cough catches her off guard. She received Z pack and Toradol as well. She notes that she coughed up streaks of bright red blood two weeks ago, on one occasion. She denies any headaches, changes in speech, or changes in vision. The pt notes that she has had some abdominal pain when bending over, and has occasional diarrhea which she attributes to Metformin. She denies any blood in the stools or black stools. She notes that her last colonoscopy was 6-7 years ago and revealed only polyps.   She denies any vaginal bleeding or discharge. The pt had a partial hysterectomy in 1990 for fibroids. Her last mammogram was negative and was completed on 07/04/17.   The pt notes that she has had some loss of appetite and has lost 20 pounds, possibly more, in the last weeks to couple months. She notes that  she feels cold more often than she used to.   The pt has no current limitations, noting that she lives at home independently and is able to keep up with her chores and all of her daily activities.   She notes that she had a stroke in 2011, which occurred in her sleep. She had weakness in her right upper extremity at the time, which improved and normalized in the subsequent years after PT. She denies any remaining limitations. She continues on Plavix.   Of note prior to the patient's visit today, pt has had MRI Lumbar completed on 11/15/17 with results revealing Widespread marrow lesions consistent with metastatic disease. 2. Pathologic endplate fractures at L2. 3. Concerning left leg radiculopathy, there is far-lateral tumor infiltration at L2 which could affect extraforaminal nerve roots. No epidural tumor extension.  Most recent lab results (06/12/17) of CBC w/diff and CMP is as follows: all values are WNL except for HGB at 11.6, Creatinine at 1.06, ALT at 9, GFR at 51.  On review of systems, pt reports weaker appetite, cough, one episode of coughing blood, positional abdominal pain, left leg pain, lower back pain, 20 pounds weight loss, and denies vaginal bleeding, abnormal vaginal discharge, blood in the stools, black stools, headaches, and any other symptoms.   On PMHx the pt reports well controlled HTN, arthritis, hyperlipidemia, stroke in 2011, partial hysterectomy in 1990, 2005 left shoulder surgery, arthroscopic right knee surgery in 1996. On Social Hx the pt denies ever smoking, and  hasn't had alcohol in 15 years, previously social ETOH consumption  On Family Hx the pt reports CAD, mother with colon cancer above 22 y/o, brother with stomach cancer, and denies other cancer.  Interval History:   Elizabeth Calhoun returns today for management and evaluation of her metastatic cancer of unknown primary, concerning for lung cancer and review recent biopsy and MRI. The patient's last visit with [place]  was on 12/19/17. She is accompanied today by 3 members of her family. The pt reports that she is doing well overall.   She has started palliative RT to back, lung and hip for about 2 weeks now with Dr. [name]. She notes she has not been eating well due to more persistent nausea which also keeps her in the bed. She noted wanting to eat but was not able to taste her food. She felt very emotional about this. She takes 1000 units of metformin daily. She is also on [place].  She takes Dilaudid every 6 hours currently along with fentanyl patch. She notes she has been sleeping well as she has been staying in bed. She gets about 6 hours a night. She has not been active lately. She notes she has Ambien.   On review of symptoms, pt notes persistent nausea, low energy/activity, and taste change. She notes itching from Dilaudid.    MEDICAL HISTORY:  Past Medical History:  Diagnosis Date  . Adenomatous colon polyp   . Diabetes mellitus without complication ([place])   . Hemorrhoids   . Menopause   . OA (osteoarthritis)   . Obesity   . Other and unspecified hyperlipidemia   . Stroke [facility])     SURGICAL HISTORY: Past Surgical History:  Procedure Laterality Date  . HYSTERECTOMY ABDOMINAL WITH SALPINGO-OOPHORECTOMY  1990  . KNEE ARTHROSCOPY Left 2009  . SHOULDER SURGERY  2005    SOCIAL HISTORY: Social History   Socioeconomic History  . Marital status: Married    Spouse name: Not on file  . Number of children: 3  . Years of education: Not on file  . Highest education level: Not on file  Occupational History  . Occupation: works at [employer]  . Financial resource strain: Not on file  . Food insecurity:    Worry: Not on file    Inability: Not on file  . Transportation needs:    Medical: Not on file    Non-medical: Not on file  Tobacco Use  . Smoking status: Never Smoker  . Smokeless tobacco: Never Used  Substance and Sexual Activity  . Alcohol use: Yes    Comment:  occasionally  . Drug use: No  . Sexual activity: Not on file  Lifestyle  . Physical activity:    Days per week: Not on file    Minutes per session: Not on file  . Stress: Not on file  Relationships  . Social connections:    Talks on phone: Not on file    Gets together: Not on file    Attends religious service: Not on file    Active member of club or organization: Not on file    Attends meetings of clubs or organizations: Not on file    Relationship status: Not on file  . Intimate partner violence:    Fear of current or ex partner: Not on file    Emotionally abused: Not on file    Physically abused: Not on file    Forced sexual activity: Not on file  Other Topics  Concern  . Not on file  Social History Narrative  . Not on file    FAMILY HISTORY: Family History  Problem Relation Age of Onset  . Heart attack Father   . Heart disease Brother        1/8  . Heart disease Brother        2/8  . Heart disease Brother        3/8  . Heart disease Brother        4/8  . Heart disease Brother        5/8  . Heart disease Brother        6/8  . Cancer Brother        7/8  . Seizures Brother        8/8  . Stroke Brother        8/8  . Heart disease Sister        1/6  . Alzheimer's disease Sister        2/6  . Diabetes Sister        3/6  . Hypertension Sister        3/6  . Hypertension Sister        4/6  . Diabetes Sister        4/6  . Diabetes Daughter     ALLERGIES:  is allergic to aspirin.  MEDICATIONS:  Current Outpatient Medications  Medication Sig Dispense Refill  . ASPERCREME LIDOCAINE EX Apply 1 application topically daily as needed (muscle aches).    Marland Kitchen azithromycin (ZITHROMAX) 250 MG tablet Take 1 tablet daily (Patient not taking: Reported on 11/28/2017) 4 each 0  . benzonatate (TESSALON) 100 MG capsule Take 1 capsule (100 mg total) by mouth 3 (three) times daily as needed for cough. 50 capsule 1  . clopidogrel (PLAVIX) 75 MG tablet Take 75 mg by mouth  daily.  0  . dexamethasone (DECADRON) 4 MG tablet 1 tab with breakfast and lunch for 3-4 days then 1 tab with breakfast only 30 tablet 0  . diphenhydrAMINE (BENADRYL) 25 mg capsule Take 25 mg by mouth daily.    . diphenhydramine-acetaminophen (TYLENOL PM) 25-500 MG TABS tablet Take 1 tablet by mouth at bedtime as needed (sleep).    . fentaNYL (DURAGESIC - DOSED MCG/HR) 12 MCG/HR Place 1 patch (12.5 mcg total) onto the skin every 3 (three) days. 10 patch 0  . hydrochlorothiazide (HYDRODIURIL) 25 MG tablet Take 25 mg by mouth daily.  0  . HYDROmorphone (DILAUDID) 2 MG tablet Take 1-2 tablets (2-4 mg total) by mouth every 4 (four) hours as needed for severe pain. 60 tablet 0  . insulin lispro (HUMALOG) 100 UNIT/ML injection Inject 10 Units into the skin at bedtime.    Marland Kitchen JARDIANCE 25 MG TABS tablet Take 25 mg by mouth daily.  0  . LORazepam (ATIVAN) 0.5 MG tablet Take 1-2 tablets (0.5-1 mg total) by mouth once as needed for up to 1 dose (30-69mns prior to MRI or CT for anxiety/claustrophobia). 4 tablet 0  . losartan (COZAAR) 25 MG tablet Take 25 mg by mouth daily.    . metFORMIN (GLUCOPHAGE) 1000 MG tablet Take 1,000 mg by mouth 2 (two) times daily with a meal.    . ondansetron (ZOFRAN ODT) 4 MG disintegrating tablet Take 1 tablet (4 mg total) by mouth every 8 (eight) hours as needed for nausea or vomiting. 20 tablet 0  . pioglitazone (ACTOS) 45 MG tablet Take 45 mg by mouth at bedtime.  0  . rosuvastatin (CRESTOR) 40 MG tablet Take 40 mg by mouth daily.  0  . zolpidem (AMBIEN) 10 MG tablet Take 10 mg by mouth at bedtime.      No current facility-administered medications for this visit.     REVIEW OF SYSTEMS:    A 10+ POINT REVIEW OF SYSTEMS WAS OBTAINED including neurology, dermatology, psychiatry, cardiac, respiratory, lymph, extremities, GI, GU, Musculoskeletal, constitutional, breasts, reproductive, HEENT.  All pertinent positives are noted in the HPI.  All others are negative.   PHYSICAL  EXAMINATION: ECOG PERFORMANCE STATUS: 2 - Symptomatic, <50% confined to bed  . Vitals:   01/02/18 1500  BP: 114/76  Pulse: (!) 106  Resp: 18  Temp: 98.3 F (36.8 C)  SpO2: 99%   Filed Weights   01/02/18 1500  Weight: 144 lb 11.2 oz (65.6 kg)   .Body mass index is 28.26 kg/m.  GENERAL:alert, in no acute distress and comfortable SKIN: no acute rashes, no significant lesions EYES: conjunctiva are pink and non-injected, sclera anicteric OROPHARYNX: MMM, no exudates, no oropharyngeal erythema or ulceration NECK: supple, no JVD LYMPH:  no palpable lymphadenopathy in the cervical, axillary or inguinal regions LUNGS: clear to auscultation b/l with normal respiratory effort HEART: regular rate & rhythm ABDOMEN:  normoactive bowel sounds , non tender, not distended. No palpable hepatosplenomegaly.  Extremity: no pedal edema PSYCH: alert & oriented x 3 with fluent speech NEURO: no focal motor/sensory deficits   LABORATORY DATA:  I have reviewed the data as listed  . CBC Latest Ref Rng & Units 12/31/2017 12/19/2017 12/07/2017  WBC 4.0 - 10.5 K/uL 11.5(H) 16.7(H) 10.7(H)  Hemoglobin 12.0 - 15.0 g/dL 13.0 11.4(L) 12.1  Hematocrit 36.0 - 46.0 % 39.1 35.2(L) 38.6  Platelets 150 - 400 K/uL 158 381 354    . CMP Latest Ref Rng & Units 12/19/2017 12/07/2017 11/28/2017  Glucose 70 - 99 mg/dL 207(H) 170(H) 94  BUN 8 - 23 mg/dL _0 Creatinine 0.44 - 1.00 mg/dL 1.30(H) 0.80 0.95  Sodium 135 - 145 mmol/L 138 140 141  Potassium 3.5 - 5.1 mmol/L 4.0 3.7 3.8  Chloride 98 - 111 mmol/L 101 102 103  CO2 22 - 32 mmol/L _1 Calcium 8.9 - 10.3 mg/dL 9.9 9.6 10.1  Total Protein 6.5 - 8.1 g/dL 7.1 - 8.0  Total Bilirubin 0.3 - 1.2 mg/dL 0.3 - 0.6  Alkaline Phos 38 - 126 U/L 226(H) - 138(H)  AST 15 - 41 U/L 11(L) - 15  ALT 0 - 44 U/L 8 - <6   . Lab Results  Component Value Date   LDH 389 (H) 11/28/2017    12/04/17 BM Bx:    RADIOGRAPHIC STUDIES: I have personally reviewed  the radiological images as listed and agreed with the findings in the report. Dg Chest 2 View  Result Date: 12/07/2017 CLINICAL DATA:  Unbearable right shoulder pain. EXAM: CHEST - 2 VIEW COMPARISON:  06/12/2017 FINDINGS: The cardiac silhouette is normal. Calcific atherosclerotic disease and tortuosity of the aorta. Spiculated left hilar/suprahilar mass. There is no evidence of scratch pleural effusion or pneumothorax. Peribronchial thickening in the left upper lobe. Volume loss in the left hemithorax. Question lytic lesions in the lower thoracic spine. Soft tissues are grossly normal. IMPRESSION: Spiculated left hilar/suprahilar mass with peribronchial thickening in the left upper lobe. The findings are suspicious for primary lung malignancy. Further evaluation with chest CT or PET-CT is recommended. These results were called by telephone at the time  of interpretation on 12/07/2017 at 11:42 am to Dr. Nanda Quinton , who verbally acknowledged these results. Electronically Signed   By: Fidela Salisbury M.D.   On: 12/07/2017 11:44   Dg Shoulder Right  Result Date: 12/07/2017 CLINICAL DATA:  Right shoulder pain. Widespread marrow lesions in the lumbar spine on recent MRI. EXAM: RIGHT SHOULDER - 2+ VIEW COMPARISON:  None. FINDINGS: No fracture. No right acromioclavicular separation. No right glenohumeral dislocation. No suspicious focal osseous lesions. Moderate acromioclavicular joint and mild glenohumeral joint osteoarthritis. No pathologic soft tissue calcifications. IMPRESSION: 1. No right shoulder fracture or malalignment. No suspicious focal osseous lesions. 2. Moderate AC joint and mild glenohumeral joint osteoarthritis. Electronically Signed   By: Ilona Sorrel M.D.   On: 12/07/2017 11:40   Ct Chest W Contrast  Result Date: 12/07/2017 CLINICAL DATA:  Evaluate lung mass. EXAM: CT CHEST WITH CONTRAST TECHNIQUE: Multidetector CT imaging of the chest was performed during intravenous contrast  administration. CONTRAST:  65m OMNIPAQUE IOHEXOL 300 MG/ML  SOLN COMPARISON:  None. FINDINGS: Cardiovascular: Mild cardiac enlargement. Aortic atherosclerosis. Three vessel coronary artery atherosclerotic calcifications noted. Mediastinum/Nodes: Normal appearance of the thyroid gland. The trachea appears patent and is midline. Normal appearance of the esophagus. No axillary or supraclavicular adenopathy. No enlarged right paratracheal, subcarinal or right hilar adenopathy. AP window mass measures 3.8 x 4.2 x 2.4 cm, image 46/2. This completely encases and occludes the left pulmonary artery, image 50/2. There is encasement and occlusion of the left upper lobe bronchi, image 53/2. Partial encasement of the lingular bronchi and left lower lobe bronchi noted. Enlarged left hilar lymph node measures 1.5 cm, image 60/2. Lungs/Pleura: Mild thickening of the pleura in the left posterior costophrenic sulcus noted. Mild scattered areas of pleural nodularity are noted. No pleural fluid identified. Multiple nodules are identified within the left upper lobe. Dominant nodule is in the apex measuring 3.1 cm, image 22/5. Upper Abdomen: No acute abnormality. Musculoskeletal: Multifocal lytic bone metastases are identified. -Large lytic lesion involves the T11 vertebral body measuring 2 cm, image 76/7. -Lytic lesion involving the left pedicle of T2 measures 1.5 cm, image 7/5. -Lytic lesion involving the lamina of the C7 vertebra on the right is noted, image 1/5. IMPRESSION: 1. Left apical lung mass is identified worrisome for primary bronchogenic tumor. Associated left AP window nodal mass is identified which occludes the left pulmonary artery and left upper lobe bronchus. Enlarged left hilar lymph node also noted. Multiple pulmonary nodules are identified within the left upper lobe and there is diffuse pleural nodularity overlying the left lung worrisome for pleural spread of disease. Multifocal bone metastases are identified  including a large lytic lesion involving the T11 vertebra. 2.  Aortic Atherosclerosis (ICD10-I70.0). 3. Multi vessel coronary artery atherosclerotic calcifications. Electronically Signed   By: TKerby MoorsM.D.   On: 12/07/2017 13:04   Mr BJeri CosWTDContrast  Result Date: 12/26/2017 CLINICAL DATA:  72y/o  F; non-small cell lung cancer for staging. EXAM: MRI HEAD WITHOUT AND WITH CONTRAST TECHNIQUE: Multiplanar, multiecho pulse sequences of the brain and surrounding structures were obtained without and with intravenous contrast. CONTRAST:  6 cc Gadavist. COMPARISON:  03/12/2015 MRI head.  12/10/2017 PET-CT. FINDINGS: Brain: No acute infarction, hemorrhage, hydrocephalus, extra-axial collection or mass effect. Small chronic infarcts of left paramedian pontomedullary junction, left caudate body, right inferomedial cerebellar hemisphere, right lateral temporal lobe, and bifrontal periventricular white matter. Progression of nonspecific T2 FLAIR hyperintensities in subcortical and periventricular white matter are compatible with  advanced chronic microvascular ischemic changes for age. Stable moderate volume loss of the brain. Round 5 mm focus of enhancement within the right anterior basal ganglia (series 12, image 36). Vascular: Normal flow voids. Skull and upper cervical spine: Few small T1 hyperintense hemangiomata in the calvarium. No enhancing lesion identified. Sinuses/Orbits: Negative. Other: None. IMPRESSION: 1. Single 5 mm focus of enhancement within right anterior basal ganglia, likely metastasis. 2. Progression of advanced chronic microvascular ischemic changes in stable moderate volume loss of the brain from 2017. Multiple stable chronic infarcts as above. Electronically Signed   By: Kristine Garbe M.D.   On: 12/26/2017 21:03   Nm Pet Image Initial (pi) Whole Body  Result Date: 12/10/2017 CLINICAL DATA:  Initial treatment strategy for metastatic carcinoma versus myeloma. EXAM: NUCLEAR  MEDICINE PET WHOLE BODY TECHNIQUE: 7.6 mCi F-18 FDG was injected intravenously. Full-ring PET imaging was performed from the skull base to thigh after the radiotracer. CT data was obtained and used for attenuation correction and anatomic localization. Fasting blood glucose: 136 mg/dl COMPARISON:  Lumbar MRI 11/15/2017, CT chest _0 FINDINGS: Mediastinal blood pool activity: SUV max 2.1 HEAD/NECK: Hypermetabolic LEFT posterior triangle (level 5) lymph node with SUV max equal 7.1. Lymph node measures 12 mm on image 63/4 Incidental CT findings: none CHEST: Hypermetabolic LEFT upper lobe nodule measures 2.5 cm with SUV max equal 19.1. Peripheral nodularity in the RIGHT upper lobe with minimal metabolic activity likely represents postobstructive pneumonitis. Hypermetabolic LEFT suprahilar mass which extends into the AP window and surrounds the LEFT upper lobe bronchus. LEFT suprahilar mass measures 4.6 cm SUV max equal 23.6 There are hypermetabolic subcarinal and prevascular lymph nodes. Incidental CT findings: Small LEFT effusion. ABDOMEN/PELVIS: No abnormal hypermetabolic activity within the liver, pancreas, adrenal glands, or spleen. No hypermetabolic lymph nodes in the abdomen or pelvis. Incidental CT findings: Post hysterectomy SKELETON: Multiple sites of hypermetabolic activity in the axillary and appendicular skeleton consistent widespread skeletal metastasis. Many of these lesions are lytic. For example lesion in the posterior RIGHT sacral ala with SUV max equal 16.9. Lytic lesion in the mid RIGHT iliac bone with SUV max equal 12.1 measures 18 mm on image 161/4. LEFT inferior trochanter femur lesion with SUV max equal 12.7. Multiple lesions in the spine which also lytic including L5, L3, L2, and T 10. Multiple rib lesions involved additionally Intense lesion in the RIGHT transverse process of the T3 vertebral body with SUV max equal 12.3. Lesion in the LEFT scapula intensely hypermetabolic SUV max equal 9.4  Incidental CT findings: none EXTREMITIES: Proximal LEFT femur hypermetabolic lesion. Incidental CT findings: LEFT IMPRESSION: 1. Large LEFT upper lobe nodule most consistent with bronchogenic carcinoma. 2. LEFT suprahilar nodal mass which obstructs the LEFT upper lobe bronchus and extends into the AP window consistent with nodal metastasis. 3. Additional nodal metastasis in the mediastinum. 4. Distant skeletal metastasis involving the proximal femur, sacrum, multiple lesions in the thoracic and lumbar spine, ribs, and scapula. Favor skeletal metastasis from bronchogenic carcinoma over myeloma. Electronically Signed   By: Suzy Bouchard M.D.   On: 12/10/2017 12:16   Ct Biopsy  Result Date: 12/04/2017 CLINICAL DATA:  Bone lesions by lumbar spine MRI. EXAM: CT GUIDED BONE MARROW ASPIRATION AND BIOPSY ANESTHESIA/SEDATION: Versed 2.0 mg IV, Fentanyl 100 mcg IV Total Moderate Sedation Time:   13 minutes. The patient's level of consciousness and physiologic status were continuously monitored during the procedure by Radiology nursing. PROCEDURE: The procedure risks, benefits, and alternatives were explained to the  patient. Questions regarding the procedure were encouraged and answered. The patient understands and consents to the procedure. A time out was performed prior to initiating the procedure. The right gluteal region was prepped with chlorhexidine. Sterile gown and sterile gloves were used for the procedure. Local anesthesia was provided with 1% Lidocaine. Under CT guidance, an 11 gauge On Control bone cutting needle was advanced from a posterior approach into the right iliac bone. Needle positioning was confirmed with CT. Initial non heparinized and heparinized aspirate samples were obtained of bone marrow. Core biopsy was performed via the On Control drill needle. COMPLICATIONS: None FINDINGS: Inspection of initial aspirate did reveal visible particles. Intact core biopsy sample was obtained. IMPRESSION: CT  guided bone marrow biopsy of right posterior iliac bone with both aspirate and core samples obtained. Electronically Signed   By: Aletta Edouard M.D.   On: 12/04/2017 12:52   Dg Shoulder Left  Result Date: 12/07/2017 CLINICAL DATA:  Left shoulder pain. Widespread marrow lesions throughout the lumbar spine on recent MRI. EXAM: LEFT SHOULDER - 2+ VIEW COMPARISON:  None. FINDINGS: No fracture. No left acromioclavicular separation. No left glenohumeral dislocation. No suspicious focal osseous lesions. Moderate left acromioclavicular and left glenohumeral osteoarthritis. No pathologic soft tissue calcifications. IMPRESSION: 1. No left shoulder fracture or malalignment. No suspicious focal osseous lesions. 2. Moderate left AC joint and left glenohumeral joint osteoarthritis. Electronically Signed   By: Ilona Sorrel M.D.   On: 12/07/2017 11:39   Ct Bone Marrow Biopsy & Aspiration  Result Date: 12/04/2017 CLINICAL DATA:  Bone lesions by lumbar spine MRI. EXAM: CT GUIDED BONE MARROW ASPIRATION AND BIOPSY ANESTHESIA/SEDATION: Versed 2.0 mg IV, Fentanyl 100 mcg IV Total Moderate Sedation Time:   13 minutes. The patient's level of consciousness and physiologic status were continuously monitored during the procedure by Radiology nursing. PROCEDURE: The procedure risks, benefits, and alternatives were explained to the patient. Questions regarding the procedure were encouraged and answered. The patient understands and consents to the procedure. A time out was performed prior to initiating the procedure. The right gluteal region was prepped with chlorhexidine. Sterile gown and sterile gloves were used for the procedure. Local anesthesia was provided with 1% Lidocaine. Under CT guidance, an 11 gauge On Control bone cutting needle was advanced from a posterior approach into the right iliac bone. Needle positioning was confirmed with CT. Initial non heparinized and heparinized aspirate samples were obtained of bone marrow.  Core biopsy was performed via the On Control drill needle. COMPLICATIONS: None FINDINGS: Inspection of initial aspirate did reveal visible particles. Intact core biopsy sample was obtained. IMPRESSION: CT guided bone marrow biopsy of right posterior iliac bone with both aspirate and core samples obtained. Electronically Signed   By: Aletta Edouard M.D.   On: 12/04/2017 12:52   [place] Core Biopsy (lymph Nodes)  Result Date: 12/31/2017 INDICATION: Concern for metastatic lung cancer now with hypermetabolic left supraclavicular lymphadenopathy. Please perform ultrasound-guided cervical lymph node biopsy for tissue diagnostic purposes. EXAM: ULTRASOUND-GUIDED LEFT SUPRACLAVICULAR LYMPH NODE BIOPSY COMPARISON:  PET CT - 12/11/2018 MEDICATIONS: None ANESTHESIA/SEDATION: Moderate (conscious) sedation was employed during this procedure. A total of Versed 2 mg and Fentanyl 100 mcg was administered intravenously. Moderate Sedation Time: 16 minutes. The patient's level of consciousness and vital signs were monitored continuously by radiology nursing throughout the procedure under my direct supervision. COMPLICATIONS: None immediate. TECHNIQUE: Informed written consent was obtained from the patient after a discussion of the risks, benefits and alternatives to treatment. Questions regarding the  procedure were encouraged and answered. Initial ultrasound scanning demonstrated grossly unchanged size and appearance of hypermetabolic left supraclavicular lymph node measuring approximately 1.2 cm in greatest diameter (image 4), correlating with the hypermetabolic left supraclavicular lymph node seen on preceding PET-CT image 63, series 4. an ultrasound image was saved for documentation purposes. The procedure was planned. A timeout was performed prior to the initiation of the procedure. The operative was prepped and draped in the usual sterile fashion, and a sterile drape was applied covering the operative field. A timeout was  performed prior to the initiation of the procedure. Local anesthesia was provided with 1% lidocaine with epinephrine. Under direct ultrasound guidance, an 18 gauge core needle device was utilized to obtain to obtain 7 core needle biopsies of the hypermetabolic left supraclavicular lymph node. The samples were placed in saline and submitted to pathology. The needle was removed and hemostasis was achieved with manual compression. Post procedure scan was negative for significant hematoma. A dressing was placed. The patient tolerated the procedure well without immediate postprocedural complication. IMPRESSION: Technically successful ultrasound guided biopsy of hypermetabolic left supraclavicular lymph node. Electronically Signed   By: Sandi Mariscal M.D.   On: 12/31/2017 17:07    ASSESSMENT & PLAN:   72 y.o. female with  1. Lung Cancer, Metastatic to bone  Patient's labs upon presentation from 06/12/17, mild anemia with HGB at 11.6, no other cytopenias.   11/15/17 MRI Lumbar revealed Widespread marrow lesions consistent with metastatic disease. 2. Pathologic endplate fractures at L2. 3. Concerning left leg radiculopathy, there is far-lateral tumor infiltration at L2 which could affect extraforaminal nerve roots. No epidural tumor extension.   12/10/17 PET/CT revealed Large LEFT upper lobe nodule most consistent with bronchogenic Carcinoma. 2. LEFT suprahilar nodal mass which obstructs the LEFT upper lobe bronchus and extends into the AP window consistent with nodal Metastasis. 3. Additional nodal metastasis in the mediastinum. 4. Distant skeletal metastasis involving the proximal femur, sacrum, multiple lesions in the thoracic and lumbar spine, ribs, and scapula. Favor skeletal metastasis from bronchogenic carcinoma over myeloma.   12/04/17 BM Bx revealed no evidence of malignancy   Palliative RT with Dr. [name] for RT for T3 related pain, lung and hip 12/24/17-01/14/17.   2. Cough with hemoptysis  3. Weight  loss, secondary to nausea and taste change   PLAN -Discussed pt labwork this week, 12/31/17: WBC at 11.5, Hg normal, ANC at 14.1.  -Started palliative RT with Dr. [name] for RT for T3, lung and hip related pain on 12/24/17. Plan to complete on 01/14/17.  -I briefly discussed with pt and family her Cervical LN biopsy on 12/31/17 is still pending. I contacted pathologist who noted evidence of tumor cells. Further evaluation is pending. I will obtain foundation one and PDL1 testing on her sample to see if she is eligible for targeted or immunotherapy. -Discussed 12/26/17 Brain MRI showed 1. Single 5 mm focus of enhancement within right anterior basal ganglia, likely metastasis of lung cancer. 2. Progression of advanced chronic microvascular ischemic changes in stable moderate volume loss of the brain from 2017. Multiple stable chronic infarcts as above.  -Will monitor this for now.  -Given her low appetite from persistent nausea and taste change, I recommend she f/u with PCP to start her on a pure insulin regimen with short and long acting insulin to allow her more flexibility.  -I also recommend warm water rinse with salt and baking soda can improve her taste as well.  -She will continue Zofran  daily. May consider Marinol or Mirtazapine to help nausea, appetite and sleep.  -I encouraged her to be active daily, improve eating -I discussed with palliative radiation she can continue Fentanyl 12.66mg patch with 287mDilaudid only for break-through pain. She may be able to back off patch once she completes radiation.  -I instructed her to pat dry and use moisturizer after showers. She should also avoid scratching with nails for her skin itching.  -Continue with Xgeva shot every 4 weeks  -Continue Dexamethasone for bone pain -Continue Tessalon pearls prn for cough -Will see the pt back in 3-4 weeks   Orders Placed This Encounter  Procedures  . CBC with Differential/Platelet    Standing Status:    Future    Standing Expiration Date:   02/06/2019  . [place]    Standing Status:   Future    Standing Expiration Date:   01/03/2019  . Magnesium    Standing Status:   Future    Standing Expiration Date:   01/02/2019    RTC with Dr KaIrene Limboith labs in 4 weeks    All of the patients questions were answered with apparent satisfaction. The patient knows to call the clinic with any problems, questions or concerns.  The total time spent in the appt was 25 minutes and more than 50% was on counseling and direct patient cares.    GaSullivan LoneD MS AAHIVMS SCSelect Specialty Hospital - JacksonTSurgical Specialties LLCematology/Oncology Physician CoMedical City Of Plano(Office):       [phone] cell):  [phone]):           [phone]/26/2019 3:32 PM  I, AmJoslyn Devonam acting as scribe for GaSullivan LoneMD.   .I have reviewed the above documentation for accuracy and completeness, and I agree with the above. .GBrunetta GeneraD

## 2018-01-02 NOTE — Telephone Encounter (Signed)
Printed calendar and avs. °

## 2018-01-03 ENCOUNTER — Ambulatory Visit
Admission: RE | Admit: 2018-01-03 | Discharge: 2018-01-03 | Disposition: A | Discharge status: Home / Self Care | Payer: Medicare Other | Source: Ambulatory Visit | Attending: Radiation Oncology | Admitting: Radiation Oncology

## 2018-01-03 DIAGNOSIS — M545 Low back pain: Secondary | ICD-10-CM | POA: Diagnosis not present

## 2018-01-03 DIAGNOSIS — Z510 Encounter for antineoplastic radiation therapy: Secondary | ICD-10-CM | POA: Diagnosis not present

## 2018-01-03 DIAGNOSIS — D649 Anemia, unspecified: Secondary | ICD-10-CM | POA: Diagnosis not present

## 2018-01-03 DIAGNOSIS — M129 Arthropathy, unspecified: Secondary | ICD-10-CM | POA: Diagnosis not present

## 2018-01-03 DIAGNOSIS — C7951 Secondary malignant neoplasm of bone: Secondary | ICD-10-CM | POA: Diagnosis not present

## 2018-01-03 DIAGNOSIS — R918 Other nonspecific abnormal finding of lung field: Secondary | ICD-10-CM | POA: Diagnosis not present

## 2018-01-03 DIAGNOSIS — M899 Disorder of bone, unspecified: Secondary | ICD-10-CM | POA: Diagnosis not present

## 2018-01-06 ENCOUNTER — Ambulatory Visit
Admission: RE | Admit: 2018-01-06 | Discharge: 2018-01-06 | Disposition: A | Discharge status: Home / Self Care | Payer: Medicare Other | Source: Ambulatory Visit | Attending: Radiation Oncology | Admitting: Radiation Oncology

## 2018-01-06 DIAGNOSIS — R918 Other nonspecific abnormal finding of lung field: Secondary | ICD-10-CM | POA: Diagnosis not present

## 2018-01-06 DIAGNOSIS — M899 Disorder of bone, unspecified: Secondary | ICD-10-CM | POA: Diagnosis not present

## 2018-01-06 DIAGNOSIS — C7951 Secondary malignant neoplasm of bone: Secondary | ICD-10-CM | POA: Diagnosis not present

## 2018-01-06 DIAGNOSIS — M129 Arthropathy, unspecified: Secondary | ICD-10-CM | POA: Diagnosis not present

## 2018-01-06 DIAGNOSIS — D649 Anemia, unspecified: Secondary | ICD-10-CM | POA: Diagnosis not present

## 2018-01-06 DIAGNOSIS — Z510 Encounter for antineoplastic radiation therapy: Secondary | ICD-10-CM | POA: Diagnosis not present

## 2018-01-06 DIAGNOSIS — M545 Low back pain: Secondary | ICD-10-CM | POA: Diagnosis not present

## 2018-01-07 ENCOUNTER — Ambulatory Visit
Admission: RE | Admit: 2018-01-07 | Discharge: 2018-01-07 | Disposition: A | Discharge status: Home / Self Care | Payer: Medicare Other | Source: Ambulatory Visit | Attending: Radiation Oncology | Admitting: Radiation Oncology

## 2018-01-07 DIAGNOSIS — R918 Other nonspecific abnormal finding of lung field: Secondary | ICD-10-CM | POA: Diagnosis not present

## 2018-01-07 DIAGNOSIS — C3490 Malignant neoplasm of unspecified part of unspecified bronchus or lung: Secondary | ICD-10-CM | POA: Diagnosis not present

## 2018-01-07 DIAGNOSIS — C7951 Secondary malignant neoplasm of bone: Secondary | ICD-10-CM | POA: Diagnosis not present

## 2018-01-07 DIAGNOSIS — E1169 Type 2 diabetes mellitus with other specified complication: Secondary | ICD-10-CM | POA: Diagnosis not present

## 2018-01-07 DIAGNOSIS — D649 Anemia, unspecified: Secondary | ICD-10-CM | POA: Diagnosis not present

## 2018-01-07 DIAGNOSIS — M899 Disorder of bone, unspecified: Secondary | ICD-10-CM | POA: Diagnosis not present

## 2018-01-07 DIAGNOSIS — R110 Nausea: Secondary | ICD-10-CM | POA: Diagnosis not present

## 2018-01-07 DIAGNOSIS — Z510 Encounter for antineoplastic radiation therapy: Secondary | ICD-10-CM | POA: Diagnosis not present

## 2018-01-07 DIAGNOSIS — M545 Low back pain: Secondary | ICD-10-CM | POA: Diagnosis not present

## 2018-01-07 DIAGNOSIS — M129 Arthropathy, unspecified: Secondary | ICD-10-CM | POA: Diagnosis not present

## 2018-01-07 DIAGNOSIS — I10 Essential (primary) hypertension: Secondary | ICD-10-CM | POA: Diagnosis not present

## 2018-01-07 LAB — CBC WITH DIFFERENTIAL (CANCER CENTER ONLY)
Abs Immature Granulocytes: 0.07 10*3/uL (ref 0.00–0.07)
Basophils Absolute: 0 10*3/uL (ref 0.0–0.1)
Basophils Relative: 1 %
Eosinophils Absolute: 0.1 10*3/uL (ref 0.0–0.5)
Eosinophils Relative: 2 %
HCT: 35.9 % — ABNORMAL LOW (ref 36.0–46.0)
Hemoglobin: 11.8 g/dL — ABNORMAL LOW (ref 12.0–15.0)
Immature Granulocytes: 1 %
Lymphocytes Relative: 8 %
Lymphs Abs: 0.6 10*3/uL — ABNORMAL LOW (ref 0.7–4.0)
MCH: 28.9 pg (ref 26.0–34.0)
MCHC: 32.9 g/dL (ref 30.0–36.0)
MCV: 87.8 fL (ref 80.0–100.0)
Monocytes Absolute: 0.7 10*3/uL (ref 0.1–1.0)
Monocytes Relative: 8 %
NEUTROS PCT: 80 %
Neutro Abs: 6.3 10*3/uL (ref 1.7–7.7)
Platelet Count: 120 10*3/uL — ABNORMAL LOW (ref 150–400)
RBC: 4.09 MIL/uL (ref 3.87–5.11)
RDW: 13.5 % (ref 11.5–15.5)
WBC Count: 7.8 10*3/uL (ref 4.0–10.5)
nRBC: 0 % (ref 0.0–0.2)

## 2018-01-07 LAB — BASIC METABOLIC PANEL - CANCER CENTER ONLY
Anion gap: 11 (ref 5–15)
BUN: 15 mg/dL (ref 8–23)
CALCIUM: 9.1 mg/dL (ref 8.9–10.3)
CO2: 22 mmol/L (ref 22–32)
Chloride: 93 mmol/L — ABNORMAL LOW (ref 98–111)
Creatinine: 1.06 mg/dL — ABNORMAL HIGH (ref 0.44–1.00)
GFR, Est AFR Am: >60 mL/min (ref >60)
GFR, Estimated: 52 mL/min — ABNORMAL LOW (ref >60)
Glucose, Bld: 283 mg/dL — ABNORMAL HIGH (ref 70–99)
Potassium: 5.2 mmol/L — ABNORMAL HIGH (ref 3.5–5.1)
Sodium: 126 mmol/L — ABNORMAL LOW (ref 135–145)

## 2018-01-09 ENCOUNTER — Ambulatory Visit
Admission: RE | Admit: 2018-01-09 | Discharge: 2018-01-09 | Disposition: A | Discharge status: Home / Self Care | Payer: Medicare Other | Source: Ambulatory Visit | Attending: Radiation Oncology | Admitting: Radiation Oncology

## 2018-01-09 DIAGNOSIS — R918 Other nonspecific abnormal finding of lung field: Secondary | ICD-10-CM | POA: Insufficient documentation

## 2018-01-09 DIAGNOSIS — R042 Hemoptysis: Secondary | ICD-10-CM | POA: Diagnosis not present

## 2018-01-09 DIAGNOSIS — M545 Low back pain: Secondary | ICD-10-CM | POA: Insufficient documentation

## 2018-01-09 DIAGNOSIS — R7989 Other specified abnormal findings of blood chemistry: Secondary | ICD-10-CM | POA: Insufficient documentation

## 2018-01-09 DIAGNOSIS — Z79899 Other long term (current) drug therapy: Secondary | ICD-10-CM | POA: Diagnosis not present

## 2018-01-09 DIAGNOSIS — I700 Atherosclerosis of aorta: Secondary | ICD-10-CM | POA: Insufficient documentation

## 2018-01-09 DIAGNOSIS — D649 Anemia, unspecified: Secondary | ICD-10-CM | POA: Diagnosis not present

## 2018-01-09 DIAGNOSIS — Z510 Encounter for antineoplastic radiation therapy: Secondary | ICD-10-CM | POA: Diagnosis not present

## 2018-01-09 DIAGNOSIS — E669 Obesity, unspecified: Secondary | ICD-10-CM | POA: Diagnosis not present

## 2018-01-09 DIAGNOSIS — M129 Arthropathy, unspecified: Secondary | ICD-10-CM | POA: Diagnosis not present

## 2018-01-09 DIAGNOSIS — R634 Abnormal weight loss: Secondary | ICD-10-CM | POA: Diagnosis not present

## 2018-01-09 DIAGNOSIS — Z8673 Personal history of transient ischemic attack (TIA), and cerebral infarction without residual deficits: Secondary | ICD-10-CM | POA: Insufficient documentation

## 2018-01-09 DIAGNOSIS — E119 Type 2 diabetes mellitus without complications: Secondary | ICD-10-CM | POA: Diagnosis not present

## 2018-01-09 DIAGNOSIS — E785 Hyperlipidemia, unspecified: Secondary | ICD-10-CM | POA: Diagnosis not present

## 2018-01-09 DIAGNOSIS — R112 Nausea with vomiting, unspecified: Secondary | ICD-10-CM | POA: Diagnosis not present

## 2018-01-09 DIAGNOSIS — M899 Disorder of bone, unspecified: Secondary | ICD-10-CM | POA: Insufficient documentation

## 2018-01-09 DIAGNOSIS — C7951 Secondary malignant neoplasm of bone: Secondary | ICD-10-CM | POA: Diagnosis not present

## 2018-01-09 DIAGNOSIS — Z86010 Personal history of colonic polyps: Secondary | ICD-10-CM | POA: Insufficient documentation

## 2018-01-10 ENCOUNTER — Ambulatory Visit
Admission: RE | Admit: 2018-01-10 | Discharge: 2018-01-10 | Disposition: A | Discharge status: Home / Self Care | Payer: Medicare Other | Source: Ambulatory Visit | Attending: Radiation Oncology | Admitting: Radiation Oncology

## 2018-01-10 DIAGNOSIS — M545 Low back pain: Secondary | ICD-10-CM | POA: Diagnosis not present

## 2018-01-10 DIAGNOSIS — Z510 Encounter for antineoplastic radiation therapy: Secondary | ICD-10-CM | POA: Diagnosis not present

## 2018-01-10 DIAGNOSIS — D649 Anemia, unspecified: Secondary | ICD-10-CM | POA: Diagnosis not present

## 2018-01-10 DIAGNOSIS — C7951 Secondary malignant neoplasm of bone: Secondary | ICD-10-CM | POA: Diagnosis not present

## 2018-01-10 DIAGNOSIS — M899 Disorder of bone, unspecified: Secondary | ICD-10-CM | POA: Diagnosis not present

## 2018-01-10 DIAGNOSIS — M129 Arthropathy, unspecified: Secondary | ICD-10-CM | POA: Diagnosis not present

## 2018-01-10 DIAGNOSIS — R918 Other nonspecific abnormal finding of lung field: Secondary | ICD-10-CM | POA: Diagnosis not present

## 2018-01-13 ENCOUNTER — Encounter: Payer: Self-pay | Admitting: *Deleted

## 2018-01-13 ENCOUNTER — Ambulatory Visit
Admission: RE | Admit: 2018-01-13 | Discharge: 2018-01-13 | Disposition: A | Discharge status: Home / Self Care | Payer: Medicare Other | Source: Ambulatory Visit | Attending: Radiation Oncology | Admitting: Radiation Oncology

## 2018-01-13 DIAGNOSIS — Z510 Encounter for antineoplastic radiation therapy: Secondary | ICD-10-CM | POA: Diagnosis not present

## 2018-01-13 DIAGNOSIS — D649 Anemia, unspecified: Secondary | ICD-10-CM | POA: Diagnosis not present

## 2018-01-13 DIAGNOSIS — M129 Arthropathy, unspecified: Secondary | ICD-10-CM | POA: Diagnosis not present

## 2018-01-13 DIAGNOSIS — C7951 Secondary malignant neoplasm of bone: Secondary | ICD-10-CM | POA: Diagnosis not present

## 2018-01-13 DIAGNOSIS — M545 Low back pain: Secondary | ICD-10-CM | POA: Diagnosis not present

## 2018-01-13 DIAGNOSIS — R918 Other nonspecific abnormal finding of lung field: Secondary | ICD-10-CM | POA: Diagnosis not present

## 2018-01-13 DIAGNOSIS — M899 Disorder of bone, unspecified: Secondary | ICD-10-CM | POA: Diagnosis not present

## 2018-01-13 NOTE — Progress Notes (Signed)
Oncology Nurse Navigator Documentation  Oncology Nurse Navigator Flowsheets 01/13/2018  Navigator Location [facility]  Navigator Encounter Type Other/per Dr. [name] I requested foundation one and PDL 1 on pathology [phone].    Barriers/Navigation Needs Coordination of Care  Interventions Coordination of Care  Coordination of Care Other  Acuity Level 2  Time Spent with Patient 30

## 2018-01-14 ENCOUNTER — Encounter: Payer: Self-pay | Admitting: Radiation Oncology

## 2018-01-14 ENCOUNTER — Ambulatory Visit
Admission: RE | Admit: 2018-01-14 | Discharge: 2018-01-14 | Disposition: A | Discharge status: Home / Self Care | Payer: Medicare Other | Source: Ambulatory Visit | Attending: Radiation Oncology | Admitting: Radiation Oncology

## 2018-01-14 DIAGNOSIS — M129 Arthropathy, unspecified: Secondary | ICD-10-CM | POA: Diagnosis not present

## 2018-01-14 DIAGNOSIS — M545 Low back pain: Secondary | ICD-10-CM | POA: Diagnosis not present

## 2018-01-14 DIAGNOSIS — R918 Other nonspecific abnormal finding of lung field: Secondary | ICD-10-CM | POA: Diagnosis not present

## 2018-01-14 DIAGNOSIS — Z510 Encounter for antineoplastic radiation therapy: Secondary | ICD-10-CM | POA: Diagnosis not present

## 2018-01-14 DIAGNOSIS — M899 Disorder of bone, unspecified: Secondary | ICD-10-CM | POA: Diagnosis not present

## 2018-01-14 DIAGNOSIS — C7951 Secondary malignant neoplasm of bone: Secondary | ICD-10-CM | POA: Diagnosis not present

## 2018-01-14 DIAGNOSIS — D649 Anemia, unspecified: Secondary | ICD-10-CM | POA: Diagnosis not present

## 2018-01-16 NOTE — Progress Notes (Signed)
  Radiation Oncology         [phone]) [phone] ________________________________  Name: Elizabeth Calhoun MRN: [id number]  Date: 01/14/2018  DOB: 09-03-1945  End of Treatment Note  Diagnosis:   73 y.o. female with  stage IV primary lung cancer presenting in the apex of the left lung (adenocarcinoma)  Indication for treatment:  Palliative       Radiation treatment dates:   12/24/2017 - 01/14/2018  Site/dose:    1. Left Lung / 35 Gy in 14 fractions 2. Lumbar Spine / 35 Gy in 14 fractions  Beams/energy:    1. 3D / 10X, 15X, 6X Photon 2. Isodose Plan / 10X, 15X Photon  Narrative: The patient tolerated radiation treatment relatively well.   Overall, her pain is much improved. Over the course of treatment, she experienced increased fatigue and difficulty swallowing solid foods. She also noted constipation. Her skin shows hyperpigmentation changes but no skin breakdown.  Plan: The patient has completed radiation treatment. The patient will return to radiation oncology clinic for routine followup in one month. She will also be seen by medical oncology later this month and should have results back on whether she can be a candidate for targeted therapy. I advised them to call or return sooner if they have any questions or concerns related to their recovery or treatment.  -----------------------------------  Blair Promise, PhD, MD  This document serves as a record of services personally performed by Gery Pray, MD. It was created on his behalf by Rae Lips, a trained medical scribe. The creation of this record is based on the scribe's personal observations and the provider's statements to them. This document has been checked and approved by the attending provider.

## 2018-01-19 ENCOUNTER — Encounter (HOSPITAL_COMMUNITY): Payer: Self-pay

## 2018-01-19 ENCOUNTER — Emergency Department (HOSPITAL_COMMUNITY): Payer: Medicare Other

## 2018-01-19 ENCOUNTER — Inpatient Hospital Stay (HOSPITAL_COMMUNITY)
Admission: EM | Admit: 2018-01-19 | Discharge: 2018-01-21 | DRG: 176 | Disposition: A | Discharge status: Home / Self Care | Payer: Medicare Other | Attending: Internal Medicine | Admitting: Internal Medicine

## 2018-01-19 ENCOUNTER — Other Ambulatory Visit: Payer: Self-pay

## 2018-01-19 DIAGNOSIS — C7951 Secondary malignant neoplasm of bone: Secondary | ICD-10-CM | POA: Diagnosis present

## 2018-01-19 DIAGNOSIS — Z8249 Family history of ischemic heart disease and other diseases of the circulatory system: Secondary | ICD-10-CM | POA: Diagnosis not present

## 2018-01-19 DIAGNOSIS — R000 Tachycardia, unspecified: Secondary | ICD-10-CM | POA: Diagnosis not present

## 2018-01-19 DIAGNOSIS — C3432 Malignant neoplasm of lower lobe, left bronchus or lung: Secondary | ICD-10-CM | POA: Diagnosis not present

## 2018-01-19 DIAGNOSIS — I10 Essential (primary) hypertension: Secondary | ICD-10-CM | POA: Diagnosis present

## 2018-01-19 DIAGNOSIS — E785 Hyperlipidemia, unspecified: Secondary | ICD-10-CM | POA: Diagnosis present

## 2018-01-19 DIAGNOSIS — Z79891 Long term (current) use of opiate analgesic: Secondary | ICD-10-CM

## 2018-01-19 DIAGNOSIS — E1069 Type 1 diabetes mellitus with other specified complication: Secondary | ICD-10-CM | POA: Diagnosis not present

## 2018-01-19 DIAGNOSIS — Z833 Family history of diabetes mellitus: Secondary | ICD-10-CM | POA: Diagnosis not present

## 2018-01-19 DIAGNOSIS — E1165 Type 2 diabetes mellitus with hyperglycemia: Secondary | ICD-10-CM | POA: Diagnosis present

## 2018-01-19 DIAGNOSIS — T380X5A Adverse effect of glucocorticoids and synthetic analogues, initial encounter: Secondary | ICD-10-CM | POA: Diagnosis present

## 2018-01-19 DIAGNOSIS — I2699 Other pulmonary embolism without acute cor pulmonale: Secondary | ICD-10-CM | POA: Diagnosis present

## 2018-01-19 DIAGNOSIS — Z823 Family history of stroke: Secondary | ICD-10-CM

## 2018-01-19 DIAGNOSIS — E669 Obesity, unspecified: Secondary | ICD-10-CM | POA: Diagnosis present

## 2018-01-19 DIAGNOSIS — Z886 Allergy status to analgesic agent status: Secondary | ICD-10-CM

## 2018-01-19 DIAGNOSIS — Z923 Personal history of irradiation: Secondary | ICD-10-CM

## 2018-01-19 DIAGNOSIS — Z8673 Personal history of transient ischemic attack (TIA), and cerebral infarction without residual deficits: Secondary | ICD-10-CM | POA: Diagnosis not present

## 2018-01-19 DIAGNOSIS — Z794 Long term (current) use of insulin: Secondary | ICD-10-CM

## 2018-01-19 DIAGNOSIS — Z79899 Other long term (current) drug therapy: Secondary | ICD-10-CM

## 2018-01-19 DIAGNOSIS — Z7902 Long term (current) use of antithrombotics/antiplatelets: Secondary | ICD-10-CM | POA: Diagnosis not present

## 2018-01-19 DIAGNOSIS — I361 Nonrheumatic tricuspid (valve) insufficiency: Secondary | ICD-10-CM | POA: Diagnosis not present

## 2018-01-19 DIAGNOSIS — E1169 Type 2 diabetes mellitus with other specified complication: Secondary | ICD-10-CM | POA: Diagnosis not present

## 2018-01-19 DIAGNOSIS — C3412 Malignant neoplasm of upper lobe, left bronchus or lung: Secondary | ICD-10-CM | POA: Diagnosis present

## 2018-01-19 DIAGNOSIS — R0602 Shortness of breath: Secondary | ICD-10-CM | POA: Diagnosis not present

## 2018-01-19 LAB — I-STAT TROPONIN, ED: Troponin i, poc: 0.01 ng/mL (ref 0.00–0.08)

## 2018-01-19 LAB — COMPREHENSIVE METABOLIC PANEL
ALT: 12 U/L (ref 0–44)
AST: 16 U/L (ref 15–41)
Albumin: 3.1 g/dL — ABNORMAL LOW (ref 3.5–5.0)
Alkaline Phosphatase: 186 U/L — ABNORMAL HIGH (ref 38–126)
Anion gap: 10 (ref 5–15)
BUN: 12 mg/dL (ref 8–23)
CO2: 23 mmol/L (ref 22–32)
Calcium: 8.1 mg/dL — ABNORMAL LOW (ref 8.9–10.3)
Chloride: 101 mmol/L (ref 98–111)
Creatinine, Ser: 0.79 mg/dL (ref 0.44–1.00)
GFR calc Af Amer: >60 mL/min (ref >60)
GFR calc non Af Amer: >60 mL/min (ref >60)
Glucose, Bld: 210 mg/dL — ABNORMAL HIGH (ref 70–99)
Potassium: 3.8 mmol/L (ref 3.5–5.1)
Sodium: 134 mmol/L — ABNORMAL LOW (ref 135–145)
TOTAL PROTEIN: 6.2 g/dL — AB (ref 6.5–8.1)
Total Bilirubin: 0.6 mg/dL (ref 0.3–1.2)

## 2018-01-19 LAB — GLUCOSE, CAPILLARY: Glucose-Capillary: 212 mg/dL — ABNORMAL HIGH (ref 70–99)

## 2018-01-19 LAB — APTT: aPTT: 29 seconds (ref 24–36)

## 2018-01-19 LAB — PROTIME-INR
INR: 1.17
Prothrombin Time: 14.8 seconds (ref 11.4–15.2)

## 2018-01-19 LAB — CBC
HEMATOCRIT: 33.4 % — AB (ref 36.0–46.0)
Hemoglobin: 10.8 g/dL — ABNORMAL LOW (ref 12.0–15.0)
MCH: 28.9 pg (ref 26.0–34.0)
MCHC: 32.3 g/dL (ref 30.0–36.0)
MCV: 89.3 fL (ref 80.0–100.0)
Platelets: 188 10*3/uL (ref 150–400)
RBC: 3.74 MIL/uL — ABNORMAL LOW (ref 3.87–5.11)
RDW: 14.5 % (ref 11.5–15.5)
WBC: 6.4 10*3/uL (ref 4.0–10.5)
nRBC: 0 % (ref 0.0–0.2)

## 2018-01-19 LAB — BRAIN NATRIURETIC PEPTIDE: B Natriuretic Peptide: 49.2 pg/mL (ref 0.0–100.0)

## 2018-01-19 MED ORDER — IOPAMIDOL (ISOVUE-370) INJECTION 76%
80.0000 mL | Freq: Once | INTRAVENOUS | Status: AC | PRN
  Administered 2018-01-19: 80 mL via INTRAVENOUS

## 2018-01-19 MED ORDER — SODIUM CHLORIDE (PF) 0.9 % IJ SOLN
INTRAMUSCULAR | Status: AC
  Filled 2018-01-19: qty 50

## 2018-01-19 MED ORDER — HYDROMORPHONE HCL 2 MG PO TABS: 2.0000 mg | ORAL_TABLET | ORAL | Status: DC | PRN

## 2018-01-19 MED ORDER — FENTANYL 12 MCG/HR TD PT72
12.0000 ug | MEDICATED_PATCH | TRANSDERMAL | Status: DC
  Administered 2018-01-19: 12.5 ug via TRANSDERMAL
  Filled 2018-01-19: qty 1

## 2018-01-19 MED ORDER — SODIUM CHLORIDE 0.9% FLUSH
3.0000 mL | Freq: Two times a day (BID) | INTRAVENOUS | Status: DC
  Administered 2018-01-20 – 2018-01-21 (×2): 3 mL via INTRAVENOUS

## 2018-01-19 MED ORDER — ALBUTEROL SULFATE (2.5 MG/3ML) 0.083% IN NEBU
5.0000 mg | INHALATION_SOLUTION | Freq: Once | RESPIRATORY_TRACT | Status: AC
  Administered 2018-01-19: 5 mg via RESPIRATORY_TRACT
  Filled 2018-01-19: qty 6

## 2018-01-19 MED ORDER — ACETAMINOPHEN 650 MG RE SUPP: 650.0000 mg | Freq: Four times a day (QID) | RECTAL | Status: DC | PRN

## 2018-01-19 MED ORDER — ONDANSETRON HCL 4 MG/2ML IJ SOLN: 4.0000 mg | Freq: Four times a day (QID) | INTRAMUSCULAR | Status: DC | PRN

## 2018-01-19 MED ORDER — INSULIN ASPART 100 UNIT/ML ~~LOC~~ SOLN
0.0000 [IU] | Freq: Three times a day (TID) | SUBCUTANEOUS | Status: DC
  Administered 2018-01-20: 5 [IU] via SUBCUTANEOUS
  Administered 2018-01-20 (×2): 9 [IU] via SUBCUTANEOUS
  Administered 2018-01-21: 3 [IU] via SUBCUTANEOUS
  Administered 2018-01-21: 7 [IU] via SUBCUTANEOUS

## 2018-01-19 MED ORDER — IOPAMIDOL (ISOVUE-370) INJECTION 76%
INTRAVENOUS | Status: AC
  Filled 2018-01-19: qty 100

## 2018-01-19 MED ORDER — SODIUM CHLORIDE 0.9% FLUSH: 3.0000 mL | INTRAVENOUS | Status: DC | PRN

## 2018-01-19 MED ORDER — SODIUM CHLORIDE 0.9 % IV SOLN: 250.0000 mL | INTRAVENOUS | Status: DC | PRN

## 2018-01-19 MED ORDER — HEPARIN (PORCINE) 25000 UT/250ML-% IV SOLN
1000.0000 [IU]/h | INTRAVENOUS | Status: DC
  Administered 2018-01-19: 1000 [IU]/h via INTRAVENOUS
  Filled 2018-01-19: qty 250

## 2018-01-19 MED ORDER — DEXAMETHASONE 4 MG PO TABS
4.0000 mg | ORAL_TABLET | Freq: Every day | ORAL | Status: DC
  Administered 2018-01-19 – 2018-01-21 (×3): 4 mg via ORAL
  Filled 2018-01-19 (×4): qty 1

## 2018-01-19 MED ORDER — ONDANSETRON HCL 4 MG PO TABS: 4.0000 mg | ORAL_TABLET | Freq: Four times a day (QID) | ORAL | Status: DC | PRN

## 2018-01-19 MED ORDER — ZOLPIDEM TARTRATE 5 MG PO TABS
5.0000 mg | ORAL_TABLET | Freq: Every day | ORAL | Status: DC
  Administered 2018-01-19 – 2018-01-20 (×2): 5 mg via ORAL
  Filled 2018-01-19 (×2): qty 1

## 2018-01-19 MED ORDER — HEPARIN BOLUS VIA INFUSION
4000.0000 [IU] | Freq: Once | INTRAVENOUS | Status: AC
  Administered 2018-01-19: 4000 [IU] via INTRAVENOUS
  Filled 2018-01-19: qty 4000

## 2018-01-19 MED ORDER — ROSUVASTATIN CALCIUM 20 MG PO TABS
40.0000 mg | ORAL_TABLET | Freq: Every day | ORAL | Status: DC
  Administered 2018-01-19 – 2018-01-21 (×3): 40 mg via ORAL
  Filled 2018-01-19 (×3): qty 2

## 2018-01-19 MED ORDER — ACETAMINOPHEN 325 MG PO TABS: 650.0000 mg | ORAL_TABLET | Freq: Four times a day (QID) | ORAL | Status: DC | PRN

## 2018-01-19 NOTE — ED Triage Notes (Signed)
Pt was dx with stage 4 cancer 6 weeks ago, but has been short of breath for the last week. Pt has also had swelling in the legs. Last radiation was last Tuesday. Pt was recently taken off HCTZ 25mg  per niece. Pt was also recently taken off "diabetes medicine.

## 2018-01-19 NOTE — H&P (Signed)
History and Physical    Elizabeth Calhoun MVH:[id number] DOB: 30-Oct-1945 DOA: 01/19/2018  PCP: Renford Dills, MD   Patient coming from: Home   Chief Complaint: Dyspnea  HPI: Elizabeth Calhoun is a 73 y.o. female with medical history significant of metastatic lung cancer.  She does have a large left upper lobe nodule, most consistent with bronchogenic carcinoma, left suprahilar nodal mass which was obstructing the left upper lobe bronchus.  Positive distant skeletal metastasis involving the proximal femur, sacrum and multiple lesions in thoracic lumbar spine, ribs and scapula.  She has received palliative radiation therapy, and a cervical lymph node biopsy positive for adenocarcinoma.   For the last 7 days she has been experiencing progressive and worsening dyspnea, severe in intensity, worse with exertion associated with dry cough, no hemoptysis, no improving factors.  Diuretic therapy was discontinued 2 weeks ago by her primary care provider, now developing progressive lower extremity edema.   Today while trying to get ready to go to church she was noticed to have severe dyspnea with minimal efforts, that prompted her to be brought to the hospital by her family.   ED Course: Patient was found hemodynamically stable, not hypoxic, CT angiography showed pulmonary embolism.  She has been started on heparin drip and was referred for admission for evaluation.  Review of Systems:  1. General: No fevers, no chills, no weight gain or weight loss 2. ENT: No runny nose or sore throat, no hearing disturbances 3. Pulmonary: Positive dyspnea, cough, as mentioned in HPI, no wheezing, or hemoptysis 4. Cardiovascular: No angina, claudication, positive lower extremity edema, but no pnd or orthopnea 5. Gastrointestinal: No nausea or vomiting, no diarrhea or constipation. Positive odynophagia and dysphagia.  6. Hematology: No easy bruisability or frequent infections 7. Urology: No dysuria, hematuria or  increased urinary frequency 8. Dermatology: No rashes. 9. Neurology: No seizures or paresthesias 10. Musculoskeletal: No joint pain or deformities  Past Medical History:  Diagnosis Date  . Adenomatous colon polyp   . Diabetes mellitus without complication (HCC)   . Hemorrhoids   . Menopause   . OA (osteoarthritis)   . Obesity   . Other and unspecified hyperlipidemia   . Stroke [facility])     Past Surgical History:  Procedure Laterality Date  . HYSTERECTOMY ABDOMINAL WITH SALPINGO-OOPHORECTOMY  1990  . KNEE ARTHROSCOPY Left 2009  . SHOULDER SURGERY  2005     reports that she has never smoked. She has never used smokeless tobacco. She reports previous alcohol use. She reports that she does not use drugs.  Allergies  Allergen Reactions  . Aspirin Other (See Comments)    Causes lethargy    Family History  Problem Relation Age of Onset  . Heart attack Father   . Heart disease Brother        1/8  . Heart disease Brother        2/8  . Heart disease Brother        3/8  . Heart disease Brother        4/8  . Heart disease Brother        5/8  . Heart disease Brother        6/8  . Cancer Brother        7/8  . Seizures Brother        8/8  . Stroke Brother        8/8  . Heart disease Sister        1/6  . Alzheimer's  disease Sister        2/6  . Diabetes Sister        3/6  . Hypertension Sister        3/6  . Hypertension Sister        4/6  . Diabetes Sister        4/6  . Diabetes Daughter      Prior to Admission medications   Medication Sig Start Date End Date Taking? Authorizing Provider  ASPERCREME LIDOCAINE EX Apply 1 application topically daily as needed (muscle aches).   Yes [provider]  benzonatate (TESSALON) 100 MG capsule Take 1 capsule (100 mg total) by mouth 3 (three) times daily as needed for cough. 11/28/17  Yes Johney Maine, MD  clopidogrel (PLAVIX) 75 MG tablet Take 75 mg by mouth daily. 11/23/16  Yes [provider]    dexamethasone (DECADRON) 4 MG tablet 1 tab with breakfast and lunch for 3-4 days then 1 tab with breakfast only 12/10/17  Yes Johney Maine, MD  diphenhydrAMINE (BENADRYL) 25 mg capsule Take 25 mg by mouth daily.   Yes [provider]  fentaNYL (DURAGESIC - DOSED MCG/HR) 12 MCG/HR Place 1 patch (12.5 mcg total) onto the skin every 3 (three) days. 12/10/17  Yes Johney Maine, MD  HYDROmorphone (DILAUDID) 2 MG tablet Take 1-2 tablets (2-4 mg total) by mouth every 4 (four) hours as needed for severe pain. 12/10/17  Yes Johney Maine, MD  insulin lispro (HUMALOG) 100 UNIT/ML injection Inject 10 Units into the skin at bedtime.   Yes [provider]  losartan (COZAAR) 25 MG tablet Take 25 mg by mouth daily.   Yes [provider]  ondansetron (ZOFRAN ODT) 4 MG disintegrating tablet Take 1 tablet (4 mg total) by mouth every 8 (eight) hours as needed for nausea or vomiting. 12/07/17  Yes Long, Arlyss Repress, MD  rosuvastatin (CRESTOR) 40 MG tablet Take 40 mg by mouth daily. 10/01/16  Yes [provider]  zolpidem (AMBIEN) 10 MG tablet Take 10 mg by mouth at bedtime.    Yes [provider]  azithromycin (ZITHROMAX) 250 MG tablet Take 1 tablet daily Patient not taking: Reported on 11/28/2017 06/13/17   Jacalyn Lefevre, MD  hydrochlorothiazide (HYDRODIURIL) 25 MG tablet Take 25 mg by mouth daily. 10/09/16   [provider]  LORazepam (ATIVAN) 0.5 MG tablet Take 1-2 tablets (0.5-1 mg total) by mouth once as needed for up to 1 dose (30-72mins prior to MRI or CT for anxiety/claustrophobia). Patient not taking: Reported on 01/02/2018 12/23/17   Johney Maine, MD    Physical Exam: Vitals:   01/19/18 1551 01/19/18 1652 01/19/18 1700 01/19/18 1730  BP:  140/83 117/70 113/71  Pulse:  98 93 94  Resp:   (!) 29   Temp:      TempSrc:      SpO2: (S) 93% 96% 97% 95%  Weight:      Height:        Vitals:   01/19/18 1551 01/19/18 1652 01/19/18  1700 01/19/18 1730  BP:  140/83 117/70 113/71  Pulse:  98 93 94  Resp:   (!) 29   Temp:      TempSrc:      SpO2: (S) 93% 96% 97% 95%  Weight:      Height:       General: deconditioned and ill looking appearing.  Neurology: Awake and alert, non focal Head and Neck. Head normocephalic. Neck supple  with no adenopathy or thyromegaly.   E ENT: mild pallor, no icterus, oral mucosa moist Cardiovascular: No JVD. S1-S2 present, rhythmic, no gallops, rubs, or murmurs. ++ bilateral pitting lower extremity edema. Pulmonary: positive breath sounds bilaterally, poor inspiratory effort, no wheezing, rhonchi or rales. Gastrointestinal. Abdomen with no organomegaly, non tender, no rebound or guarding Skin. No rashes Musculoskeletal: no joint deformities    Labs on Admission: I have personally reviewed following labs and imaging studies  CBC: Recent Labs  Lab 01/19/18 1439  WBC 6.4  HGB 10.8*  HCT 33.4*  MCV 89.3  PLT 188   Basic Metabolic Panel: Recent Labs  Lab 01/19/18 1439  NA 134*  K 3.8  CL 101  CO2 23  GLUCOSE 210*  BUN 12  CREATININE 0.79  CALCIUM 8.1*   GFR: Estimated Creatinine Clearance: 52.7 mL/min (by C-G formula based on SCr of 0.79 mg/dL). Liver Function Tests: Recent Labs  Lab 01/19/18 1439  AST 16  ALT 12  ALKPHOS 186*  BILITOT 0.6  PROT 6.2*  ALBUMIN 3.1*   No results for input(s): LIPASE, AMYLASE in the last 168 hours. No results for input(s): AMMONIA in the last 168 hours. Coagulation Profile: No results for input(s): INR, PROTIME in the last 168 hours. Cardiac Enzymes: No results for input(s): CKTOTAL, CKMB, CKMBINDEX, TROPONINI in the last 168 hours. BNP (last 3 results) No results for input(s): PROBNP in the last 8760 hours. HbA1C: No results for input(s): HGBA1C in the last 72 hours. CBG: No results for input(s): GLUCAP in the last 168 hours. Lipid Profile: No results for input(s): CHOL, HDL, LDLCALC, TRIG, CHOLHDL, LDLDIRECT in the last  72 hours. Thyroid Function Tests: No results for input(s): TSH, T4TOTAL, FREET4, T3FREE, THYROIDAB in the last 72 hours. Anemia Panel: No results for input(s): VITAMINB12, FOLATE, FERRITIN, TIBC, IRON, RETICCTPCT in the last 72 hours. Urine analysis:    Component Value Date/Time   COLORURINE YELLOW 06/12/2017 2043   APPEARANCEUR CLEAR 06/12/2017 2043   LABSPEC 1.018 06/12/2017 2043   PHURINE 5.0 06/12/2017 2043   GLUCOSEU >=500 (A) 06/12/2017 2043   HGBUR NEGATIVE 06/12/2017 2043   BILIRUBINUR NEGATIVE 06/12/2017 2043   KETONESUR 5 (A) 06/12/2017 2043   PROTEINUR NEGATIVE 06/12/2017 2043   UROBILINOGEN 0.2 01/10/2010 1749   NITRITE NEGATIVE 06/12/2017 2043   LEUKOCYTESUR NEGATIVE 06/12/2017 2043    Radiological Exams on Admission: Dg Chest 2 View  Result Date: 01/19/2018 CLINICAL DATA:  Shortness of breath. EXAM: CHEST - 2 VIEW COMPARISON:  PET scan of December 10, 2017. Radiographs of December 07, 2017. FINDINGS: Stable cardiac silhouette. Left hilar spiculated abnormality is noted consistent with malignancy as described on PET scan. No pneumothorax is noted. No pleural effusion is noted. Elevated left hemidiaphragm is noted. Right lung is clear. No acute consolidative process is noted. Bony thorax is unremarkable. IMPRESSION: Stable left hilar spiculated density is noted consistent with malignancy as described on PET scan. No significant changes noted compared to prior exam. Electronically Signed   By: Lupita Raider, M.D.   On: 01/19/2018 14:02   Ct Angio Chest Pe W/cm &/or Wo Cm  Result Date: 01/19/2018 CLINICAL DATA:  Worsening shortness of breath over the past few days. Recently diagnosed with metastatic lung cancer. EXAM: CT ANGIOGRAPHY CHEST WITH CONTRAST TECHNIQUE: Multidetector CT imaging of the chest was performed using the standard protocol during bolus administration of intravenous contrast. Multiplanar CT image reconstructions and MIPs were obtained to evaluate the vascular  anatomy. CONTRAST:  80mL  ISOVUE-370 IOPAMIDOL (ISOVUE-370) INJECTION 76% COMPARISON:  PET-CT dated December 10, 2017. CT chest dated December 07, 2017. FINDINGS: Cardiovascular: Acute segmental pulmonary emboli within the right upper and lower lobes. No central or lobar pulmonary emboli. Elevated RV/LV ratio of 1.6. No pericardial effusion. No thoracic aortic aneurysm or dissection. Coronary, aortic arch, and branch vessel atherosclerotic vascular disease. Mediastinum/Nodes: Unchanged aortopulmonary window nodal mass measuring 3.8 x 4.0 cm, this again completely encases the left pulmonary artery and left upper lobe bronchus. Other scattered mediastinal lymph nodes measuring up to 1 cm in short axis are unchanged. Stable to slightly decreased left hilar lymph node measuring 1.3 cm, previously 1.5 cm. No axillary right hilar lymphadenopathy. The thyroid gland, trachea, and esophagus demonstrate no significant findings. Lungs/Pleura: Unchanged 3.1 cm mass in the left apical of lobe. Additional scattered nodules in the left upper lobe are also unchanged. Stable multifocal pleural nodularity in the left hemithorax. No pleural effusion or pneumothorax. Upper Abdomen: No acute abnormality. Musculoskeletal: Unchanged lytic metastases involving the left scapula, left T2 pedicle and transverse process, and T11 vertebral body. Review of the MIP images confirms the above findings. IMPRESSION: 1. Acute segmental pulmonary emboli within the right upper and lower lobes. Evidence of right heart strain. 2. Unchanged 3.1 cm left apical lung mass with left upper lobe, left pleural, nodal, and osseous metastatic disease. The 4.0 cm aortopulmonary window nodal mass severely narrows the left pulmonary artery and left upper lobe bronchus. 3. Aortic atherosclerosis (ICD10-I70.0). Critical Value/emergent results were called by telephone at the time of interpretation on 01/19/2018 at 5:17 pm to Dr. Rush Landmark, who verbally acknowledged these  results. Electronically Signed   By: Obie Dredge M.D.   On: 01/19/2018 17:26    EKG: Independently reviewed.  Rhythm rhym, rate of 113 bpm, left axis deviation, no ST changes or significant T wave abnormalities.  Assessment/Plan Active Problems:   Pulmonary embolism [facility])  This is a 73 year old female who presents with worsening dyspnea, progressive to the point where she has symptoms with minimal efforts.  No hemoptysis or frank chest pain.  She does have a recent diagnosis of metastatic lung cancer, currently on radiation therapy, lymph node biopsy resulted in adenocarcinoma.  On her initial physical examination temperature 37.9 C, blood pressure 150/76, heart rate 116, respiratory rate 22, oxygen saturation 96% on room air.  Neck with no JVD, lungs clear to auscultation, heart S1-S2 present and rhythmic, abdomen soft nontender, positive lower extremity edema 2+ bilaterally.  Sodium 134, potassium 3.8, chloride 101, bicarb 23, glucose 210, BUN 12, creatinine 0.79, white count 6.4, hemoglobin 10.8, hematocrit 33.4, platelets 188.  Chest x-ray with left hilar spiculated mass, elevated left hemidiaphragm.  Chest CT angiography with acute segmental pulmonary emboli within the right upper lobe and lower lobe.  Evidence of right heart strain.  Unchanged 3.1 cm left apical lung mass, positive left pleural, nodal and osseous metastatic disease.  4.0 cm nodal mass severely narrows the left pulmonary artery and left upper lobe bronchus.   Patient will be admitted to the hospital with a working diagnosis of acute pulmonary embolism.  1.  Acute bilateral pulmonary embolism.  Likely related to malignancy. Patient will be admitted to the medical ward, she will be placed on a remote telemetry monitor, continue anticoagulation with IV unfractionated heparin.  Supplemental oxygen per nasal cannula to target O2 saturation greater 92%, continue oximetry monitoring.  Further work-up with echocardiography.  Hold on  antihypertensive agents or diuretics for now, due to risk of  hypotension.  If patient remains hemodynamically stable likely can be transitioned to a direct oral anticoagulant or subcutaneous enoxaparin.  Will hold clopidogrel due to risk of bleeding.  2.  Metastatic lung adenocarcinoma.  Patient has received radiation therapy, her biopsy has resulted in adenocarcinoma.  She does have large burden of lung malignancy that can certainly be related to her dyspnea and decrease in physical exertion capacity.  Will follow-up with oncology recommendations as an outpatient.  Continue Decadron.  Continue analgesics with fentanyl and hydromorphone.  3.  Hypertension.  Will hold antihypertensive agents, losartan and hydrochlorothiazide due to risk of hypotension, continue blood pressure monitoring.  4.  Type 2 diabetes mellitus.  At home patient taking insulin lispro 10 units at night, her admission glucose is 210, patient on dexamethasone.  Will cover patient with insulin sliding scale and will calculate insulin requirements before starting a basal long-acting insulin.  5.  Dyslipidemia.  Continue rosuvastatin.  DVT prophylaxis: heparin  Code Status: full  Family Communication: I spoke with patient's family at the bedside and all questions were addressed.   Disposition Plan:  Telemetry   Consults called: none   Admission status: Inpatient    Elizabeth Calhoun Annett Gula MD [facility] Pager [phone]  If 7PM-7AM, please contact night-coverage [email or web address] Password [facility]  01/19/2018, 5:48 PM

## 2018-01-19 NOTE — ED Notes (Signed)
ED TO INPATIENT HANDOFF REPORT  Name/Age/Gender Elizabeth Calhoun 73 y.o. female  Code Status Advance Directive Documentation     Most Recent Value  Type of Advance Directive  Healthcare Power of Attorney, Living will  Pre-existing out of facility DNR order (yellow form or pink MOST form)  -  "MOST" Form in Place?  -      Home/SNF/Other Home  Chief Complaint ca pt; shob  Level of Care/Admitting Diagnosis ED Disposition    ED Disposition Condition [place]: [place] [[id number]]  Level of Care: Telemetry [5]  Admit to tele based on following criteria: Other see comments  Comments: PE  Diagnosis: Pulmonary embolism [facility]) [[id number]]  Admitting Physician: Tawni Millers [[id number]]  Attending Physician: Tawni Millers [[id number]]  Estimated length of stay: 3 - 4 days  Certification:: I certify this patient will need inpatient services for at least 2 midnights  PT Class (Do Not Modify): Inpatient [101]  PT Acc Code (Do Not Modify): Private [1]       Medical History Past Medical History:  Diagnosis Date  . Adenomatous colon polyp   . Diabetes mellitus without complication ([place])   . Hemorrhoids   . Menopause   . OA (osteoarthritis)   . Obesity   . Other and unspecified hyperlipidemia   . Stroke [facility])     Allergies Allergies  Allergen Reactions  . Aspirin Other (See Comments)    Causes lethargy    IV Location/Drains/Wounds Patient Lines/Drains/Airways Status   Active Line/Drains/Airways    Name:   Placement date:   Placement time:   Site:   Days:   Peripheral IV 01/19/18 Left;Distal Forearm   01/19/18    1548    Forearm   less than 1          Labs/Imaging Results for orders placed or performed during the hospital encounter of 01/19/18 (from the past 48 hour(s))  CBC     Status: Abnormal   Collection Time: 01/19/18  2:39 PM  Result Value Ref Range   WBC 6.4 4.0 - 10.5 K/uL   RBC 3.74 (L) 3.87 - 5.11  MIL/uL   Hemoglobin 10.8 (L) 12.0 - 15.0 g/dL   HCT 33.4 (L) 36.0 - 46.0 %   MCV 89.3 80.0 - 100.0 fL   MCH 28.9 26.0 - 34.0 pg   MCHC 32.3 30.0 - 36.0 g/dL   RDW 14.5 11.5 - 15.5 %   Platelets 188 150 - 400 K/uL   nRBC 0.0 0.0 - 0.2 %    Comment: Performed at [facility], [place] [address], [place], [place] [address]  Comprehensive metabolic panel     Status: Abnormal   Collection Time: 01/19/18  2:39 PM  Result Value Ref Range   Sodium 134 (L) 135 - 145 mmol/L   Potassium 3.8 3.5 - 5.1 mmol/L   Chloride 101 98 - 111 mmol/L   CO2 23 22 - 32 mmol/L   Glucose, Bld 210 (H) 70 - 99 mg/dL   BUN 12 8 - 23 mg/dL   Creatinine, Ser 0.79 0.44 - 1.00 mg/dL   Calcium 8.1 (L) 8.9 - 10.3 mg/dL   Total Protein 6.2 (L) 6.5 - 8.1 g/dL   Albumin 3.1 (L) 3.5 - 5.0 g/dL   AST 16 15 - 41 U/L   ALT 12 0 - 44 U/L   Alkaline Phosphatase 186 (H) 38 - 126 U/L   Total Bilirubin 0.6 0.3 -  1.2 mg/dL   GFR calc non Af Amer >60 >60 mL/min   GFR calc Af Amer >60 >60 mL/min   Anion gap 10 5 - 15    Comment: Performed at [facility], [place] [address], [place], [place] [address]  Brain natriuretic peptide     Status: None   Collection Time: 01/19/18  2:39 PM  Result Value Ref Range   B Natriuretic Peptide 49.2 0.0 - 100.0 pg/mL    Comment: Performed at [facility], [place] [address] [place], [place] [address]  I-Stat Troponin, ED (not at [facility])     Status: None   Collection Time: 01/19/18  6:11 PM  Result Value Ref Range   Troponin i, poc 0.01 0.00 - 0.08 ng/mL   Comment 3            Comment: Due to the release kinetics of cTnI, a negative result within the first hours of the onset of symptoms does not rule out myocardial infarction with certainty. If myocardial infarction is still suspected, repeat the test at appropriate intervals.    Dg Chest 2 View  Result Date: 01/19/2018 CLINICAL DATA:  Shortness of breath. EXAM: CHEST - 2 VIEW COMPARISON:   PET scan of December 10, 2017. Radiographs of December 07, 2017. FINDINGS: Stable cardiac silhouette. Left hilar spiculated abnormality is noted consistent with malignancy as described on PET scan. No pneumothorax is noted. No pleural effusion is noted. Elevated left hemidiaphragm is noted. Right lung is clear. No acute consolidative process is noted. Bony thorax is unremarkable. IMPRESSION: Stable left hilar spiculated density is noted consistent with malignancy as described on PET scan. No significant changes noted compared to prior exam. Electronically Signed   By: Marijo Conception, M.D.   On: 01/19/2018 14:02   Ct Angio Chest Pe W/cm &/or Wo Cm  Result Date: 01/19/2018 CLINICAL DATA:  Worsening shortness of breath over the past few days. Recently diagnosed with metastatic lung cancer. EXAM: CT ANGIOGRAPHY CHEST WITH CONTRAST TECHNIQUE: Multidetector CT imaging of the chest was performed using the standard protocol during bolus administration of intravenous contrast. Multiplanar CT image reconstructions and MIPs were obtained to evaluate the vascular anatomy. CONTRAST:  53mL ISOVUE-370 IOPAMIDOL (ISOVUE-370) INJECTION 76% COMPARISON:  PET-CT dated December 10, 2017. CT chest dated December 07, 2017. FINDINGS: Cardiovascular: Acute segmental pulmonary emboli within the right upper and lower lobes. No central or lobar pulmonary emboli. Elevated RV/LV ratio of 1.6. No pericardial effusion. No thoracic aortic aneurysm or dissection. Coronary, aortic arch, and branch vessel atherosclerotic vascular disease. Mediastinum/Nodes: Unchanged aortopulmonary window nodal mass measuring 3.8 x 4.0 cm, this again completely encases the left pulmonary artery and left upper lobe bronchus. Other scattered mediastinal lymph nodes measuring up to 1 cm in short axis are unchanged. Stable to slightly decreased left hilar lymph node measuring 1.3 cm, previously 1.5 cm. No axillary right hilar lymphadenopathy. The thyroid gland,  trachea, and esophagus demonstrate no significant findings. Lungs/Pleura: Unchanged 3.1 cm mass in the left apical of lobe. Additional scattered nodules in the left upper lobe are also unchanged. Stable multifocal pleural nodularity in the left hemithorax. No pleural effusion or pneumothorax. Upper Abdomen: No acute abnormality. Musculoskeletal: Unchanged lytic metastases involving the left scapula, left T2 pedicle and transverse process, and T11 vertebral body. Review of the MIP images confirms the above findings. IMPRESSION: 1. Acute segmental pulmonary emboli within the right upper and lower lobes. Evidence of right heart strain. 2. Unchanged 3.1 cm left  apical lung mass with left upper lobe, left pleural, nodal, and osseous metastatic disease. The 4.0 cm aortopulmonary window nodal mass severely narrows the left pulmonary artery and left upper lobe bronchus. 3. Aortic atherosclerosis (ICD10-I70.0). Critical Value/emergent results were called by telephone at the time of interpretation on 01/19/2018 at 5:17 pm to Dr. Sherry Ruffing, who verbally acknowledged these results. Electronically Signed   By: Titus Dubin M.D.   On: 01/19/2018 17:26   EKG Interpretation  Date/Time:  Sunday January 19 2018 12:23:42 EST Ventricular Rate:  113 PR Interval:    QRS Duration: 76 QT Interval:  334 QTC Calculation: 458 R Axis:   -29 Text Interpretation:  Sinus tachycardia Borderline left axis deviation Confirmed by Steinl, Kevin ([id number]) on 01/19/2018 1:50:05 PM   Pending Labs Unresulted Labs (From admission, onward)    Start     Ordered   01/20/18 0500  CBC  Daily,   R     01/19/18 1742   01/19/18 1742  APTT  ONCE - STAT,   R     01/19/18 1742   01/19/18 1742  Protime-INR  ONCE - STAT,   R     01 /12/20 1742   Signed and Held  Basic metabolic panel  Tomorrow morning,   R     Signed and Held   Signed and Held  CBC  Tomorrow morning,   R     Signed and Held          Vitals/Pain Today's Vitals   01/19/18  1652 01/19/18 1652 01/19/18 1700 01/19/18 1730  BP: 140/83  117/70 113/71  Pulse: 98  93 94  Resp:   (!) 29   Temp:      TempSrc:      SpO2: 96%  97% 95%  Weight:      Height:      PainSc:  0-No pain      Isolation Precautions No active isolations  Medications Medications  sodium chloride (PF) 0.9 % injection (0 mLs  Hold 01/19/18 1609)  iopamidol (ISOVUE-370) 76 % injection (  Hold 01/19/18 1609)  heparin bolus via infusion 4,000 Units (has no administration in time range)  heparin ADULT infusion 100 units/mL (25000 units/211mL sodium chloride 0.45%) (has no administration in time range)  albuterol (PROVENTIL) (2.5 MG/3ML) 0.083% nebulizer solution 5 mg (5 mg Nebulization Given 01/19/18 1254)  iopamidol (ISOVUE-370) 76 % injection 80 mL (80 mLs Intravenous Contrast Given 01/19/18 1610)    Mobility walks

## 2018-01-19 NOTE — ED Provider Notes (Signed)
[facility] DEPT Provider Note   CSN: [id number] Arrival date & time: 01/19/18  1213     History   Chief Complaint Chief Complaint  Patient presents with  . Shortness of Breath    HPI Elizabeth Calhoun is a 73 y.o. female.  Patient w hx met lung ca, c/o increased sob in the past few days. Symptoms acute onset, persistent, moderate, occurs at rest, worse w activity. Denies chest pain. Notes increased leg swelling compared to baseline. No focal or unilateral leg pain/swelling. No hx pe/dvt. ?orthopnea. Denies hx asthma/copd. +non prod cough. No sore throat or runny nose. No fever or chills. Compliant w current meds, but says other meds recently discontinued, including hctz.   The history is provided by the patient.  Shortness of Breath  Associated symptoms: cough   Associated symptoms: no abdominal pain, no chest pain, no fever, no headaches, no neck pain, no rash, no sore throat and no vomiting     Past Medical History:  Diagnosis Date  . Adenomatous colon polyp   . Diabetes mellitus without complication ([place])   . Hemorrhoids   . Menopause   . OA (osteoarthritis)   . Obesity   . Other and unspecified hyperlipidemia   . Stroke [facility])     Patient Active Problem List   Diagnosis Date Noted  . Bone metastasis ([place]) 12/16/2017    Past Surgical History:  Procedure Laterality Date  . HYSTERECTOMY ABDOMINAL WITH SALPINGO-OOPHORECTOMY  1990  . KNEE ARTHROSCOPY Left 2009  . SHOULDER SURGERY  2005     OB History   No obstetric history on file.      Home Medications    Prior to Admission medications   Medication Sig Start Date End Date Taking? Authorizing Provider  ASPERCREME LIDOCAINE EX Apply 1 application topically daily as needed (muscle aches).    [provider]  azithromycin (ZITHROMAX) 250 MG tablet Take 1 tablet daily Patient not taking: Reported on 11/28/2017 06/13/17   Isla Pence, MD  benzonatate (TESSALON) 100 MG  capsule Take 1 capsule (100 mg total) by mouth 3 (three) times daily as needed for cough. 11/28/17   Brunetta Genera, MD  clopidogrel (PLAVIX) 75 MG tablet Take 75 mg by mouth daily. 11/23/16   [provider]  dexamethasone (DECADRON) 4 MG tablet 1 tab with breakfast and lunch for 3-4 days then 1 tab with breakfast only 12/10/17   Brunetta Genera, MD  diphenhydrAMINE (BENADRYL) 25 mg capsule Take 25 mg by mouth daily.    [provider]  diphenhydramine-acetaminophen (TYLENOL PM) 25-500 MG TABS tablet Take 1 tablet by mouth at bedtime as needed (sleep).    [provider]  fentaNYL (DURAGESIC - DOSED MCG/HR) 12 MCG/HR Place 1 patch (12.5 mcg total) onto the skin every 3 (three) days. 12/10/17   Brunetta Genera, MD  hydrochlorothiazide (HYDRODIURIL) 25 MG tablet Take 25 mg by mouth daily. 10/09/16   [provider]  HYDROmorphone (DILAUDID) 2 MG tablet Take 1-2 tablets (2-4 mg total) by mouth every 4 (four) hours as needed for severe pain. 12/10/17   Brunetta Genera, MD  insulin lispro (HUMALOG) 100 UNIT/ML injection Inject 10 Units into the skin at bedtime.    [provider]  JARDIANCE 25 MG TABS tablet Take 25 mg by mouth daily. 10/01/16   [provider]  LORazepam (ATIVAN) 0.5 MG tablet Take 1-2 tablets (0.5-1 mg total) by mouth once as needed for up to 1 dose (  30-8mns prior to MRI or CT for anxiety/claustrophobia). Patient not taking: Reported on 01/02/2018 12/23/17   KBrunetta Genera MD  losartan (COZAAR) 25 MG tablet Take 25 mg by mouth daily.    [provider]  metFORMIN (GLUCOPHAGE) 1000 MG tablet Take 1,000 mg by mouth 2 (two) times daily with a meal.    [provider]  ondansetron (ZOFRAN ODT) 4 MG disintegrating tablet Take 1 tablet (4 mg total) by mouth every 8 (eight) hours as needed for nausea or vomiting. 12/07/17   Long, JWonda Olds MD  pioglitazone (ACTOS) 45 MG tablet Take 45 mg by mouth  at bedtime. 10/01/16   [provider]  rosuvastatin (CRESTOR) 40 MG tablet Take 40 mg by mouth daily. 10/01/16   [provider]  zolpidem (AMBIEN) 10 MG tablet Take 10 mg by mouth at bedtime.     [provider]    Family History Family History  Problem Relation Age of Onset  . Heart attack Father   . Heart disease Brother        1/8  . Heart disease Brother        2/8  . Heart disease Brother        3/8  . Heart disease Brother        4/8  . Heart disease Brother        5/8  . Heart disease Brother        6/8  . Cancer Brother        7/8  . Seizures Brother        8/8  . Stroke Brother        8/8  . Heart disease Sister        1/6  . Alzheimer's disease Sister        2/6  . Diabetes Sister        3/6  . Hypertension Sister        3/6  . Hypertension Sister        4/6  . Diabetes Sister        4/6  . Diabetes Daughter     Social History Social History   Tobacco Use  . Smoking status: Never Smoker  . Smokeless tobacco: Never Used  Substance Use Topics  . Alcohol use: Not Currently    Comment: occasionally  . Drug use: No     Allergies   Aspirin   Review of Systems Review of Systems  Constitutional: Negative for chills and fever.  HENT: Negative for sore throat.   Eyes: Negative for redness.  Respiratory: Positive for cough and shortness of breath.   Cardiovascular: Positive for leg swelling. Negative for chest pain.  Gastrointestinal: Negative for abdominal pain and vomiting.  Genitourinary: Negative for flank pain.  Musculoskeletal: Negative for back pain and neck pain.  Skin: Negative for rash.  Neurological: Negative for headaches.  Hematological: Does not bruise/bleed easily.  Psychiatric/Behavioral: Negative for confusion.     Physical Exam Updated Vital Signs BP (!) 150/76 (BP Location: Left Arm)   Pulse (!) 104   Temp 100.3 F (37.9 C) (Oral)   Resp (!) 26   Ht 1.524 m (5')   Wt 65 kg   SpO2 91%    BMI 27.99 kg/m   Physical Exam Vitals signs and nursing note reviewed.  Constitutional:      Appearance: Normal appearance. She is well-developed.  HENT:     Head: Atraumatic.     Nose: Nose  normal.     Mouth/Throat:     Mouth: Mucous membranes are moist.  Eyes:     General: No scleral icterus.    Conjunctiva/sclera: Conjunctivae normal.     Pupils: Pupils are equal, round, and reactive to light.  Neck:     Musculoskeletal: Normal range of motion and neck supple. No neck rigidity or muscular tenderness.     Trachea: No tracheal deviation.  Cardiovascular:     Rate and Rhythm: Regular rhythm. Tachycardia present.     Pulses: Normal pulses.     Heart sounds: Normal heart sounds. No murmur. No friction rub. No gallop.   Pulmonary:     Effort: Pulmonary effort is normal. No respiratory distress.     Breath sounds: Wheezing present.  Abdominal:     General: Bowel sounds are normal. There is no distension.     Palpations: Abdomen is soft.     Tenderness: There is no abdominal tenderness. There is no guarding.  Genitourinary:    Comments: No cva tenderness.  Musculoskeletal:        General: Swelling present.     Right lower leg: No edema.     Left lower leg: No edema.  Skin:    General: Skin is warm and dry.     Findings: No rash.  Neurological:     Mental Status: She is alert.     Comments: Alert, speech normal.   Psychiatric:        Mood and Affect: Mood normal.      ED Treatments / Results  Labs (all labs ordered are listed, but only abnormal results are displayed) Results for orders placed or performed during the hospital encounter of 01/19/18  CBC  Result Value Ref Range   WBC 6.4 4.0 - 10.5 K/uL   RBC 3.74 (L) 3.87 - 5.11 MIL/uL   Hemoglobin 10.8 (L) 12.0 - 15.0 g/dL   HCT 33.4 (L) 36.0 - 46.0 %   MCV 89.3 80.0 - 100.0 fL   MCH 28.9 26.0 - 34.0 pg   MCHC 32.3 30.0 - 36.0 g/dL   RDW 14.5 11.5 - 15.5 %   Platelets 188 150 - 400 K/uL   nRBC 0.0 0.0 - 0.2 %    Comprehensive metabolic panel  Result Value Ref Range   Sodium 134 (L) 135 - 145 mmol/L   Potassium 3.8 3.5 - 5.1 mmol/L   Chloride 101 98 - 111 mmol/L   CO2 23 22 - 32 mmol/L   Glucose, Bld 210 (H) 70 - 99 mg/dL   BUN 12 8 - 23 mg/dL   Creatinine, Ser 0.79 0.44 - 1.00 mg/dL   Calcium 8.1 (L) 8.9 - 10.3 mg/dL   Total Protein 6.2 (L) 6.5 - 8.1 g/dL   Albumin 3.1 (L) 3.5 - 5.0 g/dL   AST 16 15 - 41 U/L   ALT 12 0 - 44 U/L   Alkaline Phosphatase 186 (H) 38 - 126 U/L   Total Bilirubin 0.6 0.3 - 1.2 mg/dL   GFR calc non Af Amer >60 >60 mL/min   GFR calc Af Amer >60 >60 mL/min   Anion gap 10 5 - 15  Brain natriuretic peptide  Result Value Ref Range   B Natriuretic Peptide 49.2 0.0 - 100.0 pg/mL   Dg Chest 2 View  Result Date: 01/19/2018 CLINICAL DATA:  Shortness of breath. EXAM: CHEST - 2 VIEW COMPARISON:  PET scan of December 10, 2017. Radiographs of December 07, 2017. FINDINGS: Stable cardiac  silhouette. Left hilar spiculated abnormality is noted consistent with malignancy as described on PET scan. No pneumothorax is noted. No pleural effusion is noted. Elevated left hemidiaphragm is noted. Right lung is clear. No acute consolidative process is noted. Bony thorax is unremarkable. IMPRESSION: Stable left hilar spiculated density is noted consistent with malignancy as described on PET scan. No significant changes noted compared to prior exam. Electronically Signed   By: Marijo Conception, M.D.   On: 01/19/2018 14:02     EKG EKG Interpretation  Date/Time:  Sunday January 19 2018 12:23:42 EST Ventricular Rate:  113 PR Interval:    QRS Duration: 76 QT Interval:  334 QTC Calculation: 458 R Axis:   -29 Text Interpretation:  Sinus tachycardia Borderline left axis deviation Confirmed by Lajean Saver [phone]) on 01/19/2018 1:50:05 PM   Radiology Dg Chest 2 View  Result Date: 01/19/2018 CLINICAL DATA:  Shortness of breath. EXAM: CHEST - 2 VIEW COMPARISON:  PET scan of December 10, 2017.  Radiographs of December 07, 2017. FINDINGS: Stable cardiac silhouette. Left hilar spiculated abnormality is noted consistent with malignancy as described on PET scan. No pneumothorax is noted. No pleural effusion is noted. Elevated left hemidiaphragm is noted. Right lung is clear. No acute consolidative process is noted. Bony thorax is unremarkable. IMPRESSION: Stable left hilar spiculated density is noted consistent with malignancy as described on PET scan. No significant changes noted compared to prior exam. Electronically Signed   By: Marijo Conception, M.D.   On: 01/19/2018 14:02    Procedures Procedures (including critical care time)  Medications Ordered in ED Medications  albuterol (PROVENTIL) (2.5 MG/3ML) 0.083% nebulizer solution 5 mg (5 mg Nebulization Given 01/19/18 1254)     Initial Impression / Assessment and Plan / ED Course  I have reviewed the triage vital signs and the nursing notes.  Pertinent labs & imaging results that were available during my care of the patient were reviewed by me and considered in my medical decision making (see chart for details).  Iv ns. Labs. Cxr.   Reviewed nursing notes and prior charts for additional history.   cxr reviewed - lung mass, no change.   Given dyspnea, tachycardia, hx ca - will get cta.  Albuterol neb re mild wheezing.  Wheezing improved. Await ct.   Labs reviewed - chem normal.  Ct pending.   Signed out to Dr Sherry Ruffing, check ct when back. If PE, admit. If negative, would tx for possible bronchitis/early pna with po abx as outpt, and close pcp f/u.     Final Clinical Impressions(s) / ED Diagnoses   Final diagnoses:  None    ED Discharge Orders    None       Lajean Saver, MD 01/19/18 [phone]

## 2018-01-19 NOTE — ED Notes (Signed)
Patient transported to CT 

## 2018-01-19 NOTE — Progress Notes (Signed)
[place] for Heparin Indication: pulmonary embolus  Allergies  Allergen Reactions  . Aspirin Other (See Comments)    Causes lethargy    Patient Measurements: Height: 5' (152.4 cm) Weight: 143 lb 4.8 oz (65 kg) IBW/kg (Calculated) : 45.5 Heparin Dosing Weight: 59 kg  Vital Signs: Temp: 100.3 F (37.9 C) (01/12 1220) Temp Source: Oral (01/12 1220) BP: 113/71 (01/12 1730) Pulse Rate: 94 (01/12 1730)  Labs: Recent Labs    01/19/18 1439  HGB 10.8*  HCT 33.4*  PLT 188  CREATININE 0.79    Estimated Creatinine Clearance: 52.7 mL/min (by C-G formula based on SCr of 0.79 mg/dL).   Medical History: Past Medical History:  Diagnosis Date  . Adenomatous colon polyp   . Diabetes mellitus without complication ([place])   . Hemorrhoids   . Menopause   . OA (osteoarthritis)   . Obesity   . Other and unspecified hyperlipidemia   . Stroke [facility])    Assessment: 73 y/o F recently diagnosed with stage IV lung CA admitted with SOB found to have PE.   Goal of Therapy:  Heparin level 0.3-0.7 units/ml Monitor platelets by anticoagulation protocol: Yes   Plan:  Give 4000 units bolus x 1 Start heparin infusion at 1000 units/hr Check anti-Xa level in 8 hours and daily while on heparin Continue to monitor H&H and platelets  Ulice Dash D 01/19/2018,5:46 PM

## 2018-01-19 NOTE — ED Provider Notes (Signed)
4:09 PM Care assumed from Dr. Ashok Cordia.   At time of transfer care, patient is awaiting results of CT PE study.  Plan of care is to admit the patient with blood thinners if a pulmonary blizzard was discovered however if pneumonia is seen and patient is maintaining oxygen saturations, she will likely be stable for discharge home with antibiotics.  5:38 PM Radiology called to report that patient does have segmental pulmonary emboli with evidence of right heart strain.  Per the previous plan, patient will be started on anticoagulation and will be admitted to the hospital.  Hospitalist team called for admission.   Gabino Hagin, Gwenyth Allegra, MD 01/20/18 [phone]

## 2018-01-20 ENCOUNTER — Inpatient Hospital Stay (HOSPITAL_COMMUNITY): Payer: Medicare Other

## 2018-01-20 DIAGNOSIS — C3432 Malignant neoplasm of lower lobe, left bronchus or lung: Secondary | ICD-10-CM

## 2018-01-20 DIAGNOSIS — E1069 Type 1 diabetes mellitus with other specified complication: Secondary | ICD-10-CM

## 2018-01-20 DIAGNOSIS — I361 Nonrheumatic tricuspid (valve) insufficiency: Secondary | ICD-10-CM

## 2018-01-20 LAB — CBC
HCT: 30.9 % — ABNORMAL LOW (ref 36.0–46.0)
Hemoglobin: 9.9 g/dL — ABNORMAL LOW (ref 12.0–15.0)
MCH: 29.6 pg (ref 26.0–34.0)
MCHC: 32 g/dL (ref 30.0–36.0)
MCV: 92.5 fL (ref 80.0–100.0)
Platelets: 174 10*3/uL (ref 150–400)
RBC: 3.34 MIL/uL — ABNORMAL LOW (ref 3.87–5.11)
RDW: 14.7 % (ref 11.5–15.5)
WBC: 5.1 10*3/uL (ref 4.0–10.5)
nRBC: 0 % (ref 0.0–0.2)

## 2018-01-20 LAB — GLUCOSE, CAPILLARY
Glucose-Capillary: 297 mg/dL — ABNORMAL HIGH (ref 70–99)
Glucose-Capillary: 401 mg/dL — ABNORMAL HIGH (ref 70–99)
Glucose-Capillary: 358 mg/dL — ABNORMAL HIGH (ref 70–99)
Glucose-Capillary: 336 mg/dL — ABNORMAL HIGH (ref 70–99)

## 2018-01-20 LAB — BASIC METABOLIC PANEL
Anion gap: 10 (ref 5–15)
BUN: 14 mg/dL (ref 8–23)
CO2: 23 mmol/L (ref 22–32)
Calcium: 7.7 mg/dL — ABNORMAL LOW (ref 8.9–10.3)
Chloride: 103 mmol/L (ref 98–111)
Creatinine, Ser: 0.91 mg/dL (ref 0.44–1.00)
GFR calc non Af Amer: >60 mL/min (ref >60)
Glucose, Bld: 339 mg/dL — ABNORMAL HIGH (ref 70–99)
Potassium: 4.2 mmol/L (ref 3.5–5.1)
Sodium: 136 mmol/L (ref 135–145)

## 2018-01-20 LAB — ECHOCARDIOGRAM COMPLETE
HEIGHTINCHES: 60 in
Weight: 2292.78 oz

## 2018-01-20 LAB — HEPARIN LEVEL (UNFRACTIONATED)
HEPARIN UNFRACTIONATED: 0.71 [IU]/mL — AB (ref 0.30–0.70)
Heparin Unfractionated: 0.91 IU/mL — ABNORMAL HIGH (ref 0.30–0.70)

## 2018-01-20 MED ORDER — INSULIN GLARGINE 100 UNIT/ML ~~LOC~~ SOLN
10.0000 [IU] | Freq: Every day | SUBCUTANEOUS | Status: DC
  Administered 2018-01-20 – 2018-01-21 (×2): 10 [IU] via SUBCUTANEOUS
  Filled 2018-01-20 (×2): qty 0.1

## 2018-01-20 MED ORDER — HEPARIN (PORCINE) 25000 UT/250ML-% IV SOLN: 950.0000 [IU]/h | INTRAVENOUS | Status: DC

## 2018-01-20 MED ORDER — HYDROCHLOROTHIAZIDE 25 MG PO TABS
25.0000 mg | ORAL_TABLET | Freq: Every day | ORAL | Status: DC
  Administered 2018-01-20 – 2018-01-21 (×2): 25 mg via ORAL
  Filled 2018-01-20 (×2): qty 1

## 2018-01-20 MED ORDER — HEPARIN (PORCINE) 25000 UT/250ML-% IV SOLN
800.0000 [IU]/h | INTRAVENOUS | Status: AC
  Administered 2018-01-20 (×2): 800 [IU]/h via INTRAVENOUS
  Filled 2018-01-20: qty 250

## 2018-01-20 MED ORDER — SENNA 8.6 MG PO TABS
2.0000 | ORAL_TABLET | Freq: Once | ORAL | Status: AC
  Administered 2018-01-20: 17.2 mg via ORAL
  Filled 2018-01-20: qty 2

## 2018-01-20 NOTE — Progress Notes (Signed)
PROGRESS NOTE    Elizabeth Calhoun  ZHY:[id number] DOB: 07-05-1945 DOA: 01/19/2018 PCP: Seward Carol, MD    Brief Narrative:  This is a 73 year old female who presents with worsening dyspnea, progressive to the point where she has symptoms with minimal efforts.  No hemoptysis or frank chest pain.  She does have a recent diagnosis of metastatic lung cancer, currently on radiation therapy, lymph node biopsy resulted in adenocarcinoma.  On her initial physical examination temperature 37.9 C, blood pressure 150/76, heart rate 116, respiratory rate 22, oxygen saturation 96% on room air.  Neck with no JVD, lungs clear to auscultation, heart S1-S2 present and rhythmic, abdomen soft nontender, positive lower extremity edema 2+ bilaterally.  Sodium 134, potassium 3.8, chloride 101, bicarb 23, glucose 210, BUN 12, creatinine 0.79, white count 6.4, hemoglobin 10.8, hematocrit 33.4, platelets 188.  Chest x-ray with left hilar spiculated mass, elevated left hemidiaphragm.  Chest CT angiography with acute segmental pulmonary emboli within the right upper lobe and lower lobe.  Evidence of right heart strain.  Unchanged 3.1 cm left apical lung mass, positive left pleural, nodal and osseous metastatic disease.  4.0 cm nodal mass severely narrows the left pulmonary artery and left upper lobe bronchus.   Patient will be admitted to the hospital with a working diagnosis of acute pulmonary embolism.   Assessment & Plan:   Active Problems:   Pulmonary embolism ([place])   1. Acute bilateral pulmonary embolism. Patient with improved dyspnea but not back to baseline, continue to use supplemental 02 per [place] at 3 LPM. Will continue anticoagulation with IV heparin and will follow on echocardiogram. If no RV dysfunction will plan to start patient on oral anticoagulation, rivaroxaban.   2. Metastetic adenocarcinoma of the left lung. Will continue supportive medical therapy. Patient sp radiation therapy and waiting final  oncology recommendations. Continue dexamethasone for now. Continue pain control with fentanyl and hydromorphone.   3. T2Dm with uncontrolled hyperglycemia. Persistent hyperglycemia likely exacerbated by systemic steroids, will add basal insulin with glargine 10 units and will continue insulin sliding scale for glucose cover and monitoring.   4. HTN. Blood pressure has remained stable, will resume hydrochlorothiazide per home regimen.   5. Dyslipidemia. Continue statin therapy with rosuvastatin.    DVT prophylaxis: IV heparin   Code Status: full Family Communication: I spoke with patient's family at the bedside and all questions were addressed.  Disposition Plan/ discharge barriers: possible dc in am pending echocardiogram.   Body mass index is 27.99 kg/m. Malnutrition Type:      Malnutrition Characteristics:      Nutrition Interventions:     RN Pressure Injury Documentation:     Consultants:     Procedures:     Antimicrobials:      Subjective: Patient reports improvement in dyspnea, not back to baseline yet, her lower extremity have improved, no chest pain. Still remains on supplemental 02 per [place]. Positive dysphagia.   Objective: Vitals:   01/19/18 1700 01/19/18 1730 01/19/18 1946 01/20/18 0450  BP: 117/70 113/71 129/73 117/74  Pulse: 93 94 90 85  Resp: (!) 29  (!) 22 18  Temp:   99.8 F (37.7 C) 98.2 F (36.8 C)  TempSrc:   Oral   SpO2: 97% 95% 97% 97%  Weight:      Height:        Intake/Output Summary (Last 24 hours) at 01/20/2018 1137 Last data filed at 01/20/2018 0600 Gross per 24 hour  Intake 217.26 ml  Output -  Net  217.26 ml   Filed Weights   01/19/18 1221  Weight: 65 kg    Examination:   General: deconditioned  Neurology: Awake and alert, non focal  E ENT: mild pallor, no icterus, oral mucosa moist Cardiovascular: No JVD. S1-S2 present, rhythmic, no gallops, rubs, or murmurs. No lower extremity edema. Pulmonary: positive breath  sounds bilaterally, no wheezing, scattered rhonchi but rales. Gastrointestinal. Abdomen with no organomegaly, non tender, no rebound or guarding Skin. No rashes Musculoskeletal: no joint deformities     Data Reviewed: I have personally reviewed following labs and imaging studies  CBC: Recent Labs  Lab 01/19/18 1439 01/20/18 0500  WBC 6.4 5.1  HGB 10.8* 9.9*  HCT 33.4* 30.9*  MCV 89.3 92.5  PLT 188 016   Basic Metabolic Panel: Recent Labs  Lab 01/19/18 1439 01/20/18 0500  NA 134* 136  K 3.8 4.2  CL 101 103  CO2 23 23  GLUCOSE 210* 339*  BUN 12 14  CREATININE 0.79 0.91  CALCIUM 8.1* 7.7*   GFR: Estimated Creatinine Clearance: 46.3 mL/min (by C-G formula based on SCr of 0.91 mg/dL). Liver Function Tests: Recent Labs  Lab 01/19/18 1439  AST 16  ALT 12  ALKPHOS 186*  BILITOT 0.6  PROT 6.2*  ALBUMIN 3.1*   No results for input(s): LIPASE, AMYLASE in the last 168 hours. No results for input(s): AMMONIA in the last 168 hours. Coagulation Profile: Recent Labs  Lab 01/19/18 1807  INR 1.17   Cardiac Enzymes: No results for input(s): CKTOTAL, CKMB, CKMBINDEX, TROPONINI in the last 168 hours. BNP (last 3 results) No results for input(s): PROBNP in the last 8760 hours. HbA1C: No results for input(s): HGBA1C in the last 72 hours. CBG: Recent Labs  Lab 01/19/18 1948  GLUCAP 212*   Lipid Profile: No results for input(s): CHOL, HDL, LDLCALC, TRIG, CHOLHDL, LDLDIRECT in the last 72 hours. Thyroid Function Tests: No results for input(s): TSH, T4TOTAL, FREET4, T3FREE, THYROIDAB in the last 72 hours. Anemia Panel: No results for input(s): VITAMINB12, FOLATE, FERRITIN, TIBC, IRON, RETICCTPCT in the last 72 hours.    Radiology Studies: I have reviewed all of the imaging during this hospital visit personally     Scheduled Meds: . dexamethasone  4 mg Oral Daily  . fentaNYL  12.5 mcg Transdermal Q72H  . insulin aspart  0-9 Units Subcutaneous TID WC  .  rosuvastatin  40 mg Oral Daily  . sodium chloride flush  3 mL Intravenous Q12H  . zolpidem  5 mg Oral QHS   Continuous Infusions: . sodium chloride    . heparin 950 Units/hr (01/20/18 0620)     LOS: 1 day        Tawni Millers, MD [facility] Pager [phone]

## 2018-01-20 NOTE — Progress Notes (Signed)
Pharmacy: Re- heparin  Patient's a 73 y.o F with lung cancer currently on heparin drip for acute PE with evidence of right heart strain. First heparin level is slightly supra-therapeutic at 0.71 (0.3-0.7). Per RN, no issues with IV line or bleeding noted.  - Hgb down slightly to 9.9, plts ok  Plan: - decrease heparin drip to 950 units/hr - check 8 hr heparin level - monitor for s/s bleeding  Dia Sitter, PharmD, BCPS 01/20/2018 5:28 AM

## 2018-01-20 NOTE — Progress Notes (Signed)
[place] for Heparin Indication: pulmonary embolism  Allergies  Allergen Reactions  . Aspirin Other (See Comments)    Causes lethargy    Patient Measurements: Height: 5' (152.4 cm) Weight: 143 lb 4.8 oz (65 kg) IBW/kg (Calculated) : 45.5 Heparin Dosing Weight: 59 kg  Vital Signs: Temp: 98.2 F (36.8 C) (01/13 0450) BP: 117/74 (01/13 0450) Pulse Rate: 85 (01/13 0450)  Labs: Recent Labs    01/19/18 1439 01/19/18 1807 01/20/18 0500 01/20/18 1355  HGB 10.8*  --  9.9*  --   HCT 33.4*  --  30.9*  --   PLT 188  --  174  --   APTT  --  29  --   --   LABPROT  --  14.8  --   --   INR  --  1.17  --   --   HEPARINUNFRC  --   --  0.71* 0.91*  CREATININE 0.79  --  0.91  --     Estimated Creatinine Clearance: 46.3 mL/min (by C-G formula based on SCr of 0.91 mg/dL).   Medical History: Past Medical History:  Diagnosis Date  . Adenomatous colon polyp   . Diabetes mellitus without complication ([place])   . Hemorrhoids   . Menopause   . OA (osteoarthritis)   . Obesity   . Other and unspecified hyperlipidemia   . Stroke [facility])    Assessment: 73 y/o F recently diagnosed with stage IV lung CA admitted with SOB, found to have acute PE. Pharmacy consulted to dose heparin infusion. Patient on Plavix PTA for history of stroke, held on admission due to risk of bleeding.   Today, 01/20/18:   1355 heparin level = 0.91 units/mL, supratherapeutic despite decrease in heparin infusion rate to 950 units/hr earlier this morning  CBC: Hgb decreased to 9.9, Pltc WNL  No bleeding or infusion issues noted per nursing  Goal of Therapy:  Heparin level 0.3-0.7 units/ml Monitor platelets by anticoagulation protocol: Yes   Plan:   Decrease heparin infusion to 800 units/hr  Heparin level 8 hours after rate change  Daily CBC and heparin level  Monitor closely for s/sx of bleeding  F/u long-term anticoagulation plan    Lindell Spar, PharmD,  BCPS Pager: [phone] 01/20/2018 2:42 PM

## 2018-01-20 NOTE — Progress Notes (Signed)
  Echocardiogram 2D Echocardiogram has been performed.  Elizabeth Calhoun 01/20/2018, 4:07 PM

## 2018-01-20 NOTE — Progress Notes (Signed)
HS blood glucose 336, pt has no HS coverage ordered. Message sent to Dr. Hilbert Bible, no new orders received.

## 2018-01-21 ENCOUNTER — Encounter (HOSPITAL_COMMUNITY): Payer: Self-pay | Admitting: Hematology

## 2018-01-21 LAB — BASIC METABOLIC PANEL
Anion gap: 11 (ref 5–15)
BUN: 19 mg/dL (ref 8–23)
CO2: 22 mmol/L (ref 22–32)
Calcium: 8 mg/dL — ABNORMAL LOW (ref 8.9–10.3)
Chloride: 101 mmol/L (ref 98–111)
Creatinine, Ser: 0.76 mg/dL (ref 0.44–1.00)
GFR calc Af Amer: >60 mL/min (ref >60)
GFR calc non Af Amer: >60 mL/min (ref >60)
Glucose, Bld: 268 mg/dL — ABNORMAL HIGH (ref 70–99)
Potassium: 4.1 mmol/L (ref 3.5–5.1)
Sodium: 134 mmol/L — ABNORMAL LOW (ref 135–145)

## 2018-01-21 LAB — CBC
HCT: 29.5 % — ABNORMAL LOW (ref 36.0–46.0)
Hemoglobin: 9.3 g/dL — ABNORMAL LOW (ref 12.0–15.0)
MCH: 29.6 pg (ref 26.0–34.0)
MCHC: 31.5 g/dL (ref 30.0–36.0)
MCV: 93.9 fL (ref 80.0–100.0)
Platelets: 197 10*3/uL (ref 150–400)
RBC: 3.14 MIL/uL — ABNORMAL LOW (ref 3.87–5.11)
RDW: 14.4 % (ref 11.5–15.5)
WBC: 5.5 10*3/uL (ref 4.0–10.5)
nRBC: 0 % (ref 0.0–0.2)

## 2018-01-21 LAB — HEPARIN LEVEL (UNFRACTIONATED)
HEPARIN UNFRACTIONATED: 0.46 [IU]/mL (ref 0.30–0.70)
Heparin Unfractionated: 0.65 IU/mL (ref 0.30–0.70)

## 2018-01-21 LAB — GLUCOSE, CAPILLARY
Glucose-Capillary: 202 mg/dL — ABNORMAL HIGH (ref 70–99)
Glucose-Capillary: 322 mg/dL — ABNORMAL HIGH (ref 70–99)

## 2018-01-21 MED ORDER — SORBITOL 70 % SOLN: 30.0000 mL | Freq: Every day | 0 refills | Status: DC | PRN

## 2018-01-21 MED ORDER — RIVAROXABAN (XARELTO) VTE STARTER PACK (15 & 20 MG): ORAL_TABLET | ORAL | 0 refills | Status: DC

## 2018-01-21 MED ORDER — POLYETHYLENE GLYCOL 3350 17 G PO PACK: 17.0000 g | PACK | Freq: Every day | ORAL | 0 refills | Status: DC

## 2018-01-21 MED ORDER — POLYETHYLENE GLYCOL 3350 17 G PO PACK
17.0000 g | PACK | Freq: Every day | ORAL | Status: DC
  Administered 2018-01-21: 17 g via ORAL
  Filled 2018-01-21: qty 1

## 2018-01-21 MED ORDER — RIVAROXABAN 20 MG PO TABS: 20.0000 mg | ORAL_TABLET | Freq: Every day | ORAL | Status: DC

## 2018-01-21 MED ORDER — RIVAROXABAN 15 MG PO TABS
15.0000 mg | ORAL_TABLET | Freq: Two times a day (BID) | ORAL | Status: DC
  Administered 2018-01-21: 15 mg via ORAL
  Filled 2018-01-21: qty 1

## 2018-01-21 MED ORDER — SORBITOL 70 % SOLN: 30.0000 mL | Freq: Every day | Status: DC | PRN

## 2018-01-21 NOTE — Progress Notes (Signed)
SATURATION QUALIFICATIONS: (This note is used to comply with regulatory documentation for home oxygen)  Patient Saturations on Room Air at Rest = 98%  Patient Saturations on Room Air while Ambulating = 96%   Please briefly explain why patient needs home oxygen:  Patient never dropped below 96% while ambulating.

## 2018-01-21 NOTE — Progress Notes (Signed)
ANTICOAGULATION CONSULT NOTE - Follow Up Consult  Pharmacy Consult for Heparin Indication: pulmonary embolus  Allergies  Allergen Reactions  . Aspirin Other (See Comments)    Causes lethargy    Patient Measurements: Height: 5' (152.4 cm) Weight: 143 lb 4.8 oz (65 kg) IBW/kg (Calculated) : 45.5 Heparin Dosing Weight:   Vital Signs: Temp: 98.3 F (36.8 C) (01/14 0503) Temp Source: Oral (01/14 0503) BP: 131/73 (01/14 0503) Pulse Rate: 85 (01/14 0503)  Labs: Recent Labs    01/19/18 1439 01/19/18 1807 01/20/18 0500 01/20/18 1355 01/20/18 2234 01/21/18 0311  HGB 10.8*  --  9.9*  --   --  9.3*  HCT 33.4*  --  30.9*  --   --  29.5*  PLT 188  --  174  --   --  197  APTT  --  29  --   --   --   --   LABPROT  --  14.8  --   --   --   --   INR  --  1.17  --   --   --   --   HEPARINUNFRC  --   --  0.71* 0.91* 0.65  --   CREATININE 0.79  --  0.91  --   --  0.76    Estimated Creatinine Clearance: 52.7 mL/min (by C-G formula based on SCr of 0.76 mg/dL).   Medications:  Infusions:  . sodium chloride    . heparin 800 Units/hr (01/20/18 2219)    Assessment: Patient with heparin level at goal.  No heparin issues noted.  Goal of Therapy:  Heparin level 0.3-0.7 units/ml Monitor platelets by anticoagulation protocol: Yes   Plan:  Continue heparin drip at current rate Recheck level with AM labs  Nani Skillern Crowford 01/21/2018,5:04 AM

## 2018-01-21 NOTE — Progress Notes (Signed)
[place] for Heparin --> Xarelto  Indication: pulmonary embolism  Allergies  Allergen Reactions  . Aspirin Other (See Comments)    Causes lethargy    Patient Measurements: Height: 5' (152.4 cm) Weight: 143 lb 4.8 oz (65 kg) IBW/kg (Calculated) : 45.5 Heparin Dosing Weight: 59 kg  Vital Signs: Temp: 98.3 F (36.8 C) (01/14 0503) Temp Source: Oral (01/14 0503) BP: 131/73 (01/14 0503) Pulse Rate: 85 (01/14 0503)  Labs: Recent Labs    01/19/18 1439 01/19/18 1807  01/20/18 0500 01/20/18 1355 01/20/18 2234 01/21/18 0311 01/21/18 0803  HGB 10.8*  --   --  9.9*  --   --  9.3*  --   HCT 33.4*  --   --  30.9*  --   --  29.5*  --   PLT 188  --   --  174  --   --  197  --   APTT  --  29  --   --   --   --   --   --   LABPROT  --  14.8  --   --   --   --   --   --   INR  --  1.17  --   --   --   --   --   --   HEPARINUNFRC  --   --    < > 0.71* 0.91* 0.65  --  0.46  CREATININE 0.79  --   --  0.91  --   --  0.76  --    < > = values in this interval not displayed.    Estimated Creatinine Clearance: 52.7 mL/min (by C-G formula based on SCr of 0.76 mg/dL).   Medical History: Past Medical History:  Diagnosis Date  . Adenomatous colon polyp   . Diabetes mellitus without complication ([place])   . Hemorrhoids   . Menopause   . OA (osteoarthritis)   . Obesity   . Other and unspecified hyperlipidemia   . Stroke [facility])    Assessment: 73 y/o F recently diagnosed with stage IV lung CA admitted with SOB, found to have acute PE. Pharmacy consulted to dose heparin infusion. Patient on Plavix PTA for history of stroke, held on admission due to risk of bleeding.   Today, 01/21/18:   0803 heparin level = 0.46 units/mL, remains therapeutic on heparin infusion at 800 units/hr  CBC: Hgb decreased to 9.3, Pltc WNL  RN reports that when lab came to draw heparin level this AM ~ 0800, she noted that IV had just come out and she had to apply pressure to  the site, as it was oozing. Per RN, she called IV team to place new IV site; heparin resumed at 0930.    Goal of Therapy:  Heparin level 0.3-0.7 units/ml Monitor platelets by anticoagulation protocol: Yes   Plan:   Continue heparin infusion at 800 units/hr  Check heparin level 8 hours after heparin resumed to ensure level remains within therapeutic range  Daily CBC and heparin level  Monitor closely for s/sx of bleeding  F/u long-term anticoagulation plan    Lindell Spar, PharmD, BCPS Pager: [phone] 01/21/2018 8:55 AM   Addendum:  Pharmacy consulted to transition patient to Rivaroxaban.   SCr 0.76 with CrCl > 30 ml/min    Plan:  Stop heparin infusion now  Start Xarelto 15mg  PO BID with meals x 21 days, then 20mg  PO daily with supper  Monitor CBC, renal  function, and for s/sx of bleeding  Pharmacy to provide education and 30-day free card to patient prior to discharge.    Lindell Spar, PharmD, BCPS Pager: [phone] 01/21/2018 10:52 AM

## 2018-01-21 NOTE — Discharge Summary (Addendum)
Physician Discharge Summary  Elizabeth Calhoun ZHY:[id number] DOB: [birth year] DOA: 01/19/2018  PCP: Seward Carol, MD  Admit date: 01/19/2018 Discharge date: 01/21/2018  Admitted From: Home  Disposition:  Home   Recommendations for Outpatient Follow-up and new medication changes:  1. Follow up with Dr. Delfina Redwood in 7 days.  2. Patient has been placed on Rivaroxaban for anticoagulation. 3. Added miralax for bowel regimen.  4. Follow up with Dr. [name] as scheduled.  5. Discontinued clopidogrel due to increased bleeding risk.   Home Health: no   Equipment/Devices: no    Discharge Condition: stable  CODE STATUS: full  Diet recommendation: heart healthy and diabetic prudent.   Brief/Interim Summary: This is a 73 year old female who presents with worsening dyspnea, progressive to the point where she had symptoms with minimal efforts. No hemoptysis or frank chest pain. She does have a recent diagnosis of metastatic lung cancer, currently on radiation therapy, lymph node biopsy resulted in adenocarcinoma. On her initial physical examination temperature 37.9 C, blood pressure 150/76, heart rate 116, respiratory rate 22, oxygen saturation 96% on room air.Neck with no JVD, lungs clear to auscultation, heart S1-S2 present and rhythmic, abdomen soft nontender, positive lower extremity edema 2+ bilaterally. Sodium 134, potassium 3.8, chloride 101, bicarb 23, glucose 210, BUN 12, creatinine 0.79, white count 6.4, hemoglobin 10.8, hematocrit 33.4, platelets 188.Chest x-ray with left hilar spiculated mass, elevated left hemidiaphragm. Chest CT angiography with acute segmental pulmonary emboli within the right upper lobe and lower lobe. Evidence of right heart strain. Unchanged 3.1 cm left apical lung mass,positive left pleural, nodal and osseousmetastatic disease. 4.0 cm nodal mass severely narrows the left pulmonary artery and left upper lobe bronchus.  Patient was be admitted to the hospital  with a working diagnosis of acute pulmonary embolism.  1.  Acute segmental pulmonary emboli within the right upper lobe and lower lobe, provoked, malignancy related.  Patient was admitted to the medical ward, she was placed on a remote telemetry monitor, and started on IV unfractionated heparin for anticoagulation.  She remained hemodynamically stable, further work-up with echocardiography showed left ventricle ejection fraction 60 to 65%, the peak PA pressure was 34.  Patient was successfully transitioned to oral anticoagulants with rivaroxaban, with no major complications.  To prevent bleeding clopidogrel has been discontinued.  2.  Metastatic adenocarcinoma of the left lung.  Patient was continued on opiate analgesics, bowel regimen was added, oximetry on ambulation on room air did not show significant desaturation.  Patient was noncompliant with dexamethasone, she has completed her radiation therapy, at this point avoid further hypoglycemia will discontinue systemic steroids.  She will follow-up with Dr. [name] as an outpatient in the outpatient oncology office.  3.  Type 2 diabetes mellitus with uncontrolled hyperglycemia.  Patient had persistent hyperglycemia, suspected to be steroid related, she did receive insulin coverage while hospitalized, at discharge dexamethasone will be discontinued.  4.  Hypertension.  Patient has remained hemodynamically stable hydrochlorothiazide and losartan will be resumed at discharge.  5.  Dyslipidemia.  Continue rosuvastatin.   Discharge Diagnoses:  Active Problems:   Pulmonary embolism [facility])    Discharge Instructions   Allergies as of 01/21/2018      Reactions   Aspirin Other (See Comments)   Causes lethargy      Medication List    STOP taking these medications   azithromycin 250 MG tablet Commonly known as:  ZITHROMAX   clopidogrel 75 MG tablet Commonly known as:  PLAVIX   dexamethasone 4 MG tablet Commonly  known as:  DECADRON   LORazepam  0.5 MG tablet Commonly known as:  ATIVAN     TAKE these medications   ASPERCREME LIDOCAINE EX Apply 1 application topically daily as needed (muscle aches).   benzonatate 100 MG capsule Commonly known as:  TESSALON Take 1 capsule (100 mg total) by mouth 3 (three) times daily as needed for cough.   diphenhydrAMINE 25 mg capsule Commonly known as:  BENADRYL Take 25 mg by mouth daily.   fentaNYL 12 MCG/HR Commonly known as:  DURAGESIC Place 1 patch (12.5 mcg total) onto the skin every 3 (three) days.   hydrochlorothiazide 25 MG tablet Commonly known as:  HYDRODIURIL Take 25 mg by mouth daily.   HYDROmorphone 2 MG tablet Commonly known as:  DILAUDID Take 1-2 tablets (2-4 mg total) by mouth every 4 (four) hours as needed for severe pain.   insulin lispro 100 UNIT/ML injection Commonly known as:  HUMALOG Inject 10 Units into the skin at bedtime.   losartan 25 MG tablet Commonly known as:  COZAAR Take 25 mg by mouth daily.   ondansetron 4 MG disintegrating tablet Commonly known as:  ZOFRAN ODT Take 1 tablet (4 mg total) by mouth every 8 (eight) hours as needed for nausea or vomiting.   polyethylene glycol packet Commonly known as:  MIRALAX / GLYCOLAX Take 17 g by mouth daily for 30 days.   Rivaroxaban 15 & 20 MG Tbpk Take as directed on package: Start with one 15mg  tablet by mouth twice a day with food. On Day 22, switch to one 20mg  tablet once a day with food.   rosuvastatin 40 MG tablet Commonly known as:  CRESTOR Take 40 mg by mouth daily.   sorbitol 70 % Soln Take 30 mLs by mouth daily as needed for severe constipation.   zolpidem 10 MG tablet Commonly known as:  AMBIEN Take 10 mg by mouth at bedtime.       Allergies  Allergen Reactions  . Aspirin Other (See Comments)    Causes lethargy    Consultations:  Dr. [name] was informed about patient's new PE.     Procedures/Studies: Dg Chest 2 View  Result Date: 01/19/2018 CLINICAL DATA:  Shortness of  breath. EXAM: CHEST - 2 VIEW COMPARISON:  PET scan of December 10, 2017. Radiographs of December 07, 2017. FINDINGS: Stable cardiac silhouette. Left hilar spiculated abnormality is noted consistent with malignancy as described on PET scan. No pneumothorax is noted. No pleural effusion is noted. Elevated left hemidiaphragm is noted. Right lung is clear. No acute consolidative process is noted. Bony thorax is unremarkable. IMPRESSION: Stable left hilar spiculated density is noted consistent with malignancy as described on PET scan. No significant changes noted compared to prior exam. Electronically Signed   By: Marijo Conception, M.D.   On: 01/19/2018 14:02   Ct Angio Chest Pe W/cm &/or Wo Cm  Result Date: 01/19/2018 CLINICAL DATA:  Worsening shortness of breath over the past few days. Recently diagnosed with metastatic lung cancer. EXAM: CT ANGIOGRAPHY CHEST WITH CONTRAST TECHNIQUE: Multidetector CT imaging of the chest was performed using the standard protocol during bolus administration of intravenous contrast. Multiplanar CT image reconstructions and MIPs were obtained to evaluate the vascular anatomy. CONTRAST:  73mL ISOVUE-370 IOPAMIDOL (ISOVUE-370) INJECTION 76% COMPARISON:  PET-CT dated December 10, 2017. CT chest dated December 07, 2017. FINDINGS: Cardiovascular: Acute segmental pulmonary emboli within the right upper and lower lobes. No central or lobar pulmonary emboli. Elevated RV/LV ratio of 1.6.  No pericardial effusion. No thoracic aortic aneurysm or dissection. Coronary, aortic arch, and branch vessel atherosclerotic vascular disease. Mediastinum/Nodes: Unchanged aortopulmonary window nodal mass measuring 3.8 x 4.0 cm, this again completely encases the left pulmonary artery and left upper lobe bronchus. Other scattered mediastinal lymph nodes measuring up to 1 cm in short axis are unchanged. Stable to slightly decreased left hilar lymph node measuring 1.3 cm, previously 1.5 cm. No axillary right hilar  lymphadenopathy. The thyroid gland, trachea, and esophagus demonstrate no significant findings. Lungs/Pleura: Unchanged 3.1 cm mass in the left apical of lobe. Additional scattered nodules in the left upper lobe are also unchanged. Stable multifocal pleural nodularity in the left hemithorax. No pleural effusion or pneumothorax. Upper Abdomen: No acute abnormality. Musculoskeletal: Unchanged lytic metastases involving the left scapula, left T2 pedicle and transverse process, and T11 vertebral body. Review of the MIP images confirms the above findings. IMPRESSION: 1. Acute segmental pulmonary emboli within the right upper and lower lobes. Evidence of right heart strain. 2. Unchanged 3.1 cm left apical lung mass with left upper lobe, left pleural, nodal, and osseous metastatic disease. The 4.0 cm aortopulmonary window nodal mass severely narrows the left pulmonary artery and left upper lobe bronchus. 3. Aortic atherosclerosis (ICD10-I70.0). Critical Value/emergent results were called by telephone at the time of interpretation on 01/19/2018 at 5:17 pm to Dr. Sherry Ruffing, who verbally acknowledged these results. Electronically Signed   By: Titus Dubin M.D.   On: 01/19/2018 17:26   Mr Jeri Cos GH Contrast  Result Date: 12/26/2017 CLINICAL DATA:  73 y/o  F; non-small cell lung cancer for staging. EXAM: MRI HEAD WITHOUT AND WITH CONTRAST TECHNIQUE: Multiplanar, multiecho pulse sequences of the brain and surrounding structures were obtained without and with intravenous contrast. CONTRAST:  6 cc Gadavist. COMPARISON:  03/12/2015 MRI head.  12/10/2017 PET-CT. FINDINGS: Brain: No acute infarction, hemorrhage, hydrocephalus, extra-axial collection or mass effect. Small chronic infarcts of left paramedian pontomedullary junction, left caudate body, right inferomedial cerebellar hemisphere, right lateral temporal lobe, and bifrontal periventricular white matter. Progression of nonspecific T2 FLAIR hyperintensities in  subcortical and periventricular white matter are compatible with advanced chronic microvascular ischemic changes for age. Stable moderate volume loss of the brain. Round 5 mm focus of enhancement within the right anterior basal ganglia (series 12, image 36). Vascular: Normal flow voids. Skull and upper cervical spine: Few small T1 hyperintense hemangiomata in the calvarium. No enhancing lesion identified. Sinuses/Orbits: Negative. Other: None. IMPRESSION: 1. Single 5 mm focus of enhancement within right anterior basal ganglia, likely metastasis. 2. Progression of advanced chronic microvascular ischemic changes in stable moderate volume loss of the brain from 2017. Multiple stable chronic infarcts as above. Electronically Signed   By: Kristine Garbe M.D.   On: 12/26/2017 21:03   [place] Core Biopsy (lymph Nodes)  Result Date: 12/31/2017 INDICATION: Concern for metastatic lung cancer now with hypermetabolic left supraclavicular lymphadenopathy. Please perform ultrasound-guided cervical lymph node biopsy for tissue diagnostic purposes. EXAM: ULTRASOUND-GUIDED LEFT SUPRACLAVICULAR LYMPH NODE BIOPSY COMPARISON:  PET CT - 12/11/2018 MEDICATIONS: None ANESTHESIA/SEDATION: Moderate (conscious) sedation was employed during this procedure. A total of Versed 2 mg and Fentanyl 100 mcg was administered intravenously. Moderate Sedation Time: 16 minutes. The patient's level of consciousness and vital signs were monitored continuously by radiology nursing throughout the procedure under my direct supervision. COMPLICATIONS: None immediate. TECHNIQUE: Informed written consent was obtained from the patient after a discussion of the risks, benefits and alternatives to treatment. Questions regarding the procedure were encouraged  and answered. Initial ultrasound scanning demonstrated grossly unchanged size and appearance of hypermetabolic left supraclavicular lymph node measuring approximately 1.2 cm in greatest diameter (image  4), correlating with the hypermetabolic left supraclavicular lymph node seen on preceding PET-CT image 63, series 4. an ultrasound image was saved for documentation purposes. The procedure was planned. A timeout was performed prior to the initiation of the procedure. The operative was prepped and draped in the usual sterile fashion, and a sterile drape was applied covering the operative field. A timeout was performed prior to the initiation of the procedure. Local anesthesia was provided with 1% lidocaine with epinephrine. Under direct ultrasound guidance, an 18 gauge core needle device was utilized to obtain to obtain 7 core needle biopsies of the hypermetabolic left supraclavicular lymph node. The samples were placed in saline and submitted to pathology. The needle was removed and hemostasis was achieved with manual compression. Post procedure scan was negative for significant hematoma. A dressing was placed. The patient tolerated the procedure well without immediate postprocedural complication. IMPRESSION: Technically successful ultrasound guided biopsy of hypermetabolic left supraclavicular lymph node. Electronically Signed   By: Sandi Mariscal M.D.   On: 12/31/2017 17:07       Subjective: Patient is feeling better, no nausea or vomiting, dyspnea has improved and lower extremity continue to improve.   Discharge Exam: Vitals:   01/20/18 2117 01/21/18 0503  BP: 127/75 131/73  Pulse: 87 85  Resp: 18 18  Temp: 98.7 F (37.1 C) 98.3 F (36.8 C)  SpO2: 94% (!) 85%   Vitals:   01/20/18 0450 01/20/18 1500 01/20/18 2117 01/21/18 0503  BP: 117/74 121/68 127/75 131/73  Pulse: 85 79 87 85  Resp: 18 18 18 18   Temp: 98.2 F (36.8 C) 98.6 F (37 C) 98.7 F (37.1 C) 98.3 F (36.8 C)  TempSrc:  Oral Oral Oral  SpO2: 97% 99% 94% (!) 85%  Weight:      Height:        General: Not in pain or dyspnea. Deconditioned  Neurology: Awake and alert, non focal  E ENT: no pallor, no icterus, oral mucosa  moist Cardiovascular: No JVD. S1-S2 present, rhythmic, no gallops, rubs, or murmurs. No lower extremity edema. Pulmonary: Positive breath sounds bilaterally, adequate air movement, no wheezing, scattered rhonchi, but no rales. Gastrointestinal. Abdomen with no organomegaly, non tender, no rebound or guarding Skin. No rashes Musculoskeletal: no joint deformities   The results of significant diagnostics from this hospitalization (including imaging, microbiology, ancillary and laboratory) are listed below for reference.     Microbiology: No results found for this or any previous visit (from the past 240 hour(s)).   Labs: BNP (last 3 results) Recent Labs    01/19/18 1439  BNP 81.0   Basic Metabolic Panel: Recent Labs  Lab 01/19/18 1439 01/20/18 0500 01/21/18 0311  NA 134* 136 134*  K 3.8 4.2 4.1  CL 101 103 101  CO2 23 23 22   GLUCOSE 210* 339* 268*  BUN 12 14 19   CREATININE 0.79 0.91 0.76  CALCIUM 8.1* 7.7* 8.0*   Liver Function Tests: Recent Labs  Lab 01/19/18 1439  AST 16  ALT 12  ALKPHOS 186*  BILITOT 0.6  PROT 6.2*  ALBUMIN 3.1*   No results for input(s): LIPASE, AMYLASE in the last 168 hours. No results for input(s): AMMONIA in the last 168 hours. CBC: Recent Labs  Lab 01/19/18 1439 01/20/18 0500 01/21/18 0311  WBC 6.4 5.1 5.5  HGB 10.8* 9.9* 9.3*  HCT 33.4* 30.9* 29.5*  MCV 89.3 92.5 93.9  PLT 188 174 197   Cardiac Enzymes: No results for input(s): CKTOTAL, CKMB, CKMBINDEX, TROPONINI in the last 168 hours. BNP: Invalid input(s): POCBNP CBG: Recent Labs  Lab 01/20/18 0717 01/20/18 1200 01/20/18 1647 01/20/18 2117 01/21/18 0805  GLUCAP 297* 401* 358* 336* 202*   D-Dimer No results for input(s): DDIMER in the last 72 hours. Hgb A1c No results for input(s): HGBA1C in the last 72 hours. Lipid Profile No results for input(s): CHOL, HDL, LDLCALC, TRIG, CHOLHDL, LDLDIRECT in the last 72 hours. Thyroid function studies No results for  input(s): TSH, T4TOTAL, T3FREE, THYROIDAB in the last 72 hours.  Invalid input(s): FREET3 Anemia work up No results for input(s): VITAMINB12, FOLATE, FERRITIN, TIBC, IRON, RETICCTPCT in the last 72 hours. Urinalysis    Component Value Date/Time   COLORURINE YELLOW 06/12/2017 2043   APPEARANCEUR CLEAR 06/12/2017 2043   LABSPEC 1.018 06/12/2017 2043   PHURINE 5.0 06/12/2017 2043   GLUCOSEU >=500 (A) 06/12/2017 2043   HGBUR NEGATIVE 06/12/2017 2043   BILIRUBINUR NEGATIVE 06/12/2017 2043   KETONESUR 5 (A) 06/12/2017 2043   PROTEINUR NEGATIVE 06/12/2017 2043   UROBILINOGEN 0.2 01/10/2010 1749   NITRITE NEGATIVE 06/12/2017 2043   LEUKOCYTESUR NEGATIVE 06/12/2017 2043   Sepsis Labs Invalid input(s): PROCALCITONIN,  WBC,  LACTICIDVEN Microbiology No results found for this or any previous visit (from the past 240 hour(s)).   Time coordinating discharge: 45 minutes  SIGNED:   Tawni Millers, MD  [facility] 01/21/2018, 11:27 AM Pager [phone]  If 7PM-7AM, please contact night-coverage [email or web address] Password [email or web address]

## 2018-01-21 NOTE — Discharge Instructions (Signed)
Information on my medicine - XARELTO (rivaroxaban)  This medication education was reviewed with me or my healthcare representative as part of my discharge preparation.  The pharmacist that spoke with me during my hospital stay was:  Josefine Class, Venturia? Xarelto was prescribed to treat blood clots that may have been found in the veins of your legs (deep vein thrombosis) or in your lungs (pulmonary embolism) and to reduce the risk of them occurring again.  What do you need to know about Xarelto? The starting dose is one 15 mg tablet taken TWICE daily with food for the FIRST 21 DAYS then on (enter date)  02/11/2018  the dose is changed to one 20 mg tablet taken ONCE A DAY with your evening meal.  DO NOT stop taking Xarelto without talking to the health care provider who prescribed the medication.  Refill your prescription for 20 mg tablets before you run out.  After discharge, you should have regular check-up appointments with your healthcare provider that is prescribing your Xarelto.  In the future your dose may need to be changed if your kidney function changes by a significant amount.  What do you do if you miss a dose? If you are taking Xarelto TWICE DAILY and you miss a dose, take it as soon as you remember. You may take two 15 mg tablets (total 30 mg) at the same time then resume your regularly scheduled 15 mg twice daily the next day.  If you are taking Xarelto ONCE DAILY and you miss a dose, take it as soon as you remember on the same day then continue your regularly scheduled once daily regimen the next day. Do not take two doses of Xarelto at the same time.   Important Safety Information Xarelto is a blood thinner medicine that can cause bleeding. You should call your healthcare provider right away if you experience any of the following: ? Bleeding from an injury or your nose that does not stop. ? Unusual colored urine (red or dark  brown) or unusual colored stools (red or black). ? Unusual bruising for unknown reasons. ? A serious fall or if you hit your head (even if there is no bleeding).  Some medicines may interact with Xarelto and might increase your risk of bleeding while on Xarelto. To help avoid this, consult your healthcare provider or pharmacist prior to using any new prescription or non-prescription medications, including herbals, vitamins, non-steroidal anti-inflammatory drugs (NSAIDs) and supplements.  This website has more information on Xarelto: [email or web address]

## 2018-01-24 ENCOUNTER — Other Ambulatory Visit: Payer: Self-pay | Admitting: Hematology

## 2018-01-24 MED ORDER — FENTANYL 12 MCG/HR TD PT72: 1.0000 | MEDICATED_PATCH | TRANSDERMAL | 0 refills | Status: DC

## 2018-01-27 DIAGNOSIS — C3492 Malignant neoplasm of unspecified part of left bronchus or lung: Secondary | ICD-10-CM | POA: Diagnosis not present

## 2018-01-27 DIAGNOSIS — C7951 Secondary malignant neoplasm of bone: Secondary | ICD-10-CM | POA: Diagnosis not present

## 2018-01-29 ENCOUNTER — Emergency Department (HOSPITAL_COMMUNITY): Payer: Medicare Other

## 2018-01-29 ENCOUNTER — Encounter (HOSPITAL_COMMUNITY): Payer: Self-pay | Admitting: Emergency Medicine

## 2018-01-29 ENCOUNTER — Other Ambulatory Visit: Payer: Self-pay

## 2018-01-29 ENCOUNTER — Inpatient Hospital Stay (HOSPITAL_COMMUNITY)
Admission: EM | Admit: 2018-01-29 | Discharge: 2018-02-07 | DRG: 871 | Disposition: A | Discharge status: Home Health | Payer: Medicare Other | Attending: Internal Medicine | Admitting: Internal Medicine

## 2018-01-29 DIAGNOSIS — D848 Other specified immunodeficiencies: Secondary | ICD-10-CM | POA: Diagnosis not present

## 2018-01-29 DIAGNOSIS — C781 Secondary malignant neoplasm of mediastinum: Secondary | ICD-10-CM | POA: Diagnosis present

## 2018-01-29 DIAGNOSIS — C3490 Malignant neoplasm of unspecified part of unspecified bronchus or lung: Secondary | ICD-10-CM | POA: Diagnosis present

## 2018-01-29 DIAGNOSIS — D649 Anemia, unspecified: Secondary | ICD-10-CM | POA: Diagnosis present

## 2018-01-29 DIAGNOSIS — R0902 Hypoxemia: Secondary | ICD-10-CM | POA: Diagnosis present

## 2018-01-29 DIAGNOSIS — Z886 Allergy status to analgesic agent status: Secondary | ICD-10-CM

## 2018-01-29 DIAGNOSIS — R6521 Severe sepsis with septic shock: Secondary | ICD-10-CM | POA: Diagnosis not present

## 2018-01-29 DIAGNOSIS — R627 Adult failure to thrive: Secondary | ICD-10-CM | POA: Diagnosis present

## 2018-01-29 DIAGNOSIS — R0689 Other abnormalities of breathing: Secondary | ICD-10-CM | POA: Diagnosis not present

## 2018-01-29 DIAGNOSIS — J189 Pneumonia, unspecified organism: Secondary | ICD-10-CM | POA: Diagnosis present

## 2018-01-29 DIAGNOSIS — Z79899 Other long term (current) drug therapy: Secondary | ICD-10-CM

## 2018-01-29 DIAGNOSIS — E871 Hypo-osmolality and hyponatremia: Secondary | ICD-10-CM | POA: Diagnosis present

## 2018-01-29 DIAGNOSIS — Z90722 Acquired absence of ovaries, bilateral: Secondary | ICD-10-CM

## 2018-01-29 DIAGNOSIS — Z923 Personal history of irradiation: Secondary | ICD-10-CM

## 2018-01-29 DIAGNOSIS — E440 Moderate protein-calorie malnutrition: Secondary | ICD-10-CM | POA: Diagnosis not present

## 2018-01-29 DIAGNOSIS — R509 Fever, unspecified: Secondary | ICD-10-CM

## 2018-01-29 DIAGNOSIS — Z7189 Other specified counseling: Secondary | ICD-10-CM

## 2018-01-29 DIAGNOSIS — E119 Type 2 diabetes mellitus without complications: Secondary | ICD-10-CM

## 2018-01-29 DIAGNOSIS — R0789 Other chest pain: Secondary | ICD-10-CM | POA: Diagnosis not present

## 2018-01-29 DIAGNOSIS — G9389 Other specified disorders of brain: Secondary | ICD-10-CM | POA: Diagnosis present

## 2018-01-29 DIAGNOSIS — G9341 Metabolic encephalopathy: Secondary | ICD-10-CM | POA: Diagnosis present

## 2018-01-29 DIAGNOSIS — I2699 Other pulmonary embolism without acute cor pulmonale: Secondary | ICD-10-CM | POA: Diagnosis not present

## 2018-01-29 DIAGNOSIS — R079 Chest pain, unspecified: Secondary | ICD-10-CM | POA: Diagnosis not present

## 2018-01-29 DIAGNOSIS — Z7901 Long term (current) use of anticoagulants: Secondary | ICD-10-CM

## 2018-01-29 DIAGNOSIS — Z8673 Personal history of transient ischemic attack (TIA), and cerebral infarction without residual deficits: Secondary | ICD-10-CM

## 2018-01-29 DIAGNOSIS — I639 Cerebral infarction, unspecified: Secondary | ICD-10-CM | POA: Diagnosis present

## 2018-01-29 DIAGNOSIS — E785 Hyperlipidemia, unspecified: Secondary | ICD-10-CM | POA: Diagnosis present

## 2018-01-29 DIAGNOSIS — Z515 Encounter for palliative care: Secondary | ICD-10-CM

## 2018-01-29 DIAGNOSIS — Z833 Family history of diabetes mellitus: Secondary | ICD-10-CM

## 2018-01-29 DIAGNOSIS — E1165 Type 2 diabetes mellitus with hyperglycemia: Secondary | ICD-10-CM | POA: Diagnosis present

## 2018-01-29 DIAGNOSIS — D630 Anemia in neoplastic disease: Secondary | ICD-10-CM | POA: Diagnosis present

## 2018-01-29 DIAGNOSIS — Z823 Family history of stroke: Secondary | ICD-10-CM

## 2018-01-29 DIAGNOSIS — A419 Sepsis, unspecified organism: Secondary | ICD-10-CM | POA: Diagnosis not present

## 2018-01-29 DIAGNOSIS — Z820 Family history of epilepsy and other diseases of the nervous system: Secondary | ICD-10-CM

## 2018-01-29 DIAGNOSIS — I10 Essential (primary) hypertension: Secondary | ICD-10-CM | POA: Diagnosis present

## 2018-01-29 DIAGNOSIS — R0602 Shortness of breath: Secondary | ICD-10-CM | POA: Diagnosis not present

## 2018-01-29 DIAGNOSIS — K5909 Other constipation: Secondary | ICD-10-CM | POA: Diagnosis present

## 2018-01-29 DIAGNOSIS — Z90710 Acquired absence of both cervix and uterus: Secondary | ICD-10-CM

## 2018-01-29 DIAGNOSIS — Z86010 Personal history of colonic polyps: Secondary | ICD-10-CM

## 2018-01-29 DIAGNOSIS — Z79891 Long term (current) use of opiate analgesic: Secondary | ICD-10-CM

## 2018-01-29 DIAGNOSIS — Z8249 Family history of ischemic heart disease and other diseases of the circulatory system: Secondary | ICD-10-CM

## 2018-01-29 DIAGNOSIS — C3412 Malignant neoplasm of upper lobe, left bronchus or lung: Secondary | ICD-10-CM | POA: Diagnosis present

## 2018-01-29 DIAGNOSIS — R9431 Abnormal electrocardiogram [ECG] [EKG]: Secondary | ICD-10-CM | POA: Diagnosis not present

## 2018-01-29 DIAGNOSIS — C7951 Secondary malignant neoplasm of bone: Secondary | ICD-10-CM | POA: Diagnosis present

## 2018-01-29 DIAGNOSIS — Z6828 Body mass index (BMI) 28.0-28.9, adult: Secondary | ICD-10-CM

## 2018-01-29 DIAGNOSIS — C770 Secondary and unspecified malignant neoplasm of lymph nodes of head, face and neck: Secondary | ICD-10-CM | POA: Diagnosis present

## 2018-01-29 DIAGNOSIS — R531 Weakness: Secondary | ICD-10-CM | POA: Diagnosis not present

## 2018-01-29 DIAGNOSIS — Y95 Nosocomial condition: Secondary | ICD-10-CM | POA: Diagnosis present

## 2018-01-29 HISTORY — DX: Secondary malignant neoplasm of bone: C79.51

## 2018-01-29 HISTORY — DX: Malignant (primary) neoplasm, unspecified: C80.1

## 2018-01-29 HISTORY — DX: Malignant neoplasm of unspecified part of unspecified bronchus or lung: C34.90

## 2018-01-29 LAB — COMPREHENSIVE METABOLIC PANEL
ALT: 19 U/L (ref 0–44)
AST: 28 U/L (ref 15–41)
Albumin: 2.9 g/dL — ABNORMAL LOW (ref 3.5–5.0)
Alkaline Phosphatase: 119 U/L (ref 38–126)
Anion gap: 9 (ref 5–15)
BUN: 17 mg/dL (ref 8–23)
CO2: 23 mmol/L (ref 22–32)
Calcium: 8 mg/dL — ABNORMAL LOW (ref 8.9–10.3)
Chloride: 96 mmol/L — ABNORMAL LOW (ref 98–111)
Creatinine, Ser: 0.91 mg/dL (ref 0.44–1.00)
GFR calc Af Amer: >60 mL/min (ref >60)
GFR calc non Af Amer: >60 mL/min (ref >60)
Glucose, Bld: 197 mg/dL — ABNORMAL HIGH (ref 70–99)
Potassium: 3.7 mmol/L (ref 3.5–5.1)
Sodium: 128 mmol/L — ABNORMAL LOW (ref 135–145)
TOTAL PROTEIN: 6.4 g/dL — AB (ref 6.5–8.1)
Total Bilirubin: 0.8 mg/dL (ref 0.3–1.2)

## 2018-01-29 LAB — CBC WITH DIFFERENTIAL/PLATELET
Abs Immature Granulocytes: 0.1 10*3/uL — ABNORMAL HIGH (ref 0.00–0.07)
BASOS PCT: 0 %
Basophils Absolute: 0 10*3/uL (ref 0.0–0.1)
Eosinophils Absolute: 0.1 10*3/uL (ref 0.0–0.5)
Eosinophils Relative: 1 %
HCT: 28.6 % — ABNORMAL LOW (ref 36.0–46.0)
Hemoglobin: 9.2 g/dL — ABNORMAL LOW (ref 12.0–15.0)
Immature Granulocytes: 1 %
Lymphocytes Relative: 17 %
Lymphs Abs: 1.4 10*3/uL (ref 0.7–4.0)
MCH: 29.5 pg (ref 26.0–34.0)
MCHC: 32.2 g/dL (ref 30.0–36.0)
MCV: 91.7 fL (ref 80.0–100.0)
Monocytes Absolute: 0.7 10*3/uL (ref 0.1–1.0)
Monocytes Relative: 9 %
Neutro Abs: 5.7 10*3/uL (ref 1.7–7.7)
Neutrophils Relative %: 72 %
PLATELETS: 288 10*3/uL (ref 150–400)
RBC: 3.12 MIL/uL — ABNORMAL LOW (ref 3.87–5.11)
RDW: 15.2 % (ref 11.5–15.5)
WBC: 8 10*3/uL (ref 4.0–10.5)
nRBC: 0 % (ref 0.0–0.2)

## 2018-01-29 LAB — PROTIME-INR
INR: 4.93
Prothrombin Time: 45.1 seconds — ABNORMAL HIGH (ref 11.4–15.2)

## 2018-01-29 MED ORDER — ACETAMINOPHEN 650 MG RE SUPP
650.0000 mg | Freq: Once | RECTAL | Status: AC
  Administered 2018-01-29: 650 mg via RECTAL
  Filled 2018-01-29: qty 1

## 2018-01-29 NOTE — ED Notes (Signed)
Bed: CH79 Expected date:  Expected time:  Means of arrival:  Comments: EMS 73 yo female loss of appetite and fever-sepsis protocol 109/57 RR30 O2 sat 88% RA HR 98 CBG 235

## 2018-01-29 NOTE — ED Provider Notes (Signed)
[facility] DEPT Provider Note   CSN: [id number] Arrival date & time: 01/29/18  2109     History   Chief Complaint Chief Complaint  Patient presents with  . Failure To Thrive    HPI Elizabeth Calhoun is a 73 y.o. female with a history of PE, metastatic stage IV lung cancer, diabetes mellitus who presents to the emergency department with a chief complaint of decreased appetite.  Family reports waxing and waning anorexia over the last week, that is worsened over the last couple of days so they called 911.  She is also had waxing and waning confusion over the last few days.  The patient's daughter, her primary caregiver, reports that she has been speaking more frequently about wanting to go home to be with Jesus and with the patient's husband who passed away several years ago.  She was refusing to eat or take Tylenol earlier tonight.  Family reports she had a fever of 102 earlier today with associated chills, nonproductive cough, and headache.  Family also reports she has been complaining of intermittent lower abdominal pain over the last few days.  She denies nausea, vomiting, diarrhea, dysuria, hematuria, vaginal pain or discharge, visual changes, otalgia, sore throat, leg swelling.   When EMS arrived, the patient was found to be satting in the 80s on room air she was placed on 2.5 L of O2 nasal cannula.  When asked about shortness of breath and chest pain, the patient denies these symptoms, but family notes that she has barely gotten out of bed over the last few days.  She reports positive flu contacts 5 days ago at a birthday party.  She was also recently admitted from January 12 through the 14th for a PE and was started on rivaroxaban.  She has not missed any doses since starting the medication.  The history is provided by the patient. No language interpreter was used.    Past Medical History:  Diagnosis Date  . Adenomatous colon polyp   . Cancer ([place])     . Diabetes mellitus without complication ([place])   . Hemorrhoids   . Menopause   . OA (osteoarthritis)   . Obesity   . Other and unspecified hyperlipidemia   . Spine metastasis ([place])   . Stage 4 lung cancer ([place])   . Stroke [facility])     Patient Active Problem List   Diagnosis Date Noted  . Lung cancer ([place]) 01/30/2018  . HTN (hypertension) 01/30/2018  . HLD (hyperlipidemia) 01/30/2018  . Hyponatremia 01/30/2018  . Stroke ([place]) 01/30/2018  . Sepsis ([place]) 01/30/2018  . Normocytic anemia 01/30/2018  . Diabetes mellitus without complication ([place]) 85/27/7824  . Acute metabolic encephalopathy 23/53/6144  . Pulmonary embolism ([place]) 01/19/2018  . Bone metastasis ([place]) 12/16/2017    Past Surgical History:  Procedure Laterality Date  . HYSTERECTOMY ABDOMINAL WITH SALPINGO-OOPHORECTOMY  1990  . KNEE ARTHROSCOPY Left 2009  . SHOULDER SURGERY  2005     OB History   No obstetric history on file.      Home Medications    Prior to Admission medications   Medication Sig Start Date End Date Taking? Authorizing Provider  benzonatate (TESSALON) 100 MG capsule Take 1 capsule (100 mg total) by mouth 3 (three) times daily as needed for cough. 11/28/17  Yes Brunetta Genera, MD  fentaNYL (DURAGESIC) 12 MCG/HR Place 1 patch onto the skin every 3 (three) days. 01/24/18  Yes Brunetta Genera, MD  hydrochlorothiazide (HYDRODIURIL) 25 MG tablet  Take 25 mg by mouth daily. 10/09/16  Yes [provider]  HYDROmorphone (DILAUDID) 2 MG tablet Take 1-2 tablets (2-4 mg total) by mouth every 4 (four) hours as needed for severe pain. 12/10/17  Yes Brunetta Genera, MD  insulin degludec (TRESIBA FLEXTOUCH) 100 UNIT/ML SOPN FlexTouch Pen Inject 10 Units into the skin at bedtime.   Yes [provider]  losartan (COZAAR) 25 MG tablet Take 25 mg by mouth daily.   Yes [provider]  ondansetron (ZOFRAN ODT) 4 MG disintegrating tablet Take 1 tablet (4 mg total) by mouth every 8  (eight) hours as needed for nausea or vomiting. 12/07/17  Yes Long, Wonda Olds, MD  Rivaroxaban 15 & 20 MG TBPK Take as directed on package: Start with one 15mg  tablet by mouth twice a day with food. On Day 22, switch to one 20mg  tablet once a day with food. 01/21/18  Yes Arrien, Jimmy Picket, MD  rosuvastatin (CRESTOR) 40 MG tablet Take 40 mg by mouth daily. 10/01/16  Yes [provider]  zolpidem (AMBIEN) 10 MG tablet Take 10 mg by mouth at bedtime.    Yes [provider]  polyethylene glycol (MIRALAX / GLYCOLAX) packet Take 17 g by mouth daily for 30 days. 01/21/18 02/20/18  Arrien, Jimmy Picket, MD  sorbitol 70 % SOLN Take 30 mLs by mouth daily as needed for severe constipation. 01/21/18   Arrien, Jimmy Picket, MD    Family History Family History  Problem Relation Age of Onset  . Heart attack Father   . Heart disease Brother        1/8  . Heart disease Brother        2/8  . Heart disease Brother        3/8  . Heart disease Brother        4/8  . Heart disease Brother        5/8  . Heart disease Brother        6/8  . Cancer Brother        7/8  . Seizures Brother        8/8  . Stroke Brother        8/8  . Heart disease Sister        1/6  . Alzheimer's disease Sister        2/6  . Diabetes Sister        3/6  . Hypertension Sister        3/6  . Hypertension Sister        4/6  . Diabetes Sister        4/6  . Diabetes Daughter     Social History Social History   Tobacco Use  . Smoking status: Never Smoker  . Smokeless tobacco: Never Used  Substance Use Topics  . Alcohol use: Not Currently    Comment: occasionally  . Drug use: No     Allergies   Aspirin   Review of Systems Review of Systems  Constitutional: Positive for appetite change, chills and fever. Negative for activity change.  Respiratory: Positive for cough. Negative for shortness of breath.   Cardiovascular: Negative for chest pain, palpitations and leg swelling.    Gastrointestinal: Negative for abdominal pain, diarrhea, nausea and vomiting.  Genitourinary: Negative for dysuria.  Musculoskeletal: Negative for back pain.  Skin: Negative for rash.  Allergic/Immunologic: Negative for immunocompromised state.  Neurological: Positive for headaches.  Psychiatric/Behavioral: Negative for confusion.     Physical  Exam Updated Vital Signs BP (!) 106/57   Pulse 94   Temp 98.2 F (36.8 C)   Resp (!) 27   SpO2 94%   Physical Exam Vitals signs and nursing note reviewed.  Constitutional:      General: She is not in acute distress.    Appearance: She is ill-appearing.     Interventions: Nasal cannula in place.  HENT:     Head: Normocephalic.  Eyes:     Conjunctiva/sclera: Conjunctivae normal.  Neck:     Musculoskeletal: Neck supple.  Cardiovascular:     Rate and Rhythm: Normal rate and regular rhythm.     Heart sounds: No murmur. No friction rub. No gallop.   Pulmonary:     Effort: No respiratory distress.  Abdominal:     General: There is no distension.     Palpations: Abdomen is soft. There is no mass.     Tenderness: There is abdominal tenderness. There is no right CVA tenderness, left CVA tenderness, guarding or rebound.     Hernia: No hernia is present.     Comments: Mild tenderness palpation to the bilateral lower abdomen without rebound or guarding.  Upper abdomen is unremarkable.  Skin:    General: Skin is warm.     Findings: No rash.  Neurological:     Mental Status: She is alert.  Psychiatric:        Behavior: Behavior normal.      ED Treatments / Results  Labs (all labs ordered are listed, but only abnormal results are displayed) Labs Reviewed  COMPREHENSIVE METABOLIC PANEL - Abnormal; Notable for the following components:      Result Value   Sodium 128 (*)    Chloride 96 (*)    Glucose, Bld 197 (*)    Calcium 8.0 (*)    Total Protein 6.4 (*)    Albumin 2.9 (*)    All other components within normal limits  CBC WITH  DIFFERENTIAL/PLATELET - Abnormal; Notable for the following components:   RBC 3.12 (*)    Hemoglobin 9.2 (*)    HCT 28.6 (*)    Abs Immature Granulocytes 0.10 (*)    All other components within normal limits  PROTIME-INR - Abnormal; Notable for the following components:   Prothrombin Time 45.1 (*)    INR 4.93 (*)    All other components within normal limits  URINALYSIS, ROUTINE W REFLEX MICROSCOPIC - Abnormal; Notable for the following components:   APPearance HAZY (*)    Hgb urine dipstick SMALL (*)    Bacteria, UA RARE (*)    All other components within normal limits  PROTIME-INR - Abnormal; Notable for the following components:   Prothrombin Time 37.0 (*)    All other components within normal limits  BASIC METABOLIC PANEL - Abnormal; Notable for the following components:   Sodium 131 (*)    Potassium 3.4 (*)    CO2 20 (*)    Glucose, Bld 216 (*)    Calcium 7.2 (*)    All other components within normal limits  CBC - Abnormal; Notable for the following components:   RBC 2.90 (*)    Hemoglobin 8.7 (*)    HCT 27.0 (*)    All other components within normal limits  HEMOGLOBIN A1C - Abnormal; Notable for the following components:   Hgb A1c MFr Bld 10.4 (*)    All other components within normal limits  CULTURE, BLOOD (ROUTINE X 2)  MRSA PCR SCREENING  CULTURE, BLOOD (  ROUTINE X 2)  URINE CULTURE  RESPIRATORY PANEL BY PCR  LACTIC ACID, PLASMA  INFLUENZA PANEL BY PCR (TYPE A & B)  LACTIC ACID, PLASMA  PROCALCITONIN  BRAIN NATRIURETIC PEPTIDE  LACTIC ACID, PLASMA    EKG EKG Interpretation  Date/Time:  Thursday January 30 2018 01:15:52 EST Ventricular Rate:  82 PR Interval:    QRS Duration: 90 QT Interval:  392 QTC Calculation: 458 R Axis:   11 Text Interpretation:  Sinus rhythm Borderline short PR interval Low voltage, precordial leads Abnormal R-wave progression, early transition Nonspecific T abnormalities, lateral leads No acute changes Confirmed by Varney Biles  ([id number]) on 01/30/2018 2:07:23 AM   Radiology Dg Chest 2 View  Result Date: 01/29/2018 CLINICAL DATA:  Loss of appetite. Stage IV lung cancer. EXAM: CHEST - 2 VIEW COMPARISON:  01/19/2018 CT FINDINGS: Low lung volumes. Borderline cardiomegaly with aortic atherosclerosis. Known spiculated AP window nodal mass as seen on prior CT measuring up to 2.3 cm radiographically likely accounts for the pulmonary opacity seen in the region of the AP window. Central vascular congestion is otherwise noted bilaterally. No aggressive osseous lesions. Osteoarthritis of the [facility] and glenohumeral joints. IMPRESSION: Low lung volumes with borderline cardiomegaly and aortic atherosclerosis. Mild central vascular congestion is noted. Spiculated masslike opacity in the AP window compatible with known nodal mass on recent CT. Electronically Signed   By: Ashley Royalty M.D.   On: 01/29/2018 22:29   Ct Angio Chest Pe W And/or Wo Contrast  Result Date: 01/30/2018 CLINICAL DATA:  73 year old female with failure to thrive. EXAM: CT ANGIOGRAPHY CHEST WITH CONTRAST TECHNIQUE: Multidetector CT imaging of the chest was performed using the standard protocol during bolus administration of intravenous contrast. Multiplanar CT image reconstructions and MIPs were obtained to evaluate the vascular anatomy. CONTRAST:  34mL ISOVUE-370 IOPAMIDOL (ISOVUE-370) INJECTION 76% COMPARISON:  Chest radiograph dated 01/29/2018 and CT dated 01/19/2018 FINDINGS: Cardiovascular: There is mild cardiomegaly. No pericardial effusion. There is multi vessel coronary vascular calcification. Mild atherosclerotic calcification of the thoracic aorta. No aneurysmal dilatation or evidence of dissection. There is mild dilatation of the main pulmonary trunk suggestive of a degree of pulmonary hypertension. Clinical correlation is recommended. Residual embolus noted in the right lower lobe segmental branch pulmonary artery as well as in the right upper lobe segmental branch. No  new pulmonary artery embolus identified. There is high-grade narrowing of the left main pulmonary artery secondary to hilar mass/metastasis. Mediastinum/Nodes: Spiculated left hilar/suprahilar mass/metastasis measuring 3.8 x 3.0 cm similar to prior CT. There is encasement and narrowing of the left main pulmonary artery. There is diffuse thickening of the left mediastinal pleural surface. Right paratracheal lymph node measures 9 mm short axis. No mediastinal fluid collection. Lungs/Pleura: A 2.7 x 2.6 cm left apical spiculated mass as well as smaller adjacent nodular densities, likely satellite lesions. There is diffuse ground-glass airspace density and nodularity right greater left, new or worsened since the prior CT. The rapid progression since the prior CT suggest an acute etiology such as edema or pneumonia. Lymphangitic spread of tumor is considered less likely given rapid interval development. Clinical correlation and follow-up recommended. No large pleural effusion. There is no pneumothorax. The central airways are patent. Upper Abdomen: No acute abnormality. Musculoskeletal: Osteopenia with degenerative changes of the spine. Diffuse sclerotic lesions throughout the spine most consistent with metastatic disease. There is a lytic lesion involving the anterior half of T11 consistent with metastatic disease. Additional lytic metastatic disease involving the left scapula as seen previously.  No acute pathologic fracture. Review of the MIP images confirms the above findings. IMPRESSION: 1. Interval development of bilateral ground-glass and nodular density most consistent with edema or pneumonia. Clinical correlation is recommended. 2. Left apical malignancy with nodal and osseous metastatic disease as seen previously. 3. Encasement and high-grade narrowing of the left main pulmonary artery by the hilar mass/metastatic node. 4. Residual pulmonary artery emboli similar to prior CT.  No new PE. Electronically Signed    By: Anner Crete M.D.   On: 01/30/2018 02:24    Procedures .Critical Care Performed by: Joanne Gavel, PA-C Authorized by: Joanne Gavel, PA-C   Critical care provider statement:    Critical care time (minutes):  40   Critical care time was exclusive of:  Separately billable procedures and treating other patients and teaching time   Critical care was necessary to treat or prevent imminent or life-threatening deterioration of the following conditions:  Sepsis   Critical care was time spent personally by me on the following activities:  Re-evaluation of patient's condition, review of old charts, ordering and review of radiographic studies, ordering and review of laboratory studies, ordering and performing treatments and interventions, examination of patient, evaluation of patient's response to treatment, development of treatment plan with patient or surrogate and obtaining history from patient or surrogate   (including critical care time)  Medications Ordered in ED Medications  sodium chloride (PF) 0.9 % injection (has no administration in time range)  iopamidol (ISOVUE-370) 76 % injection (has no administration in time range)  HYDROmorphone (DILAUDID) tablet 1 mg (has no administration in time range)  rosuvastatin (CRESTOR) tablet 40 mg (has no administration in time range)  zolpidem (AMBIEN) tablet 5 mg (has no administration in time range)  insulin glargine (LANTUS) injection 5 Units (has no administration in time range)  polyethylene glycol (MIRALAX / GLYCOLAX) packet 17 g (has no administration in time range)  Rivaroxaban (XARELTO) tablet 15 mg (has no administration in time range)  sorbitol 70 % solution 30 mL (has no administration in time range)  benzonatate (TESSALON) capsule 100 mg (has no administration in time range)  0.9 %  sodium chloride infusion (has no administration in time range)  acetaminophen (TYLENOL) tablet 650 mg (has no administration in time range)    Or    acetaminophen (TYLENOL) suppository 650 mg (has no administration in time range)  ondansetron (ZOFRAN) tablet 4 mg (has no administration in time range)    Or  ondansetron (ZOFRAN) injection 4 mg (has no administration in time range)  ceFEPIme (MAXIPIME) 1 g in sodium chloride 0.9 % 100 mL IVPB (has no administration in time range)  vancomycin (VANCOCIN) 1,250 mg in sodium chloride 0.9 % 250 mL IVPB (has no administration in time range)  MEDLINE mouth rinse (has no administration in time range)  rivaroxaban (XARELTO) tablet 20 mg (has no administration in time range)  insulin aspart (novoLOG) injection 0-9 Units (has no administration in time range)  acetaminophen (TYLENOL) suppository 650 mg (650 mg Rectal Given 01/29/18 2202)  sodium chloride 0.9 % bolus 500 mL (0 mLs Intravenous Stopped 01/30/18 0328)  sodium chloride 0.9 % bolus 1,950 mL (0 mL/kg  65 kg Intravenous Stopped 01/30/18 0350)  ceFEPIme (MAXIPIME) 2 g in sodium chloride 0.9 % 100 mL IVPB (0 g Intravenous Stopped 01/30/18 0328)  vancomycin (VANCOCIN) 1,500 mg in sodium chloride 0.9 % 500 mL IVPB ( Intravenous Restarted 01/30/18 0400)  iopamidol (ISOVUE-370) 76 % injection 100 mL (80 mLs Intravenous  Contrast Given 01/30/18 0154)  hydrocortisone sodium succinate (SOLU-CORTEF) 100 MG injection 100 mg (100 mg Intravenous Given 01/30/18 0403)  sodium chloride 0.9 % bolus 500 mL (500 mLs Intravenous New Bag/Given 01/30/18 0350)     Initial Impression / Assessment and Plan / ED Course  I have reviewed the triage vital signs and the nursing notes.  Pertinent labs & imaging results that were available during my care of the patient were reviewed by me and considered in my medical decision making (see chart for details).     73 year old female with a history of PE, metastatic stage IV lung cancer, diabetes mellitus with fever, waxing and waning confusion, waxing and waning anorexia, and cough.  Family reports the patient has barely gotten  out of bed since she was recently discharged from the hospital after she was diagnosed with a PE.  On my exam, she is alert and oriented x4.  Lungs are clear to auscultation bilaterally, but this may be secondary to body habitus.  Patient was seen and evaluated along with Dr. Kathrynn Humble, attending physician.  Patient was febrile to 101.9 on arrival and tachypneic with a rate of 30.  She was hypoxic in the 80s on EMS arrival and was placed on 2 L nasal cannula.  She does not wear home O2.  Initial blood pressure 120/60.  Patient's fever improved following Tylenol administration.  The patient became progressively hypotensive in the 80s over 50s and aggressive IV fluid resuscitation was initiated at 30 mL's per kilo.  Following aggressive fluid resuscitation, blood pressure improved to 120s over 70s.  Labs are notable for INR of 4.93, which could be secondary to recent initiation of rivaroxaban.  Hyponatremic at 128, which I suspect is secondary to dehydration.  She has no leukocytosis or elevated lactate at this time, but suspected source of sepsis is pulmonary.  Chest x-ray with low lung volumes with borderline cardiomegaly and aortic atherosclerosis.  Patient has no history of heart failure with recent EF of 60 to 65% on echo.  Given recent diagnosis of PE with right heart strain, will repeat PE study to ensure patient's acute hypoxia is not secondary to failure of rivaroxaban versus septic emboli.  Antibiotics initiated with vancomycin and cefepime for sepsis of unknown etiology at this time.  Code sepsis was previously initiated.  EKG with no acute changes.  CT PE with interval development of bilateral groundglass and nodular density most consistent with edema or pneumonia.  Discussed these findings with the patient's family.  Consulted Dr. Blaine Hamper with the hospitalist team who will accept the patient for admission to the stepdown unit. The patient appears reasonably stabilized for admission considering the  current resources, flow, and capabilities available in the ED at this time, and I doubt any other [facility] requiring further screening and/or treatment in the ED prior to admission.   Final Clinical Impressions(s) / ED Diagnoses   Final diagnoses:  Septic shock [facility])    ED Discharge Orders    None       Janneth Krasner A, PA-C 01/30/18 0900    Varney Biles, MD 01/31/18 [phone]

## 2018-01-29 NOTE — ED Notes (Signed)
Date and time results received: 01/29/18 22:32  Test: INR Critical Value: 4.93  Name of Provider Notified: Mia McDonald   Orders Received? Or Actions Taken?: Will continue to monitor and await for new orders.

## 2018-01-29 NOTE — ED Triage Notes (Signed)
Family called EMS due to patient's loss of appetite. She did not eat diner today and has been eating and drinking very little lately. She has stage 4 lung cancer and is not receiving treatment at this moment. She is A&O x4. Last week she was treated for a pulmonary embolism.   EMS vitals and CBG: 102 Temp 98% O2 sats on 2L nasal canula 30 Resp Rate  98 HR 235 CBG 120/60 BP

## 2018-01-30 ENCOUNTER — Other Ambulatory Visit: Payer: Self-pay

## 2018-01-30 ENCOUNTER — Emergency Department (HOSPITAL_COMMUNITY): Payer: Medicare Other

## 2018-01-30 ENCOUNTER — Encounter: Payer: Self-pay | Admitting: Radiation Oncology

## 2018-01-30 ENCOUNTER — Encounter (HOSPITAL_COMMUNITY): Payer: Self-pay

## 2018-01-30 DIAGNOSIS — D848 Other specified immunodeficiencies: Secondary | ICD-10-CM | POA: Diagnosis present

## 2018-01-30 DIAGNOSIS — C3410 Malignant neoplasm of upper lobe, unspecified bronchus or lung: Secondary | ICD-10-CM | POA: Diagnosis not present

## 2018-01-30 DIAGNOSIS — R4182 Altered mental status, unspecified: Secondary | ICD-10-CM | POA: Diagnosis not present

## 2018-01-30 DIAGNOSIS — C3412 Malignant neoplasm of upper lobe, left bronchus or lung: Secondary | ICD-10-CM | POA: Diagnosis present

## 2018-01-30 DIAGNOSIS — C781 Secondary malignant neoplasm of mediastinum: Secondary | ICD-10-CM | POA: Diagnosis present

## 2018-01-30 DIAGNOSIS — E785 Hyperlipidemia, unspecified: Secondary | ICD-10-CM | POA: Diagnosis present

## 2018-01-30 DIAGNOSIS — C7951 Secondary malignant neoplasm of bone: Secondary | ICD-10-CM | POA: Diagnosis present

## 2018-01-30 DIAGNOSIS — Z923 Personal history of irradiation: Secondary | ICD-10-CM | POA: Diagnosis not present

## 2018-01-30 DIAGNOSIS — E1165 Type 2 diabetes mellitus with hyperglycemia: Secondary | ICD-10-CM | POA: Diagnosis present

## 2018-01-30 DIAGNOSIS — A419 Sepsis, unspecified organism: Secondary | ICD-10-CM | POA: Diagnosis present

## 2018-01-30 DIAGNOSIS — D630 Anemia in neoplastic disease: Secondary | ICD-10-CM | POA: Diagnosis present

## 2018-01-30 DIAGNOSIS — D649 Anemia, unspecified: Secondary | ICD-10-CM | POA: Diagnosis not present

## 2018-01-30 DIAGNOSIS — C3490 Malignant neoplasm of unspecified part of unspecified bronchus or lung: Secondary | ICD-10-CM

## 2018-01-30 DIAGNOSIS — R5081 Fever presenting with conditions classified elsewhere: Secondary | ICD-10-CM | POA: Diagnosis not present

## 2018-01-30 DIAGNOSIS — K5900 Constipation, unspecified: Secondary | ICD-10-CM | POA: Diagnosis not present

## 2018-01-30 DIAGNOSIS — E871 Hypo-osmolality and hyponatremia: Secondary | ICD-10-CM | POA: Diagnosis present

## 2018-01-30 DIAGNOSIS — Z7901 Long term (current) use of anticoagulants: Secondary | ICD-10-CM | POA: Diagnosis not present

## 2018-01-30 DIAGNOSIS — Z886 Allergy status to analgesic agent status: Secondary | ICD-10-CM | POA: Diagnosis not present

## 2018-01-30 DIAGNOSIS — Z515 Encounter for palliative care: Secondary | ICD-10-CM | POA: Diagnosis not present

## 2018-01-30 DIAGNOSIS — E119 Type 2 diabetes mellitus without complications: Secondary | ICD-10-CM

## 2018-01-30 DIAGNOSIS — Z7189 Other specified counseling: Secondary | ICD-10-CM

## 2018-01-30 DIAGNOSIS — C770 Secondary and unspecified malignant neoplasm of lymph nodes of head, face and neck: Secondary | ICD-10-CM | POA: Diagnosis present

## 2018-01-30 DIAGNOSIS — R509 Fever, unspecified: Secondary | ICD-10-CM | POA: Diagnosis not present

## 2018-01-30 DIAGNOSIS — G9389 Other specified disorders of brain: Secondary | ICD-10-CM | POA: Diagnosis present

## 2018-01-30 DIAGNOSIS — I10 Essential (primary) hypertension: Secondary | ICD-10-CM | POA: Diagnosis present

## 2018-01-30 DIAGNOSIS — Z86010 Personal history of colonic polyps: Secondary | ICD-10-CM | POA: Diagnosis not present

## 2018-01-30 DIAGNOSIS — Y95 Nosocomial condition: Secondary | ICD-10-CM | POA: Diagnosis present

## 2018-01-30 DIAGNOSIS — R627 Adult failure to thrive: Secondary | ICD-10-CM | POA: Diagnosis present

## 2018-01-30 DIAGNOSIS — E440 Moderate protein-calorie malnutrition: Secondary | ICD-10-CM | POA: Diagnosis present

## 2018-01-30 DIAGNOSIS — Z90710 Acquired absence of both cervix and uterus: Secondary | ICD-10-CM | POA: Diagnosis not present

## 2018-01-30 DIAGNOSIS — R918 Other nonspecific abnormal finding of lung field: Secondary | ICD-10-CM | POA: Diagnosis not present

## 2018-01-30 DIAGNOSIS — J189 Pneumonia, unspecified organism: Secondary | ICD-10-CM | POA: Diagnosis present

## 2018-01-30 DIAGNOSIS — Z8673 Personal history of transient ischemic attack (TIA), and cerebral infarction without residual deficits: Secondary | ICD-10-CM | POA: Diagnosis not present

## 2018-01-30 DIAGNOSIS — R0902 Hypoxemia: Secondary | ICD-10-CM | POA: Diagnosis present

## 2018-01-30 DIAGNOSIS — M549 Dorsalgia, unspecified: Secondary | ICD-10-CM | POA: Diagnosis not present

## 2018-01-30 DIAGNOSIS — I2699 Other pulmonary embolism without acute cor pulmonale: Secondary | ICD-10-CM | POA: Diagnosis present

## 2018-01-30 DIAGNOSIS — K5909 Other constipation: Secondary | ICD-10-CM | POA: Diagnosis present

## 2018-01-30 DIAGNOSIS — J90 Pleural effusion, not elsewhere classified: Secondary | ICD-10-CM | POA: Diagnosis not present

## 2018-01-30 DIAGNOSIS — G9341 Metabolic encephalopathy: Secondary | ICD-10-CM | POA: Diagnosis present

## 2018-01-30 DIAGNOSIS — R531 Weakness: Secondary | ICD-10-CM | POA: Diagnosis not present

## 2018-01-30 DIAGNOSIS — I639 Cerebral infarction, unspecified: Secondary | ICD-10-CM | POA: Diagnosis not present

## 2018-01-30 DIAGNOSIS — Z86711 Personal history of pulmonary embolism: Secondary | ICD-10-CM | POA: Diagnosis not present

## 2018-01-30 DIAGNOSIS — Z6828 Body mass index (BMI) 28.0-28.9, adult: Secondary | ICD-10-CM | POA: Diagnosis not present

## 2018-01-30 LAB — INFLUENZA PANEL BY PCR (TYPE A & B)
INFLBPCR: NEGATIVE
Influenza A By PCR: NEGATIVE

## 2018-01-30 LAB — URINALYSIS, ROUTINE W REFLEX MICROSCOPIC
Bilirubin Urine: NEGATIVE
Glucose, UA: NEGATIVE mg/dL
Ketones, ur: NEGATIVE mg/dL
Leukocytes, UA: NEGATIVE
Nitrite: NEGATIVE
PH: 5 (ref 5.0–8.0)
Protein, ur: NEGATIVE mg/dL
Specific Gravity, Urine: 1.02 (ref 1.005–1.030)

## 2018-01-30 LAB — BASIC METABOLIC PANEL
Anion gap: 8 (ref 5–15)
BUN: 14 mg/dL (ref 8–23)
CO2: 20 mmol/L — ABNORMAL LOW (ref 22–32)
Calcium: 7.2 mg/dL — ABNORMAL LOW (ref 8.9–10.3)
Chloride: 103 mmol/L (ref 98–111)
Creatinine, Ser: 0.83 mg/dL (ref 0.44–1.00)
GFR calc Af Amer: >60 mL/min (ref >60)
Glucose, Bld: 216 mg/dL — ABNORMAL HIGH (ref 70–99)
Potassium: 3.4 mmol/L — ABNORMAL LOW (ref 3.5–5.1)
Sodium: 131 mmol/L — ABNORMAL LOW (ref 135–145)

## 2018-01-30 LAB — RESPIRATORY PANEL BY PCR
Adenovirus: NOT DETECTED
Bordetella pertussis: NOT DETECTED
Chlamydophila pneumoniae: NOT DETECTED
Coronavirus 229E: NOT DETECTED
Coronavirus HKU1: NOT DETECTED
Coronavirus NL63: NOT DETECTED
Coronavirus OC43: NOT DETECTED
INFLUENZA A-RVPPCR: NOT DETECTED
Influenza B: NOT DETECTED
Metapneumovirus: NOT DETECTED
Mycoplasma pneumoniae: NOT DETECTED
PARAINFLUENZA VIRUS 4-RVPPCR: NOT DETECTED
Parainfluenza Virus 1: NOT DETECTED
Parainfluenza Virus 2: NOT DETECTED
Parainfluenza Virus 3: NOT DETECTED
Respiratory Syncytial Virus: NOT DETECTED
Rhinovirus / Enterovirus: NOT DETECTED

## 2018-01-30 LAB — CBC
HCT: 27 % — ABNORMAL LOW (ref 36.0–46.0)
Hemoglobin: 8.7 g/dL — ABNORMAL LOW (ref 12.0–15.0)
MCH: 30 pg (ref 26.0–34.0)
MCHC: 32.2 g/dL (ref 30.0–36.0)
MCV: 93.1 fL (ref 80.0–100.0)
PLATELETS: 238 10*3/uL (ref 150–400)
RBC: 2.9 MIL/uL — ABNORMAL LOW (ref 3.87–5.11)
RDW: 15.3 % (ref 11.5–15.5)
WBC: 5 10*3/uL (ref 4.0–10.5)
nRBC: 0 % (ref 0.0–0.2)

## 2018-01-30 LAB — PROTIME-INR
INR: 3.81
Prothrombin Time: 37 seconds — ABNORMAL HIGH (ref 11.4–15.2)

## 2018-01-30 LAB — GLUCOSE, CAPILLARY
Glucose-Capillary: 218 mg/dL — ABNORMAL HIGH (ref 70–99)
Glucose-Capillary: 304 mg/dL — ABNORMAL HIGH (ref 70–99)
Glucose-Capillary: 292 mg/dL — ABNORMAL HIGH (ref 70–99)
Glucose-Capillary: 202 mg/dL — ABNORMAL HIGH (ref 70–99)

## 2018-01-30 LAB — HEMOGLOBIN A1C
Hgb A1c MFr Bld: 10.4 % — ABNORMAL HIGH (ref 4.8–5.6)
Mean Plasma Glucose: 251.78 mg/dL

## 2018-01-30 LAB — LACTIC ACID, PLASMA
LACTIC ACID, VENOUS: 1.1 mmol/L (ref 0.5–1.9)
Lactic Acid, Venous: 1.1 mmol/L (ref 0.5–1.9)

## 2018-01-30 LAB — PROCALCITONIN: Procalcitonin: 0.11 ng/mL

## 2018-01-30 LAB — MRSA PCR SCREENING: MRSA BY PCR: NEGATIVE

## 2018-01-30 LAB — BRAIN NATRIURETIC PEPTIDE: B Natriuretic Peptide: 17.9 pg/mL (ref 0.0–100.0)

## 2018-01-30 MED ORDER — ONDANSETRON HCL 4 MG/2ML IJ SOLN: 4.0000 mg | Freq: Four times a day (QID) | INTRAMUSCULAR | Status: DC | PRN

## 2018-01-30 MED ORDER — RIVAROXABAN 15 MG PO TABS
15.0000 mg | ORAL_TABLET | Freq: Two times a day (BID) | ORAL | Status: DC
  Administered 2018-01-30 – 2018-02-07 (×17): 15 mg via ORAL
  Filled 2018-01-30 (×18): qty 1

## 2018-01-30 MED ORDER — SODIUM CHLORIDE 0.9 % IV SOLN
2.0000 g | INTRAVENOUS | Status: AC
  Administered 2018-01-30: 2 g via INTRAVENOUS
  Filled 2018-01-30: qty 2

## 2018-01-30 MED ORDER — SODIUM CHLORIDE 0.9 % IV BOLUS
30.0000 mL/kg | Freq: Once | INTRAVENOUS | Status: AC
  Administered 2018-01-30: 1000 mL via INTRAVENOUS

## 2018-01-30 MED ORDER — HYDROMORPHONE HCL 2 MG PO TABS: 1.0000 mg | ORAL_TABLET | ORAL | Status: DC | PRN

## 2018-01-30 MED ORDER — POLYETHYLENE GLYCOL 3350 17 G PO PACK
17.0000 g | PACK | Freq: Every day | ORAL | Status: DC | PRN
  Administered 2018-02-04: 17 g via ORAL
  Filled 2018-01-30: qty 1

## 2018-01-30 MED ORDER — SODIUM CHLORIDE 0.9 % IV BOLUS
500.0000 mL | Freq: Once | INTRAVENOUS | Status: AC
  Administered 2018-01-30: 500 mL via INTRAVENOUS

## 2018-01-30 MED ORDER — SORBITOL 70 % SOLN
30.0000 mL | Freq: Every day | Status: DC | PRN
  Administered 2018-02-04: 30 mL via ORAL
  Filled 2018-01-30 (×2): qty 30

## 2018-01-30 MED ORDER — BENZONATATE 100 MG PO CAPS
100.0000 mg | ORAL_CAPSULE | Freq: Three times a day (TID) | ORAL | Status: DC | PRN
  Administered 2018-01-30: 100 mg via ORAL
  Filled 2018-01-30: qty 1

## 2018-01-30 MED ORDER — ROSUVASTATIN CALCIUM 20 MG PO TABS
40.0000 mg | ORAL_TABLET | Freq: Every day | ORAL | Status: DC
  Administered 2018-01-30 – 2018-02-06 (×8): 40 mg via ORAL
  Filled 2018-01-30: qty 2
  Filled 2018-01-30: qty 1
  Filled 2018-01-30 (×7): qty 2

## 2018-01-30 MED ORDER — ACETAMINOPHEN 650 MG RE SUPP: 650.0000 mg | Freq: Four times a day (QID) | RECTAL | Status: DC | PRN

## 2018-01-30 MED ORDER — VANCOMYCIN HCL 10 G IV SOLR: 1250.0000 mg | INTRAVENOUS | Status: DC

## 2018-01-30 MED ORDER — GUAIFENESIN 100 MG/5ML PO SOLN
5.0000 mL | ORAL | Status: DC | PRN
  Administered 2018-01-30 – 2018-01-31 (×2): 100 mg via ORAL
  Filled 2018-01-30 (×2): qty 10

## 2018-01-30 MED ORDER — INSULIN GLARGINE 100 UNIT/ML ~~LOC~~ SOLN
5.0000 [IU] | Freq: Every day | SUBCUTANEOUS | Status: DC
  Administered 2018-01-30 – 2018-02-06 (×8): 5 [IU] via SUBCUTANEOUS
  Filled 2018-01-30 (×9): qty 0.05

## 2018-01-30 MED ORDER — IOPAMIDOL (ISOVUE-370) INJECTION 76%
100.0000 mL | Freq: Once | INTRAVENOUS | Status: AC | PRN
  Administered 2018-01-30: 80 mL via INTRAVENOUS

## 2018-01-30 MED ORDER — ORAL CARE MOUTH RINSE
15.0000 mL | Freq: Two times a day (BID) | OROMUCOSAL | Status: DC
  Administered 2018-01-30 – 2018-02-07 (×14): 15 mL via OROMUCOSAL

## 2018-01-30 MED ORDER — POTASSIUM CHLORIDE CRYS ER 20 MEQ PO TBCR
40.0000 meq | EXTENDED_RELEASE_TABLET | Freq: Once | ORAL | Status: DC
  Filled 2018-01-30: qty 2

## 2018-01-30 MED ORDER — SODIUM CHLORIDE 0.9 % IV SOLN
1.0000 g | Freq: Two times a day (BID) | INTRAVENOUS | Status: DC
  Administered 2018-01-30 – 2018-02-03 (×9): 1 g via INTRAVENOUS
  Filled 2018-01-30 (×10): qty 1

## 2018-01-30 MED ORDER — RIVAROXABAN 10 MG PO TABS: 20.0000 mg | ORAL_TABLET | Freq: Every day | ORAL | Status: DC

## 2018-01-30 MED ORDER — IOPAMIDOL (ISOVUE-370) INJECTION 76%
INTRAVENOUS | Status: AC
  Filled 2018-01-30: qty 100

## 2018-01-30 MED ORDER — SODIUM CHLORIDE 0.9 % IV SOLN
INTRAVENOUS | Status: DC
  Administered 2018-01-31 – 2018-02-05 (×7): via INTRAVENOUS

## 2018-01-30 MED ORDER — HYDROCORTISONE NA SUCCINATE PF 100 MG IJ SOLR
100.0000 mg | Freq: Once | INTRAMUSCULAR | Status: AC
  Administered 2018-01-30: 100 mg via INTRAVENOUS
  Filled 2018-01-30: qty 2

## 2018-01-30 MED ORDER — ACETAMINOPHEN 325 MG PO TABS
650.0000 mg | ORAL_TABLET | Freq: Four times a day (QID) | ORAL | Status: DC | PRN
  Administered 2018-01-31 – 2018-02-06 (×13): 650 mg via ORAL
  Filled 2018-01-30 (×13): qty 2

## 2018-01-30 MED ORDER — INSULIN ASPART 100 UNIT/ML ~~LOC~~ SOLN
0.0000 [IU] | Freq: Three times a day (TID) | SUBCUTANEOUS | Status: DC
  Administered 2018-01-30: 3 [IU] via SUBCUTANEOUS
  Administered 2018-01-30: 5 [IU] via SUBCUTANEOUS
  Administered 2018-01-30: 7 [IU] via SUBCUTANEOUS
  Administered 2018-01-31: 2 [IU] via SUBCUTANEOUS
  Administered 2018-01-31: 1 [IU] via SUBCUTANEOUS
  Administered 2018-02-01: 2 [IU] via SUBCUTANEOUS
  Administered 2018-02-01: 1 [IU] via SUBCUTANEOUS
  Administered 2018-02-02 – 2018-02-03 (×3): 2 [IU] via SUBCUTANEOUS
  Administered 2018-02-04 (×3): 1 [IU] via SUBCUTANEOUS
  Administered 2018-02-06: 3 [IU] via SUBCUTANEOUS
  Administered 2018-02-06: 5 [IU] via SUBCUTANEOUS
  Administered 2018-02-06: 9 [IU] via SUBCUTANEOUS
  Administered 2018-02-07 (×2): 3 [IU] via SUBCUTANEOUS

## 2018-01-30 MED ORDER — VANCOMYCIN HCL 10 G IV SOLR
1500.0000 mg | Freq: Once | INTRAVENOUS | Status: AC
  Administered 2018-01-30: 1500 mg via INTRAVENOUS
  Filled 2018-01-30: qty 1500

## 2018-01-30 MED ORDER — ZOLPIDEM TARTRATE 5 MG PO TABS: 5.0000 mg | ORAL_TABLET | Freq: Every evening | ORAL | Status: DC | PRN

## 2018-01-30 MED ORDER — BISACODYL 10 MG RE SUPP: 10.0000 mg | Freq: Every day | RECTAL | Status: DC | PRN

## 2018-01-30 MED ORDER — ONDANSETRON HCL 4 MG PO TABS: 4.0000 mg | ORAL_TABLET | Freq: Four times a day (QID) | ORAL | Status: DC | PRN

## 2018-01-30 MED ORDER — SODIUM CHLORIDE (PF) 0.9 % IJ SOLN
INTRAMUSCULAR | Status: AC
  Filled 2018-01-30: qty 50

## 2018-01-30 MED ORDER — POTASSIUM CHLORIDE 20 MEQ/15ML (10%) PO SOLN
40.0000 meq | Freq: Once | ORAL | Status: AC
  Administered 2018-01-30: 40 meq via ORAL
  Filled 2018-01-30: qty 30

## 2018-01-30 MED ORDER — HYDROCOD POLST-CPM POLST ER 10-8 MG/5ML PO SUER
5.0000 mL | Freq: Two times a day (BID) | ORAL | Status: DC | PRN
  Administered 2018-01-30: 5 mL via ORAL
  Filled 2018-01-30: qty 5

## 2018-01-30 NOTE — Progress Notes (Signed)
Attempted to call report to 4 [facility] Nurse. Secretary gave me the phone number to [employer] Jess RN did not answer and secretary advised me that Keane Scrape would call me back. Will attempt to call again.  Mariann Laster, RN

## 2018-01-30 NOTE — Progress Notes (Signed)
Radiation Oncology         [phone] ________________________________  Name: Elizabeth Calhoun MRN: [id number]  Date: 01/30/2018  DOB: October 04, 1945  Follow-Up Visit Note  CC: Seward Carol, MD  No ref. provider found   Diagnosis:   73 y.o. female with  stage IV primary lung cancer presenting in the apex of the left lung (adenocarcinoma)  Interval Since Last Radiation:  2 weeks   Site/dose:    1. Left Lung / 35 Gy in 14 fractions 2. Lumbar Spine / 35 Gy in 14 fractions  Beams/energy:    1. 3D / 10X, 15X, 6X Photon 2. Isodose Plan / 10X, 15X Photon  Narrative: patient was recently admitted to the hospital with generalized weakness poor appetite fever and chills and confusion. Temperature at home was 102. the patient was hypotensive upon presentation to the emergency room. Chest CT is documented below shows bilateral groundglass and nodular densities consistent with edema or pneumonia.  Radiation therapy is been consulted for evaluation to see if this might possibly be radiation pneumonitis. I carefully reviewed the patient's recent CT scan as it relates to her palliative radiation therapy directed to the left peri- hilar mass. Changes seen on the patient's chest CT scan are not consistent with radiation pneumonitis.  She received a modest dose of radiation therapy (35 gray) with a small radiation field directed at the left suprahilar mass. None of the right lung was treated with her radiation therapy.                           Radiographic Findings: Dg Chest 2 View  Result Date: 01/29/2018 CLINICAL DATA:  Loss of appetite. Stage IV lung cancer. EXAM: CHEST - 2 VIEW COMPARISON:  01/19/2018 CT FINDINGS: Low lung volumes. Borderline cardiomegaly with aortic atherosclerosis. Known spiculated AP window nodal mass as seen on prior CT measuring up to 2.3 cm radiographically likely accounts for the pulmonary opacity seen in the region of the AP window. Central vascular congestion is  otherwise noted bilaterally. No aggressive osseous lesions. Osteoarthritis of the [facility] and glenohumeral joints. IMPRESSION: Low lung volumes with borderline cardiomegaly and aortic atherosclerosis. Mild central vascular congestion is noted. Spiculated masslike opacity in the AP window compatible with known nodal mass on recent CT. Electronically Signed   By: Ashley Royalty M.D.   On: 01/29/2018 22:29   Dg Chest 2 View  Result Date: 01/19/2018 CLINICAL DATA:  Shortness of breath. EXAM: CHEST - 2 VIEW COMPARISON:  PET scan of December 10, 2017. Radiographs of December 07, 2017. FINDINGS: Stable cardiac silhouette. Left hilar spiculated abnormality is noted consistent with malignancy as described on PET scan. No pneumothorax is noted. No pleural effusion is noted. Elevated left hemidiaphragm is noted. Right lung is clear. No acute consolidative process is noted. Bony thorax is unremarkable. IMPRESSION: Stable left hilar spiculated density is noted consistent with malignancy as described on PET scan. No significant changes noted compared to prior exam. Electronically Signed   By: Marijo Conception, M.D.   On: 01/19/2018 14:02   Ct Angio Chest Pe W And/or Wo Contrast  Result Date: 01/30/2018 CLINICAL DATA:  73 year old female with failure to thrive. EXAM: CT ANGIOGRAPHY CHEST WITH CONTRAST TECHNIQUE: Multidetector CT imaging of the chest was performed using the standard protocol during bolus administration of intravenous contrast. Multiplanar CT image reconstructions and MIPs were obtained to evaluate the vascular anatomy. CONTRAST:  29mL ISOVUE-370 IOPAMIDOL (ISOVUE-370) INJECTION 76% COMPARISON:  Chest radiograph dated 01/29/2018 and CT dated 01/19/2018 FINDINGS: Cardiovascular: There is mild cardiomegaly. No pericardial effusion. There is multi vessel coronary vascular calcification. Mild atherosclerotic calcification of the thoracic aorta. No aneurysmal dilatation or evidence of dissection. There is mild dilatation  of the main pulmonary trunk suggestive of a degree of pulmonary hypertension. Clinical correlation is recommended. Residual embolus noted in the right lower lobe segmental branch pulmonary artery as well as in the right upper lobe segmental branch. No new pulmonary artery embolus identified. There is high-grade narrowing of the left main pulmonary artery secondary to hilar mass/metastasis. Mediastinum/Nodes: Spiculated left hilar/suprahilar mass/metastasis measuring 3.8 x 3.0 cm similar to prior CT. There is encasement and narrowing of the left main pulmonary artery. There is diffuse thickening of the left mediastinal pleural surface. Right paratracheal lymph node measures 9 mm short axis. No mediastinal fluid collection. Lungs/Pleura: A 2.7 x 2.6 cm left apical spiculated mass as well as smaller adjacent nodular densities, likely satellite lesions. There is diffuse ground-glass airspace density and nodularity right greater left, new or worsened since the prior CT. The rapid progression since the prior CT suggest an acute etiology such as edema or pneumonia. Lymphangitic spread of tumor is considered less likely given rapid interval development. Clinical correlation and follow-up recommended. No large pleural effusion. There is no pneumothorax. The central airways are patent. Upper Abdomen: No acute abnormality. Musculoskeletal: Osteopenia with degenerative changes of the spine. Diffuse sclerotic lesions throughout the spine most consistent with metastatic disease. There is a lytic lesion involving the anterior half of T11 consistent with metastatic disease. Additional lytic metastatic disease involving the left scapula as seen previously. No acute pathologic fracture. Review of the MIP images confirms the above findings. IMPRESSION: 1. Interval development of bilateral ground-glass and nodular density most consistent with edema or pneumonia. Clinical correlation is recommended. 2. Left apical malignancy with nodal  and osseous metastatic disease as seen previously. 3. Encasement and high-grade narrowing of the left main pulmonary artery by the hilar mass/metastatic node. 4. Residual pulmonary artery emboli similar to prior CT.  No new PE. Electronically Signed   By: Anner Crete M.D.   On: 01/30/2018 02:24   Ct Angio Chest Pe W/cm &/or Wo Cm  Result Date: 01/19/2018 CLINICAL DATA:  Worsening shortness of breath over the past few days. Recently diagnosed with metastatic lung cancer. EXAM: CT ANGIOGRAPHY CHEST WITH CONTRAST TECHNIQUE: Multidetector CT imaging of the chest was performed using the standard protocol during bolus administration of intravenous contrast. Multiplanar CT image reconstructions and MIPs were obtained to evaluate the vascular anatomy. CONTRAST:  99mL ISOVUE-370 IOPAMIDOL (ISOVUE-370) INJECTION 76% COMPARISON:  PET-CT dated December 10, 2017. CT chest dated December 07, 2017. FINDINGS: Cardiovascular: Acute segmental pulmonary emboli within the right upper and lower lobes. No central or lobar pulmonary emboli. Elevated RV/LV ratio of 1.6. No pericardial effusion. No thoracic aortic aneurysm or dissection. Coronary, aortic arch, and branch vessel atherosclerotic vascular disease. Mediastinum/Nodes: Unchanged aortopulmonary window nodal mass measuring 3.8 x 4.0 cm, this again completely encases the left pulmonary artery and left upper lobe bronchus. Other scattered mediastinal lymph nodes measuring up to 1 cm in short axis are unchanged. Stable to slightly decreased left hilar lymph node measuring 1.3 cm, previously 1.5 cm. No axillary right hilar lymphadenopathy. The thyroid gland, trachea, and esophagus demonstrate no significant findings. Lungs/Pleura: Unchanged 3.1 cm mass in the left apical of lobe. Additional scattered nodules in the left upper lobe are also unchanged. Stable multifocal pleural nodularity  in the left hemithorax. No pleural effusion or pneumothorax. Upper Abdomen: No acute  abnormality. Musculoskeletal: Unchanged lytic metastases involving the left scapula, left T2 pedicle and transverse process, and T11 vertebral body. Review of the MIP images confirms the above findings. IMPRESSION: 1. Acute segmental pulmonary emboli within the right upper and lower lobes. Evidence of right heart strain. 2. Unchanged 3.1 cm left apical lung mass with left upper lobe, left pleural, nodal, and osseous metastatic disease. The 4.0 cm aortopulmonary window nodal mass severely narrows the left pulmonary artery and left upper lobe bronchus. 3. Aortic atherosclerosis (ICD10-I70.0). Critical Value/emergent results were called by telephone at the time of interpretation on 01/19/2018 at 5:17 pm to Dr. Sherry Ruffing, who verbally acknowledged these results. Electronically Signed   By: Titus Dubin M.D.   On: 01/19/2018 17:26      Plan:  Patient will keep her already scheduled outpatient follow-up in radiation oncology  ____________________________________ Gery Pray, MD

## 2018-01-30 NOTE — Care Management Note (Signed)
Case Management Note  Patient Details  Name: Kynlie Jane MRN: [id number] Date of Birth: September 15, 1945  Subjective/Objective:                  Return to top of Pneumonia [facility] - [place]  Discharge readiness is indicated by patient meeting Recovery Milestones, including ALL of the following: ? Hemodynamic stability hypotensive rec'd 2.5 liter of ns/then 107/61 presently 82/48 ? Tachypnea absent heart rate 100 ? Hypoxemia absent  No on o2 at 2liter desat to 88% ? Afebrile, or temperature acceptable for next level of care no ? Oxygen absent or at baseline need ? Mental status at baseline no ? Antibiotic regimen acceptable for next level of care ? Iv ns at 125cc/hr,iv maxipime,iv vanco ? Ambulatory no ? Level of care sdu   Action/Plan: Will follow for progression of care and clinical status. Will follow for case management needs none present at this time.  Expected Discharge Date:  (unknown)               Expected Discharge Plan:     In-House Referral:     Discharge planning Services     Post Acute Care Choice:    Choice offered to:     DME Arranged:    DME Agency:     HH Arranged:    HH Agency:     Status of Service:     If discussed at [employer], dates discussed:    Additional Comments:  Leeroy Cha, RN 01/30/2018, 10:45 AM

## 2018-01-30 NOTE — H&P (Signed)
History and Physical    Elizabeth Calhoun CXK:[id number] DOB: [birth year] DOA: 01/29/2018  Referring MD/NP/PA:   PCP: Seward Carol, MD   Patient coming from:  The patient is coming from home.  At baseline, pt is partially dependent for most of ADL.        Chief Complaint: Generalized weakness, poor appetite, fever and chills, mild confusion.  HPI: Elizabeth Calhoun is a 73 y.o. female with medical history significant of hypertension, hyperlipidemia, diabetes mellitus, stroke, recently diagnosed PE on Xarelto, metastatic lung cancer (s/p of palliative radiation therapy), who presents with generalized weakness, poor appetite, fever and chills, mild confusion.  Per her daughters, in the past several days, patient was noted to have generalized weakness, poor appetite and decreased oral intake.  She also has fever and chills.  She had temperature 102 at one time at home.  She is mildly confused, but still oriented x3 when I saw patient in the ED.  Patient has dry cough, but no shortness of breath, chest pain, runny nose or sore throat.  Patient does not have nausea, vomiting, diarrhea, abdominal pain, symptoms of UTI or unilateral weakness.  Patient was initially hypotensive with blood pressure 82/48, which improved to SBP>100 after 2.5 L of NS bolus in ED  ED Course: pt was found to have WBC 8.0, INR 4.93, urinalysis not impressive, negative flu PCR, sodium 128, creatinine and BUN normal, temperature 101.9, tachycardia, tachypnea, oxygen saturation 87% on room air, chest x-ray showed mild vascular congestion and low volume, no obvious infiltration.  Patient is admitted to stepdown as inpatient.  CAT of chest showed: 1. Interval development of bilateral ground-glass and nodular density most consistent with edema or pneumonia.  2. Left apical malignancy with nodal and osseous metastatic disease as seen previously. 3. Encasement and high-grade narrowing of the left main pulmonary artery by the hilar  mass/metastatic node. 4. Residual pulmonary artery emboli similar to prior CT.  No new PE.  Review of Systems:   General: has fevers, chills, no body weight gain, has poor appetite, has fatigue HEENT: no blurry vision, hearing changes or sore throat Respiratory: no dyspnea, has coughing, no wheezing CV: no chest pain, no palpitations GI: no nausea, vomiting, abdominal pain, diarrhea, constipation GU: no dysuria, burning on urination, increased urinary frequency, hematuria  Ext: no leg edema Neuro: no unilateral weakness, numbness, or tingling, no vision change or hearing loss. Has mild confusion. Skin: no rash, no skin tear. MSK: No muscle spasm, no deformity, no limitation of range of movement in spin Heme: No easy bruising.  Travel history: No recent long distant travel.  Allergy:  Allergies  Allergen Reactions  . Aspirin Other (See Comments)    Causes lethargy    Past Medical History:  Diagnosis Date  . Adenomatous colon polyp   . Cancer ([place])   . Diabetes mellitus without complication ([place])   . Hemorrhoids   . Menopause   . OA (osteoarthritis)   . Obesity   . Other and unspecified hyperlipidemia   . Spine metastasis ([place])   . Stage 4 lung cancer ([place])   . Stroke [facility])     Past Surgical History:  Procedure Laterality Date  . HYSTERECTOMY ABDOMINAL WITH SALPINGO-OOPHORECTOMY  1990  . KNEE ARTHROSCOPY Left 2009  . SHOULDER SURGERY  2005    Social History:  reports that she has never smoked. She has never used smokeless tobacco. She reports previous alcohol use. She reports that she does not use drugs.  Family History:  Family History  Problem Relation Age of Onset  . Heart attack Father   . Heart disease Brother        1/8  . Heart disease Brother        2/8  . Heart disease Brother        3/8  . Heart disease Brother        4/8  . Heart disease Brother        5/8  . Heart disease Brother        6/8  . Cancer Brother        7/8  . Seizures Brother          8/8  . Stroke Brother        8/8  . Heart disease Sister        1/6  . Alzheimer's disease Sister        2/6  . Diabetes Sister        3/6  . Hypertension Sister        3/6  . Hypertension Sister        4/6  . Diabetes Sister        4/6  . Diabetes Daughter      Prior to Admission medications   Medication Sig Start Date End Date Taking? Authorizing Provider  benzonatate (TESSALON) 100 MG capsule Take 1 capsule (100 mg total) by mouth 3 (three) times daily as needed for cough. 11/28/17  Yes Brunetta Genera, MD  fentaNYL (DURAGESIC) 12 MCG/HR Place 1 patch onto the skin every 3 (three) days. 01/24/18  Yes Brunetta Genera, MD  hydrochlorothiazide (HYDRODIURIL) 25 MG tablet Take 25 mg by mouth daily. 10/09/16  Yes [provider]  HYDROmorphone (DILAUDID) 2 MG tablet Take 1-2 tablets (2-4 mg total) by mouth every 4 (four) hours as needed for severe pain. 12/10/17  Yes Brunetta Genera, MD  insulin degludec (TRESIBA FLEXTOUCH) 100 UNIT/ML SOPN FlexTouch Pen Inject 10 Units into the skin at bedtime.   Yes [provider]  losartan (COZAAR) 25 MG tablet Take 25 mg by mouth daily.   Yes [provider]  ondansetron (ZOFRAN ODT) 4 MG disintegrating tablet Take 1 tablet (4 mg total) by mouth every 8 (eight) hours as needed for nausea or vomiting. 12/07/17  Yes Long, Wonda Olds, MD  Rivaroxaban 15 & 20 MG TBPK Take as directed on package: Start with one 15mg  tablet by mouth twice a day with food. On Day 22, switch to one 20mg  tablet once a day with food. 01/21/18  Yes Arrien, Jimmy Picket, MD  rosuvastatin (CRESTOR) 40 MG tablet Take 40 mg by mouth daily. 10/01/16  Yes [provider]  zolpidem (AMBIEN) 10 MG tablet Take 10 mg by mouth at bedtime.    Yes [provider]  polyethylene glycol (MIRALAX / GLYCOLAX) packet Take 17 g by mouth daily for 30 days. 01/21/18 02/20/18  Arrien, Jimmy Picket, MD  sorbitol 70 % SOLN Take 30 mLs by  mouth daily as needed for severe constipation. 01/21/18   Tawni Millers, MD    Physical Exam: Vitals:   01/30/18 0230 01/30/18 0400 01/30/18 0430 01/30/18 0600  BP: (!) 88/58 128/70  (!) 106/57  Pulse: 87 91 95 94  Resp: 19 (!) 28 (!) 24 (!) 27  Temp:   98.2 F (36.8 C)   TempSrc:      SpO2: (!) 88% 91% 91% 94%   General: Not in acute distress HEENT:  Eyes: PERRL, EOMI, no scleral icterus.       ENT: No discharge from the ears and nose, no pharynx injection, no tonsillar enlargement.        Neck: No JVD, no bruit, no mass felt. Heme: No neck lymph node enlargement. Cardiac: S1/S2, RRR, No murmurs, No gallops or rubs. Respiratory: No rales, wheezing, rhonchi or rubs. GI: Soft, nondistended, nontender, no rebound pain, no organomegaly, BS present. GU: No hematuria Ext: No pitting leg edema bilaterally. 2+DP/PT pulse bilaterally. Musculoskeletal: No joint deformities, No joint redness or warmth, no limitation of ROM in spin. Skin: No rashes.  Neuro: mild confusion, but is oriented X3, cranial nerves II-XII grossly intact, moves all extremities normally.  Psych: Patient is not psychotic, no suicidal or hemocidal ideation.  Labs on Admission: I have personally reviewed following labs and imaging studies  CBC: Recent Labs  Lab 01/29/18 2150 01/30/18 0545  WBC 8.0 5.0  NEUTROABS 5.7  --   HGB 9.2* 8.7*  HCT 28.6* 27.0*  MCV 91.7 93.1  PLT 288 242   Basic Metabolic Panel: Recent Labs  Lab 01/29/18 2150 01/30/18 0545  NA 128* 131*  K 3.7 3.4*  CL 96* 103  CO2 23 20*  GLUCOSE 197* 216*  BUN 17 14  CREATININE 0.91 0.83  CALCIUM 8.0* 7.2*   GFR: Estimated Creatinine Clearance: 50.8 mL/min (by C-G formula based on SCr of 0.83 mg/dL). Liver Function Tests: Recent Labs  Lab 01/29/18 2150  AST 28  ALT 19  ALKPHOS 119  BILITOT 0.8  PROT 6.4*  ALBUMIN 2.9*   No results for input(s): LIPASE, AMYLASE in the last 168 hours. No results for input(s):  AMMONIA in the last 168 hours. Coagulation Profile: Recent Labs  Lab 01/29/18 2150 01/30/18 0545  INR 4.93* 3.81   Cardiac Enzymes: No results for input(s): CKTOTAL, CKMB, CKMBINDEX, TROPONINI in the last 168 hours. BNP (last 3 results) No results for input(s): PROBNP in the last 8760 hours. HbA1C: No results for input(s): HGBA1C in the last 72 hours. CBG: No results for input(s): GLUCAP in the last 168 hours. Lipid Profile: No results for input(s): CHOL, HDL, LDLCALC, TRIG, CHOLHDL, LDLDIRECT in the last 72 hours. Thyroid Function Tests: No results for input(s): TSH, T4TOTAL, FREET4, T3FREE, THYROIDAB in the last 72 hours. Anemia Panel: No results for input(s): VITAMINB12, FOLATE, FERRITIN, TIBC, IRON, RETICCTPCT in the last 72 hours. Urine analysis:    Component Value Date/Time   COLORURINE YELLOW 01/30/2018 0441   APPEARANCEUR HAZY (A) 01/30/2018 0441   LABSPEC 1.020 01/30/2018 0441   PHURINE 5.0 01/30/2018 0441   GLUCOSEU NEGATIVE 01/30/2018 0441   HGBUR SMALL (A) 01/30/2018 0441   BILIRUBINUR NEGATIVE 01/30/2018 0441   KETONESUR NEGATIVE 01/30/2018 0441   PROTEINUR NEGATIVE 01/30/2018 0441   UROBILINOGEN 0.2 01/10/2010 1749   NITRITE NEGATIVE 01/30/2018 0441   LEUKOCYTESUR NEGATIVE 01/30/2018 0441   Sepsis Labs: @LABRCNTIP (procalcitonin:4,lacticidven:4) ) Recent Results (from the past 240 hour(s))  Culture, blood (Routine x 2)     Status: None (Preliminary result)   Collection Time: 01/29/18  9:50 PM  Result Value Ref Range Status   Specimen Description   Final    BLOOD RIGHT FOREARM Performed at [facility], [place] [address], [place], [place] [address]    Special Requests   Final    BOTTLES DRAWN AEROBIC AND ANAEROBIC Blood Culture adequate volume Performed at [facility] [address], [place], [place] [address]    Culture PENDING  Incomplete   Report Status PENDING  Incomplete     Radiological Exams on Admission: Dg Chest 2  View  Result Date: 01/29/2018 CLINICAL DATA:  Loss of appetite. Stage IV lung cancer. EXAM: CHEST - 2 VIEW COMPARISON:  01/19/2018 CT FINDINGS: Low lung volumes. Borderline cardiomegaly with aortic atherosclerosis. Known spiculated AP window nodal mass as seen on prior CT measuring up to 2.3 cm radiographically likely accounts for the pulmonary opacity seen in the region of the AP window. Central vascular congestion is otherwise noted bilaterally. No aggressive osseous lesions. Osteoarthritis of the [facility] and glenohumeral joints. IMPRESSION: Low lung volumes with borderline cardiomegaly and aortic atherosclerosis. Mild central vascular congestion is noted. Spiculated masslike opacity in the AP window compatible with known nodal mass on recent CT. Electronically Signed   By: Ashley Royalty M.D.   On: 01/29/2018 22:29   Ct Angio Chest Pe W And/or Wo Contrast  Result Date: 01/30/2018 CLINICAL DATA:  73 year old female with failure to thrive. EXAM: CT ANGIOGRAPHY CHEST WITH CONTRAST TECHNIQUE: Multidetector CT imaging of the chest was performed using the standard protocol during bolus administration of intravenous contrast. Multiplanar CT image reconstructions and MIPs were obtained to evaluate the vascular anatomy. CONTRAST:  27mL ISOVUE-370 IOPAMIDOL (ISOVUE-370) INJECTION 76% COMPARISON:  Chest radiograph dated 01/29/2018 and CT dated 01/19/2018 FINDINGS: Cardiovascular: There is mild cardiomegaly. No pericardial effusion. There is multi vessel coronary vascular calcification. Mild atherosclerotic calcification of the thoracic aorta. No aneurysmal dilatation or evidence of dissection. There is mild dilatation of the main pulmonary trunk suggestive of a degree of pulmonary hypertension. Clinical correlation is recommended. Residual embolus noted in the right lower lobe segmental branch pulmonary artery as well as in the right upper lobe segmental branch. No new pulmonary artery embolus identified. There is high-grade  narrowing of the left main pulmonary artery secondary to hilar mass/metastasis. Mediastinum/Nodes: Spiculated left hilar/suprahilar mass/metastasis measuring 3.8 x 3.0 cm similar to prior CT. There is encasement and narrowing of the left main pulmonary artery. There is diffuse thickening of the left mediastinal pleural surface. Right paratracheal lymph node measures 9 mm short axis. No mediastinal fluid collection. Lungs/Pleura: A 2.7 x 2.6 cm left apical spiculated mass as well as smaller adjacent nodular densities, likely satellite lesions. There is diffuse ground-glass airspace density and nodularity right greater left, new or worsened since the prior CT. The rapid progression since the prior CT suggest an acute etiology such as edema or pneumonia. Lymphangitic spread of tumor is considered less likely given rapid interval development. Clinical correlation and follow-up recommended. No large pleural effusion. There is no pneumothorax. The central airways are patent. Upper Abdomen: No acute abnormality. Musculoskeletal: Osteopenia with degenerative changes of the spine. Diffuse sclerotic lesions throughout the spine most consistent with metastatic disease. There is a lytic lesion involving the anterior half of T11 consistent with metastatic disease. Additional lytic metastatic disease involving the left scapula as seen previously. No acute pathologic fracture. Review of the MIP images confirms the above findings. IMPRESSION: 1. Interval development of bilateral ground-glass and nodular density most consistent with edema or pneumonia. Clinical correlation is recommended. 2. Left apical malignancy with nodal and osseous metastatic disease as seen previously. 3. Encasement and high-grade narrowing of the left main pulmonary artery by the hilar mass/metastatic node. 4. Residual pulmonary artery emboli similar to prior CT.  No new PE. Electronically Signed   By: Anner Crete M.D.   On: 01/30/2018 02:24     EKG:  Independently reviewed.  Sinus  rhythm, QTC 458, low voltage, early R wave progression, nonspecific T wave change.  Assessment/Plan Principal Problem:   Sepsis ([place]) Active Problems:   Bone metastasis ([place])   Pulmonary embolism ([facility])   Lung cancer ([facility])   HTN (hypertension)   HLD (hyperlipidemia)   Hyponatremia   Stroke ([place])   Normocytic anemia   Diabetes mellitus without complication ([place])   Acute metabolic encephalopathy   Sepsis [facility]): Patient admitted critical for sepsis with fever, tachycardia, tachypnea, hypotension.  Blood pressure responded to IV fluid and is stabilized now.  Pending lactic acid.  Etiology is not clear.  Urinalysis not impressive.  Chest x-ray did not show clear infiltration.  -will admit to [facility] as inpt -Vancomycin and cefepime empirically -give one dose of Cortef 100 mg empirically. -f/u Bx and Ux -will get Procalcitonin and trend lactic acid levels per sepsis protocol. -IVF: 3.0L of NS bolus in ED, followed by 125 cc/h   Plmonary embolism ([place]): -on Xarelto  Lung cancer with bone metastasis: per previous discharge summary, "She does have a large left upper lobe nodule, most consistent with bronchogenic carcinoma, left suprahilar nodal mass which was obstructing the left upper lobe bronchus.  Positive distant skeletal metastasis involving the proximal femur, sacrum and multiple lesions in thoracic lumbar spine, ribs and scapula.  She has received palliative radiation therapy, and a cervical lymph node biopsy positive for adenocarcinoma". Pt is followed by pt Dr. [name]. -will add Dr. [name] to treatment tea,  Essential hypertension: -IV Hydralazine prn -hold HTN due to hypotension  HLD (hyperlipidemia): -crestor  Hyponatremia: Na 128.  Likely multifactorial etiology, including decreased oral intake, continuation of HCTZ and Cozaar. -IV sodium chloride as above  Stroke [facility]): -on Crestor  Normocytic anemia: Hgb stable at 9.3 on 01/21/2018-->  9.2. -Follow-up with CBC  Diabetes mellitus without complication ([place]):  Last A1c 9.0, poorly controled. Patient is taking Lantus at home -will decrease Lantus dose from 10 to 5 unit daily -SSI  Acute metabolic encephalopathy: Patient has mild confusion, but is still orientated x3.  Likely due to sepsis. -Frequent neuro checks.      Inpatient status:  # Patient requires inpatient status due to high intensity of service, high risk for further deterioration and high frequency of surveillance required.  I certify that at the point of admission it is my clinical judgment that the patient will require inpatient hospital care spanning beyond 2 midnights from the point of admission.  . This patient has multiple chronic comorbidities including hypertension, hyperlipidemia, diabetes mellitus, stroke, recently diagnosed PE on Xarelto, metastatic lung cancer (s/p of palliative radiation therapy) . Now patient has presenting symptoms include generalized weakness, poor appetite, fever and chills, mild confusion. . The worrisome physical exam findings include fever, altered mental status, oxygen desaturation . The initial radiographic and laboratory data are worrisome because of hyponatremia, vascular congestion on chest x-ray and abnormal findings on CT angiogram. . Current medical needs: please see my assessment and plan . Predictability of an adverse outcome (risk): Patient has multiple comorbidities, including stage IV metastasized lung cancer, who presents with severe sepsis with hypotension, source of infection is not identified yet.  Patient is at high risk for deteriorating.  Will need to stay in hospital for at least 2 days.      DVT ppx: on Xarelto Code Status: Full code Family Communication:  Yes, patient's 3 daughters    at bed side Disposition Plan:  Anticipate discharge back to previous home environment Consults called:  none  Admission status:  SDU/inpation       Date of Service  01/30/2018    Ivor Costa [facility]   If 7PM-7AM, please contact night-coverage [email or web address] Password [email or web address] 01/30/2018, 7:01 AM

## 2018-01-30 NOTE — Consult Note (Signed)
Consultation Note Date: 01/30/2018   Patient Name: Elizabeth Calhoun  DOB: 21-Mar-1945  MRN: [id number]  Age / Sex: 73 y.o., female  PCP: Seward Carol, MD Referring Physician: Kayleen Memos, DO  Reason for Consultation: Establishing goals of care  HPI/Patient Profile: 73 y.o. female    admitted on 01/29/2018    Clinical Assessment and Goals of Care:  73 year old lady with hypertension dyslipidemia diabetes, stroke, recently diagnosed pulmonary embolism, metastatic lung cancer status post  Radiation therapy.  Patient lives at home by herself, 2 daughters check in on her and are present frequently. Family members state that the patient started having confusion and high fever, 102F and minimal to nil oral intake at home for the past 24 hours leading up to her arrival to the hospital. Prior to that, for the past week, the patient has been having decreased energy.  The patient is admitted to stepdown unit, under hospital medicine service for sepsis, she has been started on broad-spectrum antibiotics. CT scan was done at the time of admission. Medical oncology input has been requested.  A palliative medicine consult has been requested for goals of care discussions.   The patient is resting in bed, her 2 daughters are present at the bedside. I introduced myself and palliative care as follows: Palliative medicine is specialized medical care for people living with serious illness. It focuses on providing relief from the symptoms and stress of a serious illness. The goal is to improve quality of life for both the patient and the family.  The patient's daughter stated, "hold on, we are not giving up. Please explain why you are here." discussed about purpose of initial palliative consultation being to offer additional layer of support and to help address any symptom management needs.   We talked about current and home  pain regimen as well as bowel regimen, patient complains of constipation.   The patient becomes tearful when she talked about her son who was killed in a car accident several years ago.   Offered active listening and supportive care, see below.   NEXT OF KIN  2 daughters.   SUMMARY OF RECOMMENDATIONS    full code, full scope Monitor and adjust pain and non pain symptom management medications: regimen noted, continue trans dermal fentanyl patch, and other prn medications.  Family and patient awaiting further follow up from med onc.  Thank you for the consult.   Code Status/Advance Care Planning:  Full code    Symptom Management:    as above   Palliative Prophylaxis:   Bowel Regimen  Additional Recommendations (Limitations, Scope, Preferences):  Full Scope Treatment  Psycho-social/Spiritual:   Desire for further Chaplaincy support:yes  Additional Recommendations: Caregiving  Support/Resources  Prognosis:   Unable to determine  Discharge Planning: To Be Determined      Primary Diagnoses: Present on Admission: . Pulmonary embolism ([place]) . Bone metastasis ([place]) . Lung cancer ([place]) . HTN (hypertension) . HLD (hyperlipidemia) . Hyponatremia . Stroke ([place]) . Sepsis ([place]) . Normocytic anemia . Acute metabolic  encephalopathy   I have reviewed the medical record, interviewed the patient and family, and examined the patient. The following aspects are pertinent.  Past Medical History:  Diagnosis Date  . Adenomatous colon polyp   . Cancer ([place])   . Diabetes mellitus without complication ([place])   . Hemorrhoids   . Menopause   . OA (osteoarthritis)   . Obesity   . Other and unspecified hyperlipidemia   . Spine metastasis ([place])   . Stage 4 lung cancer ([place])   . Stroke [facility])    Social History   Socioeconomic History  . Marital status: Widowed    Spouse name: Not on file  . Number of children: 3  . Years of education: Not on file  . Highest education  level: Not on file  Occupational History  . Occupation: works at [employer]  . Financial resource strain: Not hard at all  . Food insecurity:    Worry: Patient refused    Inability: Patient refused  . Transportation needs:    Medical: Patient refused    Non-medical: Patient refused  Tobacco Use  . Smoking status: Never Smoker  . Smokeless tobacco: Never Used  Substance and Sexual Activity  . Alcohol use: Not Currently    Comment: occasionally  . Drug use: No  . Sexual activity: Not on file  Lifestyle  . Physical activity:    Days per week: Patient refused    Minutes per session: Patient refused  . Stress: Only a little  Relationships  . Social connections:    Talks on phone: More than three times a week    Gets together: Not on file    Attends religious service: More than 4 times per year    Active member of club or organization: Yes    Attends meetings of clubs or organizations: More than 4 times per year    Relationship status: Widowed  Other Topics Concern  . Not on file  Social History Narrative  . Not on file   Family History  Problem Relation Age of Onset  . Heart attack Father   . Heart disease Brother        1/8  . Heart disease Brother        2/8  . Heart disease Brother        3/8  . Heart disease Brother        4/8  . Heart disease Brother        5/8  . Heart disease Brother        6/8  . Cancer Brother        7/8  . Seizures Brother        8/8  . Stroke Brother        8/8  . Heart disease Sister        1/6  . Alzheimer's disease Sister        2/6  . Diabetes Sister        3/6  . Hypertension Sister        3/6  . Hypertension Sister        4/6  . Diabetes Sister        4/6  . Diabetes Daughter    Scheduled Meds: . insulin aspart  0-9 Units Subcutaneous TID WC  . insulin glargine  5 Units Subcutaneous QHS  . iopamidol      . mouth rinse  15 mL Mouth Rinse BID  . Rivaroxaban  15  mg Oral BID WC  . [START ON 02/11/2018]  rivaroxaban  20 mg Oral Q supper  . rosuvastatin  40 mg Oral q1800  . sodium chloride (PF)       Continuous Infusions: . sodium chloride    . ceFEPime (MAXIPIME) IV Stopped (01/30/18 0954)  . [START ON 01/31/2018] vancomycin     PRN Meds:.acetaminophen **OR** acetaminophen, benzonatate, HYDROmorphone, ondansetron **OR** ondansetron (ZOFRAN) IV, polyethylene glycol, sorbitol, zolpidem Medications Prior to Admission:  Prior to Admission medications   Medication Sig Start Date End Date Taking? Authorizing Provider  benzonatate (TESSALON) 100 MG capsule Take 1 capsule (100 mg total) by mouth 3 (three) times daily as needed for cough. 11/28/17  Yes Brunetta Genera, MD  fentaNYL (DURAGESIC) 12 MCG/HR Place 1 patch onto the skin every 3 (three) days. 01/24/18  Yes Brunetta Genera, MD  hydrochlorothiazide (HYDRODIURIL) 25 MG tablet Take 25 mg by mouth daily. 10/09/16  Yes [provider]  HYDROmorphone (DILAUDID) 2 MG tablet Take 1-2 tablets (2-4 mg total) by mouth every 4 (four) hours as needed for severe pain. 12/10/17  Yes Brunetta Genera, MD  insulin degludec (TRESIBA FLEXTOUCH) 100 UNIT/ML SOPN FlexTouch Pen Inject 10 Units into the skin at bedtime.   Yes [provider]  losartan (COZAAR) 25 MG tablet Take 25 mg by mouth daily.   Yes [provider]  ondansetron (ZOFRAN ODT) 4 MG disintegrating tablet Take 1 tablet (4 mg total) by mouth every 8 (eight) hours as needed for nausea or vomiting. 12/07/17  Yes Long, Wonda Olds, MD  Rivaroxaban 15 & 20 MG TBPK Take as directed on package: Start with one 15mg  tablet by mouth twice a day with food. On Day 22, switch to one 20mg  tablet once a day with food. 01/21/18  Yes Arrien, Jimmy Picket, MD  rosuvastatin (CRESTOR) 40 MG tablet Take 40 mg by mouth daily. 10/01/16  Yes [provider]  zolpidem (AMBIEN) 10 MG tablet Take 10 mg by mouth at bedtime.    Yes [provider]  polyethylene glycol  (MIRALAX / GLYCOLAX) packet Take 17 g by mouth daily for 30 days. 01/21/18 02/20/18  Arrien, Jimmy Picket, MD  sorbitol 70 % SOLN Take 30 mLs by mouth daily as needed for severe constipation. 01/21/18   Arrien, Jimmy Picket, MD   Allergies  Allergen Reactions  . Aspirin Other (See Comments)    Causes lethargy   Review of Systems +pain in abdomen around the waists No nausea + constipation  Physical Exam Awake alert Flat affect Clear S1 S 2 Abdomen is soft, mildly distended No edema Oriented    Vital Signs: BP (!) 91/51   Pulse 95   Temp 98 F (36.7 C) (Oral)   Resp (!) 21   SpO2 96%  Pain Scale: 0-10   Pain Score: 0-No pain   SpO2: SpO2: 96 % O2 Device:SpO2: 96 % O2 Flow Rate: .O2 Flow Rate (L/min): 2.5 L/min  IO: Intake/output summary:   Intake/Output Summary (Last 24 hours) at 01/30/2018 1228 Last data filed at 01/30/2018 1100 Gross per 24 hour  Intake 3885.77 ml  Output -  Net 3885.77 ml    LBM:   Baseline Weight:   Most recent weight:       Palliative Assessment/Data:   Flowsheet Rows     Most Recent Value  Intake Tab  Referral Department  Hospitalist  Unit at Time of Referral  ICU  Palliative Care Primary Diagnosis  Cancer  Date  Notified  01/29/18  Palliative Care Type  New Palliative care  Reason for referral  Clarify Goals of Care  Date of Admission  01/29/18  # of days IP prior to Palliative referral  0  Clinical Assessment  Psychosocial & Spiritual Assessment  Palliative Care Outcomes    PPS 40%  Time In:  10 Time Out:  11 Time Total:  60 min  Greater than 50%  of this time was spent counseling and coordinating care related to the above assessment and plan.  Signed by: Loistine Chance, MD  [phone] Please contact Palliative Medicine Team phone at [phone] for questions and concerns.  For individual provider: See Shea Evans

## 2018-01-30 NOTE — Progress Notes (Signed)
Pharmacy Antibiotic Note  Elizabeth Calhoun is a 73 y.o. female admitted on 01/29/2018 with sepsis.  Pharmacy has been consulted for cefepime and vancomycin dosing.  Plan: Cefepime 2 gm x1 then 1 Gm IV q12h Vancomycin 1500 mg x1 then 1250 mg IV q36h for est AUC = 478 Goal AUC = 400-550 F/u scr/cultures/levels     Temp (24hrs), Avg:100.3 F (37.9 C), Min:98.6 F (37 C), Max:101.9 F (38.8 C)  Recent Labs  Lab 01/29/18 2150  WBC 8.0  CREATININE 0.91    Estimated Creatinine Clearance: 46.3 mL/min (by C-G formula based on SCr of 0.91 mg/dL).    Allergies  Allergen Reactions  . Aspirin Other (See Comments)    Causes lethargy    Antimicrobials this admission: 1/23 cefepime >>  1/23 vancomycin >>   Dose adjustments this admission:   Microbiology results:  BCx:   UCx:    Sputum:    MRSA PCR:  Thank you for allowing pharmacy to be a part of this patient's care.  Dorrene German 01/30/2018 3:32 AM

## 2018-01-30 NOTE — Progress Notes (Signed)
Elizabeth Calhoun is a 73 y.o. female with medical history significant of hypertension, hyperlipidemia, diabetes mellitus, stroke, recently diagnosed PE on Xarelto, metastatic lung cancer (s/p of palliative radiation therapy), who presents with generalized weakness, poor appetite, fever and chills, mild confusion.  01/30/2018: Patient seen and examined with her 2 daughters at bedside.  States she is feeling a little better.  She has persistent nonproductive cough.  Independently reviewed her CT chest which revealed known lung mass left apical. and bilateral nodular densities.  Lactic acid, procalcitonin, WBC unremarkable.  No recurrence of fever.  Influenza a and B negative.  Respiratory viral panel in process.  Possible radiation pneumonitis? discussed with radiation oncology Dr. [name] who will see the patient in consultation.  Please refer to H&P dictated by Dr. Blaine Hamper on 01/30/2018 for further details of the assessment and plan.

## 2018-01-30 NOTE — Progress Notes (Signed)
A consult was received from an ED physician for cefepime and vancomycin per pharmacy dosing.  The patient's profile has been reviewed for ht/wt/allergies/indication/available labs.   A one time order has been placed for Cefepime 2 Gm and Vancomycin 1500 mg.  Further antibiotics/pharmacy consults should be ordered by admitting physician if indicated.                       Thank you, Dorrene German 01/30/2018  1:14 AM

## 2018-01-31 ENCOUNTER — Inpatient Hospital Stay: Payer: Medicare Other

## 2018-01-31 ENCOUNTER — Inpatient Hospital Stay: Payer: Medicare Other | Admitting: Hematology

## 2018-01-31 LAB — CBC
HCT: 26.7 % — ABNORMAL LOW (ref 36.0–46.0)
Hemoglobin: 8.4 g/dL — ABNORMAL LOW (ref 12.0–15.0)
MCH: 29.6 pg (ref 26.0–34.0)
MCHC: 31.5 g/dL (ref 30.0–36.0)
MCV: 94 fL (ref 80.0–100.0)
Platelets: 251 10*3/uL (ref 150–400)
RBC: 2.84 MIL/uL — AB (ref 3.87–5.11)
RDW: 15.1 % (ref 11.5–15.5)
WBC: 6.6 10*3/uL (ref 4.0–10.5)
nRBC: 0 % (ref 0.0–0.2)

## 2018-01-31 LAB — CBC WITH DIFFERENTIAL/PLATELET
Abs Immature Granulocytes: 0.07 10*3/uL (ref 0.00–0.07)
Basophils Absolute: 0 10*3/uL (ref 0.0–0.1)
Basophils Relative: 0 %
Eosinophils Absolute: 0.1 10*3/uL (ref 0.0–0.5)
Eosinophils Relative: 1 %
HCT: 23.2 % — ABNORMAL LOW (ref 36.0–46.0)
HEMOGLOBIN: 7 g/dL — AB (ref 12.0–15.0)
Immature Granulocytes: 1 %
Lymphocytes Relative: 20 %
Lymphs Abs: 1.8 10*3/uL (ref 0.7–4.0)
MCH: 29.2 pg (ref 26.0–34.0)
MCHC: 30.2 g/dL (ref 30.0–36.0)
MCV: 96.7 fL (ref 80.0–100.0)
MONOS PCT: 8 %
Monocytes Absolute: 0.7 10*3/uL (ref 0.1–1.0)
Neutro Abs: 6 10*3/uL (ref 1.7–7.7)
Neutrophils Relative %: 70 %
Platelets: 285 10*3/uL (ref 150–400)
RBC: 2.4 MIL/uL — ABNORMAL LOW (ref 3.87–5.11)
RDW: 15.2 % (ref 11.5–15.5)
WBC: 8.6 10*3/uL (ref 4.0–10.5)
nRBC: 0 % (ref 0.0–0.2)

## 2018-01-31 LAB — BASIC METABOLIC PANEL
Anion gap: 10 (ref 5–15)
BUN: 12 mg/dL (ref 8–23)
CALCIUM: 7.5 mg/dL — AB (ref 8.9–10.3)
CO2: 18 mmol/L — ABNORMAL LOW (ref 22–32)
Chloride: 106 mmol/L (ref 98–111)
Creatinine, Ser: 0.86 mg/dL (ref 0.44–1.00)
GFR calc Af Amer: >60 mL/min (ref >60)
GFR calc non Af Amer: >60 mL/min (ref >60)
Glucose, Bld: 156 mg/dL — ABNORMAL HIGH (ref 70–99)
Potassium: 3.8 mmol/L (ref 3.5–5.1)
Sodium: 134 mmol/L — ABNORMAL LOW (ref 135–145)

## 2018-01-31 LAB — GLUCOSE, CAPILLARY
GLUCOSE-CAPILLARY: 102 mg/dL — AB (ref 70–99)
Glucose-Capillary: 143 mg/dL — ABNORMAL HIGH (ref 70–99)
Glucose-Capillary: 173 mg/dL — ABNORMAL HIGH (ref 70–99)
Glucose-Capillary: 108 mg/dL — ABNORMAL HIGH (ref 70–99)

## 2018-01-31 LAB — URINE CULTURE

## 2018-01-31 MED ORDER — HYDROCOD POLST-CPM POLST ER 10-8 MG/5ML PO SUER
5.0000 mL | Freq: Two times a day (BID) | ORAL | Status: DC | PRN
  Administered 2018-01-31 – 2018-02-04 (×6): 5 mL via ORAL
  Filled 2018-01-31 (×6): qty 5

## 2018-01-31 MED ORDER — BENZONATATE 100 MG PO CAPS
100.0000 mg | ORAL_CAPSULE | Freq: Three times a day (TID) | ORAL | Status: DC | PRN
  Administered 2018-02-04: 100 mg via ORAL
  Filled 2018-01-31: qty 1

## 2018-01-31 MED ORDER — GUAIFENESIN 100 MG/5ML PO SOLN
5.0000 mL | ORAL | Status: DC | PRN
  Administered 2018-02-04: 100 mg via ORAL
  Filled 2018-01-31: qty 10

## 2018-01-31 MED ORDER — FENTANYL 12 MCG/HR TD PT72
1.0000 | MEDICATED_PATCH | TRANSDERMAL | Status: DC
  Administered 2018-02-01 – 2018-02-07 (×3): 1 via TRANSDERMAL
  Filled 2018-01-31 (×3): qty 1

## 2018-01-31 NOTE — Progress Notes (Signed)
   01/31/18 1612  MEWS Score  Resp (!) 34  Pulse Rate 96  BP 124/60  Temp (!) 101.3 F (38.5 C)  SpO2 92 %  O2 Device Nasal Cannula  O2 Flow Rate (L/min) 3 L/min  MEWS Score  MEWS RR 2  MEWS Pulse 0  MEWS Systolic 0  MEWS LOC 0  MEWS Temp 1  MEWS Score 3  MEWS Score Color Yellow  MEWS Assessment  Is this an acute change? Yes  MEWS guidelines implemented *See Row Information* Yellow  Rapid Response Notification  Name of Rapid Response RN Notified Christain RN  Date Rapid Response Notified 01/31/18  Time Rapid Response Notified 5701  Provider Notification  Provider Name/Title Dr Nevada Crane  Date Provider Notified 01/30/18  Time Provider Notified 1615  Notification Type Page  Notification Reason Change in status  Response See new orders  Date of Provider Response 01/30/18  Time of Provider Response [phone]

## 2018-01-31 NOTE — Progress Notes (Signed)
Pt Temp 101.3, BP 124/60, HR 96, Resp 32. No complaints of pain. Charge nurse and Dr. Nevada Crane notified. Called Rapid Response and discussed patient's change in condition. Will continue to monitor.

## 2018-01-31 NOTE — Progress Notes (Addendum)
PROGRESS NOTE  Elizabeth Calhoun UUV:[id number] DOB: April 11, 1945 DOA: 01/29/2018 PCP: Seward Carol, MD  HPI/Recap of past 24 hours: Elizabeth Calhoun is a 73 y.o. female with medical history significant of hypertension, hyperlipidemia, diabetes mellitus, stroke, recently diagnosed PE on Xarelto, metastatic lung cancer (s/p of palliative radiation therapy), who presents with generalized weakness, poor appetite, fever and chills, mild confusion.  01/30/2018: Patient seen and examined with her 2 daughters at bedside.  States she is feeling a little better.  She has persistent nonproductive cough.  Independently reviewed her CT chest which revealed known lung mass left apical. and bilateral nodular densities.  Lactic acid, procalcitonin, WBC unremarkable.  No recurrence of fever.  Influenza a and B negative.  Respiratory viral panel in process.  Possible radiation pneumonitis? discussed with radiation oncology Dr. [name] who will see the patient in consultation.  01/31/2018: Patient seen and examined with 1 of 2 daughters at bedside.  States she is breathing better however cough persists.  Assessment/Plan: Principal Problem:   Sepsis ([place]) Active Problems:   Bone metastasis ([place])   Pulmonary embolism ([facility])   Lung cancer ([facility])   HTN (hypertension)   HLD (hyperlipidemia)   Hyponatremia   Stroke ([place])   Normocytic anemia   Diabetes mellitus without complication ([place])   Acute metabolic encephalopathy   Palliative care by specialist   Goals of care, counseling/discussion  Sepsis ([place]) of unclear etiology:  Presented with fever, tachycardia, tachypnea, hypotension.   Blood pressure responded to IV fluid and is stabilized now.   Procalcitonin and lactic acid unremarkable Urine analysis unremarkable Independently reviewed chest x-ray and CT chest which revealed bilateral infiltrates and diffuse groundglass airspace density and nodularity right greater than left Continue cefepime empirically DC  IV vancomycin due to MRSA screening PCR negative Monitor fever curve and WBC Repeat CBC in the morning  Recent hx of Plmonary embolism ([place]): -on Xarelto  Lung cancer with bone metastasis: per previous discharge summary, "She does have a large left upper lobe nodule, most consistent with bronchogenic carcinoma,left suprahilar nodal mass which was obstructing the left upper lobe bronchus.Positive distant skeletal metastasis involving the proximal femur, sacrum and multiple lesions in thoracic lumbar spine, ribs and scapula. She has received palliative radiation therapy,and a cervical lymph node biopsy positive for adenocarcinoma". Pt is followed by pt Dr. [name]. -Discussed with Dr. [name] on 01/30/18  Essential hypertension: -IV Hydralazine prn -hold HTN due to hypotension  HLD (hyperlipidemia): -crestor   Resolved Hyponatremia: Continue to hold HCTZ  Hx of Stroke [facility]): -on Crestor and xarelto  Normocytic anemia: Hgb 8.4 from 9.3 suspect dilutional. Repeat CBC am  Diabetes mellitus with hyperglycemia ([place]):  Last A1c 9.0. C/w lantus and ISS  Acute metabolic encephalopathy: Resolved  DVT ppx: on Xarelto Code Status: Full code Family Communication:  Yes, patient's daughter at bedside. All questions answered to their satisfaction. Disposition Plan:  Anticipate discharge back to previous home environment Consults called:   Discussed with radiation oncology Dr. [name] and medical oncology Dr. [name] on 01/30/2018.     Objective: Vitals:   01/31/18 0245 01/31/18 0506 01/31/18 1346 01/31/18 1415  BP:  122/60 119/65   Pulse:  93 92 93  Resp:  18 (!) 28 (!) 28  Temp:  98.1 F (36.7 C) 100.2 F (37.9 C)   TempSrc:  Oral Oral   SpO2: 93% 94%  95%  Weight:      Height:        Intake/Output Summary (Last 24 hours) at 01/31/2018  [place] filed at 01/31/2018 0600 Gross per 24 hour  Intake 595.06 ml  Output -  Net 595.06 ml   Filed Weights   01/30/18 1643    Weight: 65.4 kg    Exam:  . General: 73 y.o. year-old female well developed well nourished in no acute distress.  Alert and interactive. . Cardiovascular: Regular rate and rhythm with no rubs or gallops.  No thyromegaly or JVD noted.   Marland Kitchen Respiratory: diffused rales. No wheezes. Poor inspiratory efforts. . Abdomen: Soft nontender nondistended with normal bowel sounds x4 quadrants. . Musculoskeletal: Trace lower extremity edema. 2/4 pulses in all 4 extremities. Marland Kitchen Psychiatry: Mood is appropriate for condition and setting   Data Reviewed: CBC: Recent Labs  Lab 01/29/18 2150 01/30/18 0545 01/31/18 0345  WBC 8.0 5.0 6.6  NEUTROABS 5.7  --   --   HGB 9.2* 8.7* 8.4*  HCT 28.6* 27.0* 26.7*  MCV 91.7 93.1 94.0  PLT 288 238 810   Basic Metabolic Panel: Recent Labs  Lab 01/29/18 2150 01/30/18 0545 01/31/18 0345  NA 128* 131* 134*  K 3.7 3.4* 3.8  CL 96* 103 106  CO2 23 20* 18*  GLUCOSE 197* 216* 156*  BUN 17 14 12   CREATININE 0.91 0.83 0.86  CALCIUM 8.0* 7.2* 7.5*   GFR: Estimated Creatinine Clearance: 49.2 mL/min (by C-G formula based on SCr of 0.86 mg/dL). Liver Function Tests: Recent Labs  Lab 01/29/18 2150  AST 28  ALT 19  ALKPHOS 119  BILITOT 0.8  PROT 6.4*  ALBUMIN 2.9*   No results for input(s): LIPASE, AMYLASE in the last 168 hours. No results for input(s): AMMONIA in the last 168 hours. Coagulation Profile: Recent Labs  Lab 01/29/18 2150 01/30/18 0545  INR 4.93* 3.81   Cardiac Enzymes: No results for input(s): CKTOTAL, CKMB, CKMBINDEX, TROPONINI in the last 168 hours. BNP (last 3 results) No results for input(s): PROBNP in the last 8760 hours. HbA1C: Recent Labs    01/30/18 0545  HGBA1C 10.4*   CBG: Recent Labs  Lab 01/30/18 1207 01/30/18 1704 01/30/18 2129 01/31/18 0742 01/31/18 1229  GLUCAP 304* 292* 202* 143* 173*   Lipid Profile: No results for input(s): CHOL, HDL, LDLCALC, TRIG, CHOLHDL, LDLDIRECT in the last 72 hours. Thyroid  Function Tests: No results for input(s): TSH, T4TOTAL, FREET4, T3FREE, THYROIDAB in the last 72 hours. Anemia Panel: No results for input(s): VITAMINB12, FOLATE, FERRITIN, TIBC, IRON, RETICCTPCT in the last 72 hours. Urine analysis:    Component Value Date/Time   COLORURINE YELLOW 01/30/2018 0441   APPEARANCEUR HAZY (A) 01/30/2018 0441   LABSPEC 1.020 01/30/2018 0441   PHURINE 5.0 01/30/2018 0441   GLUCOSEU NEGATIVE 01/30/2018 0441   HGBUR SMALL (A) 01/30/2018 0441   BILIRUBINUR NEGATIVE 01/30/2018 0441   KETONESUR NEGATIVE 01/30/2018 0441   PROTEINUR NEGATIVE 01/30/2018 0441   UROBILINOGEN 0.2 01/10/2010 1749   NITRITE NEGATIVE 01/30/2018 0441   LEUKOCYTESUR NEGATIVE 01/30/2018 0441   Sepsis Labs: @LABRCNTIP (procalcitonin:4,lacticidven:4)  ) Recent Results (from the past 240 hour(s))  Culture, blood (Routine x 2)     Status: None (Preliminary result)   Collection Time: 01/29/18  9:50 PM  Result Value Ref Range Status   Specimen Description   Final    BLOOD RIGHT FOREARM Performed at [facility], [place] [address], [place], [place] [address]    Special Requests   Final    BOTTLES DRAWN AEROBIC AND ANAEROBIC Blood Culture adequate volume Performed at [facility],  [place] [address] [place], [place] [address]    Culture   Final    NO GROWTH 1 DAY Performed at [facility], [place] [address], [place], [place] [address]    Report Status PENDING  Incomplete  Culture, blood (Routine x 2)     Status: None (Preliminary result)   Collection Time: 01/29/18  9:55 PM  Result Value Ref Range Status   Specimen Description   Final    BLOOD LEFT ANTECUBITAL Performed at [place] [address], [place], [place] [address]    Special Requests   Final    BOTTLES DRAWN AEROBIC AND ANAEROBIC Blood Culture results may not be optimal due to an inadequate volume of blood received in culture bottles Performed at [place] [address], [place], [place] [address]    Culture   Final    NO GROWTH 1 DAY Performed at [facility], [place] [address] [place], [place] [address]    Report Status PENDING  Incomplete  Urine Culture     Status: Abnormal   Collection Time: 01/30/18  4:41 AM  Result Value Ref Range Status   Specimen Description   Final    URINE, CLEAN CATCH Performed at [facility], [place] [address], [place], [place] [address]    Special Requests   Final    NONE Performed at [facility], [place] [address], [place], [place] [address]    Culture MULTIPLE SPECIES PRESENT, SUGGEST RECOLLECTION (A)  Final   Report Status 01/31/2018 FINAL  Final  MRSA PCR Screening     Status: None   Collection Time: 01/30/18  4:55 AM  Result Value Ref Range Status   MRSA by PCR NEGATIVE NEGATIVE Final    Comment:        The GeneXpert MRSA Assay (FDA approved for NASAL specimens only), is one component of a comprehensive MRSA colonization surveillance program. It is not intended to diagnose MRSA infection nor to guide or monitor treatment for MRSA infections. Performed at [facility], [place] [address], [place], [place] [address]   Respiratory Panel by PCR     Status: None   Collection Time: 01/30/18 11:19 AM  Result Value Ref Range Status   Adenovirus NOT DETECTED NOT DETECTED Final   Coronavirus 229E NOT DETECTED NOT DETECTED Final   Coronavirus HKU1 NOT DETECTED NOT DETECTED Final   Coronavirus NL63 NOT DETECTED NOT DETECTED Final   Coronavirus OC43 NOT DETECTED NOT DETECTED Final   Metapneumovirus NOT DETECTED NOT DETECTED Final   Rhinovirus / Enterovirus NOT DETECTED NOT DETECTED Final   Influenza A NOT DETECTED NOT DETECTED Final   Influenza B NOT DETECTED NOT DETECTED Final   Parainfluenza Virus 1 NOT DETECTED NOT DETECTED Final   Parainfluenza Virus 2 NOT DETECTED NOT DETECTED Final   Parainfluenza Virus 3 NOT DETECTED NOT DETECTED Final     Parainfluenza Virus 4 NOT DETECTED NOT DETECTED Final   Respiratory Syncytial Virus NOT DETECTED NOT DETECTED Final   Bordetella pertussis NOT DETECTED NOT DETECTED Final   Chlamydophila pneumoniae NOT DETECTED NOT DETECTED Final   Mycoplasma pneumoniae NOT DETECTED NOT DETECTED Final    Comment: Performed at [facility], [place] [address] [place],  [address]      Studies: No results found.  Scheduled Meds: . [START ON 02/01/2018] fentaNYL  1 patch Transdermal Q72H  . insulin aspart  0-9 Units Subcutaneous TID WC  .  insulin glargine  5 Units Subcutaneous QHS  . mouth rinse  15 mL Mouth Rinse BID  . Rivaroxaban  15 mg Oral BID WC  . [START ON 02/11/2018] rivaroxaban  20 mg Oral Q supper  . rosuvastatin  40 mg Oral q1800    Continuous Infusions: . sodium chloride    . ceFEPime (MAXIPIME) IV 1 g (01/31/18 1016)     LOS: 1 day     Kayleen Memos, MD [facility] Pager [phone]  If 7PM-7AM, please contact night-coverage [email or web address] Password [facility] 01/31/2018, 3:53 PM

## 2018-01-31 NOTE — Progress Notes (Signed)
PT Cancellation Note  Patient Details Name: Elizabeth Calhoun MRN: [id number] DOB: 1945/10/30   Cancelled Treatment:    Reason Eval/Treat Not Completed: Attempted PT eval. Spoke with pt's daughter who reported pt currently has a temp of 102. Daughter politely requested PT check back another day.    Weston Anna, PT Acute Rehabilitation Services Pager: [phone] Office: [phone]

## 2018-02-01 ENCOUNTER — Inpatient Hospital Stay (HOSPITAL_COMMUNITY): Payer: Medicare Other

## 2018-02-01 LAB — BASIC METABOLIC PANEL
Anion gap: 8 (ref 5–15)
BUN: 10 mg/dL (ref 8–23)
CO2: 21 mmol/L — ABNORMAL LOW (ref 22–32)
CREATININE: 0.77 mg/dL (ref 0.44–1.00)
Calcium: 7.5 mg/dL — ABNORMAL LOW (ref 8.9–10.3)
Chloride: 104 mmol/L (ref 98–111)
GFR calc Af Amer: >60 mL/min (ref >60)
GFR calc non Af Amer: >60 mL/min (ref >60)
GLUCOSE: 113 mg/dL — AB (ref 70–99)
Potassium: 3.3 mmol/L — ABNORMAL LOW (ref 3.5–5.1)
Sodium: 133 mmol/L — ABNORMAL LOW (ref 135–145)

## 2018-02-01 LAB — CBC
HCT: 26 % — ABNORMAL LOW (ref 36.0–46.0)
Hemoglobin: 8 g/dL — ABNORMAL LOW (ref 12.0–15.0)
MCH: 29 pg (ref 26.0–34.0)
MCHC: 30.8 g/dL (ref 30.0–36.0)
MCV: 94.2 fL (ref 80.0–100.0)
Platelets: 255 10*3/uL (ref 150–400)
RBC: 2.76 MIL/uL — ABNORMAL LOW (ref 3.87–5.11)
RDW: 15.4 % (ref 11.5–15.5)
WBC: 6.8 10*3/uL (ref 4.0–10.5)
nRBC: 0 % (ref 0.0–0.2)

## 2018-02-01 LAB — GLUCOSE, CAPILLARY
Glucose-Capillary: 106 mg/dL — ABNORMAL HIGH (ref 70–99)
Glucose-Capillary: 142 mg/dL — ABNORMAL HIGH (ref 70–99)
Glucose-Capillary: 180 mg/dL — ABNORMAL HIGH (ref 70–99)
Glucose-Capillary: 136 mg/dL — ABNORMAL HIGH (ref 70–99)

## 2018-02-01 MED ORDER — GUAIFENESIN ER 600 MG PO TB12
1200.0000 mg | ORAL_TABLET | Freq: Two times a day (BID) | ORAL | Status: DC
  Administered 2018-02-01 – 2018-02-07 (×13): 1200 mg via ORAL
  Filled 2018-02-01 (×13): qty 2

## 2018-02-01 MED ORDER — SODIUM CHLORIDE 3 % IN NEBU
4.0000 mL | INHALATION_SOLUTION | Freq: Three times a day (TID) | RESPIRATORY_TRACT | Status: AC
  Administered 2018-02-01 – 2018-02-03 (×8): 4 mL via RESPIRATORY_TRACT
  Filled 2018-02-01 (×9): qty 4

## 2018-02-01 MED ORDER — IPRATROPIUM-ALBUTEROL 0.5-2.5 (3) MG/3ML IN SOLN
3.0000 mL | Freq: Four times a day (QID) | RESPIRATORY_TRACT | Status: DC
  Administered 2018-02-01 – 2018-02-02 (×7): 3 mL via RESPIRATORY_TRACT
  Filled 2018-02-01 (×6): qty 3

## 2018-02-01 MED ORDER — POTASSIUM CHLORIDE CRYS ER 20 MEQ PO TBCR
40.0000 meq | EXTENDED_RELEASE_TABLET | Freq: Once | ORAL | Status: AC
  Administered 2018-02-01: 40 meq via ORAL
  Filled 2018-02-01 (×2): qty 2

## 2018-02-01 MED ORDER — GLUCERNA SHAKE PO LIQD
237.0000 mL | Freq: Three times a day (TID) | ORAL | Status: DC
  Administered 2018-02-01 – 2018-02-07 (×16): 237 mL via ORAL
  Filled 2018-02-01 (×23): qty 237

## 2018-02-01 NOTE — Plan of Care (Signed)

## 2018-02-01 NOTE — Progress Notes (Signed)
PROGRESS NOTE  Elizabeth Calhoun WNU:[id number] DOB: 1945-12-04 DOA: 01/29/2018 PCP: Seward Carol, MD  HPI/Recap of past 24 hours: Elizabeth Calhoun is a 73 y.o. female with medical history significant of hypertension, hyperlipidemia, diabetes mellitus, stroke, recently diagnosed PE on Xarelto, metastatic lung cancer (s/p of palliative radiation therapy), who presents with generalized weakness, poor appetite, fever and chills, mild confusion.  01/30/2018: Patient seen and examined with her 2 daughters at bedside.  States she is feeling a little better.  She has persistent nonproductive cough.  Independently reviewed her CT chest which revealed known lung mass left apical. and bilateral nodular densities.  Lactic acid, procalcitonin, WBC unremarkable.  No recurrence of fever.  Influenza a and B negative.  Respiratory viral panel in process.  Possible radiation pneumonitis? discussed with radiation oncology Dr. [name] who will see the patient in consultation.  02/01/2018: Seen and examined with 1 of 2 daughters at bedside.  She gets short winded with minimal conversation.  She has persistent cough and still requiring O2 supplementation with no O2 supplement at baseline.  Obtain portable chest x-ray to further assess.   Assessment/Plan: Principal Problem:   Sepsis ([place]) Active Problems:   Bone metastasis ([place])   Pulmonary embolism ([facility])   Lung cancer ([facility])   HTN (hypertension)   HLD (hyperlipidemia)   Hyponatremia   Stroke ([place])   Normocytic anemia   Diabetes mellitus without complication ([place])   Acute metabolic encephalopathy   Palliative care by specialist   Goals of care, counseling/discussion  Sepsis ([place]) of unclear etiology:  Presented with fever, tachycardia, tachypnea, hypotension.   Blood pressure responded to IV fluid and is stabilized now.   Procalcitonin and lactic acid unremarkable Urine analysis unremarkable Independently reviewed chest x-ray and CT chest which revealed  bilateral infiltrates and diffuse groundglass airspace density and nodularity right greater than left Continue cefepime empirically DC IV vancomycin due to MRSA screening PCR negative Monitor fever curve and WBC Repeat CBC in the morning  Recurrent fevers Possibly from malignancy Overnight fever with T-max of 101.5 Blood cultures negative to date No leukocytosis On IV cefepime empirically  Recent hx of Plmonary embolism ([place]): -on Xarelto  Left lung cancer with bone metastasis: per previous discharge summary, "She does have a large left upper lobe nodule, most consistent with bronchogenic carcinoma,left suprahilar nodal mass which was obstructing the left upper lobe bronchus.Positive distant skeletal metastasis involving the proximal femur, sacrum and multiple lesions in thoracic lumbar spine, ribs and scapula. She has received palliative radiation therapy,and a cervical lymph node biopsy positive for adenocarcinoma". Pt is followed by pt Dr. [name]. -Discussed with Dr. [name] on 01/30/18 Repeat chest x-ray done on 02/01/2018 revealed new rounded opacity within the left perihilar line measuring 4 cm in greatest dimension compatible with hilar mass/metastatic lymph node described on recent chest CT report.  Essential hypertension: -IV Hydralazine prn -hold HTN due to hypotension  HLD (hyperlipidemia): -crestor   Resolved Hyponatremia: Continue to hold HCTZ  Hx of Stroke [facility]): -on Crestor and xarelto  Normocytic anemia: Hgb 8.0 from 8.4 from 9.3 suspect dilutional. Repeat CBC am  Diabetes mellitus with hyperglycemia ([place]):  Last A1c 9.0. C/w lantus and ISS  Acute metabolic encephalopathy: Resolved  Physical debility: PT to assess.  Increase protein calorie intake  Moderate protein calorie malnutrition Albumin 2.9 on 01/29/2018 BMI 28 Started oral supplement Glucerna 3 times daily  DVT ppx: on Xarelto Code Status: Full code Family Communication:  Yes, patient's  daughter at bedside. All questions answered  to their satisfaction. Disposition Plan:  Anticipate discharge back to previous home environment Consults called:   Discussed with radiation oncology Dr. [name] and medical oncology Dr. [name] on 01/30/2018.     Objective: Vitals:   02/01/18 1017 02/01/18 1021 02/01/18 1120 02/01/18 1128  BP:   106/64   Pulse:   98   Resp:   20   Temp:   99.5 F (37.5 C) (!) 101.1 F (38.4 C)  TempSrc:   Oral Rectal  SpO2: 97% 97% 93%   Weight:      Height:        Intake/Output Summary (Last 24 hours) at 02/01/2018 1350 Last data filed at 02/01/2018 0800 Gross per 24 hour  Intake 0 ml  Output 1500 ml  Net -1500 ml   Filed Weights   01/30/18 1643  Weight: 65.4 kg    Exam:  . General: 73 y.o. year-old female well-developed well-nourished in no acute distress.  Alert and interactive. . Cardiovascular: Regular rate and rhythm with no rubs or gallops.  No JVD or thyromegaly noted. Marland Kitchen Respiratory: Diffuse rales bilaterally.  Poor inspiratory effort. . Abdomen: Soft nontender nondistended with normal bowel sounds x4 quadrants. . Musculoskeletal: Trace lower extremity edema. 2/4 pulses in all 4 extremities. Marland Kitchen Psychiatry: Mood is appropriate for condition and setting   Data Reviewed: CBC: Recent Labs  Lab 01/29/18 2150 01/30/18 0545 01/31/18 0345 01/31/18 1718 02/01/18 0421  WBC 8.0 5.0 6.6 8.6 6.8  NEUTROABS 5.7  --   --  6.0  --   HGB 9.2* 8.7* 8.4* 7.0* 8.0*  HCT 28.6* 27.0* 26.7* 23.2* 26.0*  MCV 91.7 93.1 94.0 96.7 94.2  PLT 288 238 251 285 163   Basic Metabolic Panel: Recent Labs  Lab 01/29/18 2150 01/30/18 0545 01/31/18 0345 02/01/18 0421  NA 128* 131* 134* 133*  K 3.7 3.4* 3.8 3.3*  CL 96* 103 106 104  CO2 23 20* 18* 21*  GLUCOSE 197* 216* 156* 113*  BUN 17 14 12 10   CREATININE 0.91 0.83 0.86 0.77  CALCIUM 8.0* 7.2* 7.5* 7.5*   GFR: Estimated Creatinine Clearance: 52.9 mL/min (by C-G formula based on SCr of 0.77  mg/dL). Liver Function Tests: Recent Labs  Lab 01/29/18 2150  AST 28  ALT 19  ALKPHOS 119  BILITOT 0.8  PROT 6.4*  ALBUMIN 2.9*   No results for input(s): LIPASE, AMYLASE in the last 168 hours. No results for input(s): AMMONIA in the last 168 hours. Coagulation Profile: Recent Labs  Lab 01/29/18 2150 01/30/18 0545  INR 4.93* 3.81   Cardiac Enzymes: No results for input(s): CKTOTAL, CKMB, CKMBINDEX, TROPONINI in the last 168 hours. BNP (last 3 results) No results for input(s): PROBNP in the last 8760 hours. HbA1C: Recent Labs    01/30/18 0545  HGBA1C 10.4*   CBG: Recent Labs  Lab 01/31/18 1229 01/31/18 1623 01/31/18 2117 02/01/18 0744 02/01/18 1209  GLUCAP 173* 108* 102* 106* 142*   Lipid Profile: No results for input(s): CHOL, HDL, LDLCALC, TRIG, CHOLHDL, LDLDIRECT in the last 72 hours. Thyroid Function Tests: No results for input(s): TSH, T4TOTAL, FREET4, T3FREE, THYROIDAB in the last 72 hours. Anemia Panel: No results for input(s): VITAMINB12, FOLATE, FERRITIN, TIBC, IRON, RETICCTPCT in the last 72 hours. Urine analysis:    Component Value Date/Time   COLORURINE YELLOW 01/30/2018 0441   APPEARANCEUR HAZY (A) 01/30/2018 0441   LABSPEC 1.020 01/30/2018 0441   PHURINE 5.0 01/30/2018 0441   GLUCOSEU NEGATIVE 01/30/2018 0441   HGBUR  SMALL (A) 01/30/2018 0441   BILIRUBINUR NEGATIVE 01/30/2018 0441   KETONESUR NEGATIVE 01/30/2018 0441   PROTEINUR NEGATIVE 01/30/2018 0441   UROBILINOGEN 0.2 01/10/2010 1749   NITRITE NEGATIVE 01/30/2018 0441   LEUKOCYTESUR NEGATIVE 01/30/2018 0441   Sepsis Labs: @LABRCNTIP (procalcitonin:4,lacticidven:4)  ) Recent Results (from the past 240 hour(s))  Culture, blood (Routine x 2)     Status: None (Preliminary result)   Collection Time: 01/29/18  9:50 PM  Result Value Ref Range Status   Specimen Description   Final    BLOOD RIGHT FOREARM Performed at [facility], [place] [address], [place], [place] [address]     Special Requests   Final    BOTTLES DRAWN AEROBIC AND ANAEROBIC Blood Culture adequate volume Performed at [facility] [address], [place], [place] [address]    Culture   Final    NO GROWTH 2 DAYS Performed at [facility] [address] [place], [place] [address]    Report Status PENDING  Incomplete  Culture, blood (Routine x 2)     Status: None (Preliminary result)   Collection Time: 01/29/18  9:55 PM  Result Value Ref Range Status   Specimen Description   Final    BLOOD LEFT ANTECUBITAL Performed at [facility] [address], [place], [place] [address]    Special Requests   Final    BOTTLES DRAWN AEROBIC AND ANAEROBIC Blood Culture results may not be optimal due to an inadequate volume of blood received in culture bottles Performed at [facility] [address], [place], [place] [address]    Culture   Final    NO GROWTH 2 DAYS Performed at [facility] [address], [place], [place] [address]    Report Status PENDING  Incomplete  Urine Culture     Status: Abnormal   Collection Time: 01/30/18  4:41 AM  Result Value Ref Range Status   Specimen Description   Final    URINE, CLEAN CATCH Performed at [facility], [place] [address] [place], [place] [address]    Special Requests   Final    NONE Performed at [facility], [place] [address], [place], [place] [address]    Culture MULTIPLE SPECIES PRESENT, SUGGEST RECOLLECTION (A)  Final   Report Status 01/31/2018 FINAL  Final  MRSA PCR Screening     Status: None   Collection Time: 01/30/18  4:55 AM  Result Value Ref Range Status   MRSA by PCR NEGATIVE NEGATIVE Final    Comment:        The GeneXpert MRSA Assay (FDA approved for NASAL specimens only), is one component of a comprehensive MRSA colonization surveillance program. It is not intended to diagnose MRSA infection nor to guide or monitor treatment  for MRSA infections. Performed at [facility], [place] [address] [place],  [address]   Respiratory Panel by PCR     Status: None   Collection Time: 01/30/18 11:19 AM  Result Value Ref Range Status   Adenovirus NOT DETECTED NOT DETECTED Final   Coronavirus 229E NOT DETECTED NOT DETECTED Final   Coronavirus HKU1 NOT DETECTED NOT DETECTED Final   Coronavirus NL63 NOT DETECTED NOT DETECTED Final   Coronavirus OC43 NOT DETECTED NOT DETECTED Final   Metapneumovirus NOT DETECTED NOT DETECTED Final   Rhinovirus / Enterovirus NOT DETECTED NOT DETECTED Final   Influenza A NOT DETECTED NOT DETECTED Final   Influenza  B NOT DETECTED NOT DETECTED Final   Parainfluenza Virus 1 NOT DETECTED NOT DETECTED Final   Parainfluenza Virus 2 NOT DETECTED NOT DETECTED Final   Parainfluenza Virus 3 NOT DETECTED NOT DETECTED Final   Parainfluenza Virus 4 NOT DETECTED NOT DETECTED Final   Respiratory Syncytial Virus NOT DETECTED NOT DETECTED Final   Bordetella pertussis NOT DETECTED NOT DETECTED Final   Chlamydophila pneumoniae NOT DETECTED NOT DETECTED Final   Mycoplasma pneumoniae NOT DETECTED NOT DETECTED Final    Comment: Performed at [facility], [place] [address] [place], [place] [address]      Studies: Dg Chest Port 1 View  Result Date: 02/01/2018 CLINICAL DATA:  Stage IV primary lung cancer. Hypoxia. EXAM: PORTABLE CHEST 1 VIEW COMPARISON:  Chest x-ray dated 01/29/2018. Chest CT dated 01/30/2018. FINDINGS: New rounded opacity within the LEFT perihilar lung measures 4 cm greatest dimension. Increased interstitial markings noted within the RIGHT perihilar lung. No pleural effusion or pneumothorax seen. Heart size is stable. Osseous structures about the chest are unremarkable. IMPRESSION: 1. New rounded opacity within the LEFT perihilar lung, measuring 4 cm greatest dimension. The rapid development suggests pneumonia, possibly postobstructive pneumonia given the perihilar  mass/metastasis demonstrated on recent chest CT of 01/30/2018 with associated encasement of the LEFT main pulmonary artery. This new opacity is less likely asymmetric pulmonary edema. 2. Increased interstitial markings within the RIGHT perihilar lung, corresponding to the ground-glass opacities described on recent CT suggesting edema or atypical pneumonia. 3. LEFT hilar opacity, compatible with the hilar mass/metastatic lymph node described on recent chest CT report. Electronically Signed   By: Franki Cabot M.D.   On: 02/01/2018 10:24    Scheduled Meds: . feeding supplement (GLUCERNA SHAKE)  237 mL Oral TID BM  . fentaNYL  1 patch Transdermal Q72H  . guaiFENesin  1,200 mg Oral BID  . insulin aspart  0-9 Units Subcutaneous TID WC  . insulin glargine  5 Units Subcutaneous QHS  . ipratropium-albuterol  3 mL Nebulization Q6H  . mouth rinse  15 mL Mouth Rinse BID  . potassium chloride  40 mEq Oral Once  . Rivaroxaban  15 mg Oral BID WC  . [START ON 02/11/2018] rivaroxaban  20 mg Oral Q supper  . rosuvastatin  40 mg Oral q1800  . sodium chloride HYPERTONIC  4 mL Nebulization TID    Continuous Infusions: . sodium chloride 50 mL/hr at 01/31/18 1702  . ceFEPime (MAXIPIME) IV 1 g (02/01/18 1210)     LOS: 2 days     Kayleen Memos, MD [facility] Pager [phone]  If 7PM-7AM, please contact night-coverage [email or web address] Password [email or web address] 02/01/2018, 1:50 PM

## 2018-02-01 NOTE — Progress Notes (Signed)
[place] increased to yellow. Vitals temp 101.5, bp 138/70, pulse 92, 02 sat 92 percent with oxygen at 3L/min via nasal cannula. Assessed pt, was alert, oriented and said she felt fine. Notified Bodenheimer, NP. No new orders at this time. Administered prn tylenol. Rechecked vitals 1hr later, temp now coming down at 99.2. Will continue to monitor with frequent vital sign checks per protocol.

## 2018-02-01 NOTE — Evaluation (Signed)
Physical Therapy Evaluation Patient Details Name: Elizabeth Calhoun MRN: [id number] DOB: [birth year] Today's Date: 02/01/2018   History of Present Illness  Pt admitted with generalized weakness and sepsis.  Pt wtih hx of CVA, Stage IV lung CA with spinal mets, DM, and recent PE  Clinical Impression  Pt admitted as above and presenting with functional mobility limitations 2* generalized weakness, balance deficits, and limited endurance (pt O2 increased to 3L 2* desat with activity).  Pt should progress to dc home with assist of family and would benefit from follow up HHPT to further address deficits.    Follow Up Recommendations Home health PT    Equipment Recommendations  Rolling walker with 5" wheels(Youth level RW please - pt is 5'0")    Recommendations for Other Services OT consult     Precautions / Restrictions Precautions Precautions: Fall Restrictions Weight Bearing Restrictions: No      Mobility  Bed Mobility               General bed mobility comments: NT - pt up in chair and requests back to same  Transfers Overall transfer level: Needs assistance Equipment used: None Transfers: Sit to/from Stand Sit to Stand: Min assist         General transfer comment: steady assist and cues for use of UEs to self assist  Ambulation/Gait Ambulation/Gait assistance: Min assist Gait Distance (Feet): 200 Feet Assistive device: Rolling walker (2 wheeled) Gait Pattern/deviations: Step-through pattern;Decreased step length - right;Decreased step length - left;Shuffle;Trunk flexed Gait velocity: decr   General Gait Details: cues for posture, position from RW and safety awareness.  Multiple standing rest breaks and O2 increased to 3L 2* desat  Stairs            Wheelchair Mobility    Modified Rankin (Stroke Patients Only)       Balance Overall balance assessment: Needs assistance Sitting-balance support: No upper extremity supported;Feet supported Sitting  balance-Leahy Scale: Good       Standing balance-Leahy Scale: Poor                               Pertinent Vitals/Pain Pain Assessment: No/denies pain    Home Living Family/patient expects to be discharged to:: Private residence Living Arrangements: Children;Other relatives Available Help at Discharge: Family Type of Home: House Home Access: Level entry     Home Layout: One level Home Equipment: None      Prior Function Level of Independence: Independent         Comments: assist of family as needed      Hand Dominance        Extremity/Trunk Assessment   Upper Extremity Assessment Upper Extremity Assessment: Generalized weakness    Lower Extremity Assessment Lower Extremity Assessment: Generalized weakness       Communication   Communication: No difficulties  Cognition Arousal/Alertness: Awake/alert Behavior During Therapy: WFL for tasks assessed/performed Overall Cognitive Status: Within Functional Limits for tasks assessed                                        General Comments      Exercises     Assessment/Plan    PT Assessment Patient needs continued PT services  PT Problem List Decreased strength;Decreased activity tolerance;Decreased balance;Decreased mobility;Decreased knowledge of use of DME       PT  Treatment Interventions DME instruction;Gait training;Functional mobility training;Therapeutic activities;Therapeutic exercise;Balance training;Patient/family education    PT Goals (Current goals can be found in the Care Plan section)  Acute Rehab PT Goals Patient Stated Goal: Regain IND Time For Goal Achievement: 02/15/18 Potential to Achieve Goals: Fair    Frequency Min 3X/week   Barriers to discharge        Co-evaluation               AM-PAC PT "6 Clicks" Mobility  Outcome Measure Help needed turning from your back to your side while in a flat bed without using bedrails?: A Little Help  needed moving from lying on your back to sitting on the side of a flat bed without using bedrails?: A Little Help needed moving to and from a bed to a chair (including a wheelchair)?: A Little Help needed standing up from a chair using your arms (e.g., wheelchair or bedside chair)?: A Little Help needed to walk in hospital room?: A Little Help needed climbing 3-5 steps with a railing? : A Lot 6 Click Score: 17    End of Session Equipment Utilized During Treatment: Gait belt;Oxygen Activity Tolerance: Patient tolerated treatment well Patient left: in chair;with call bell/phone within reach;with family/visitor present Nurse Communication: Mobility status PT Visit Diagnosis: Unsteadiness on feet (R26.81);Muscle weakness (generalized) (M62.81);Difficulty in walking, not elsewhere classified (R26.2)    Time: 1610-9604 PT Time Calculation (min) (ACUTE ONLY): 25 min   Charges:   PT Evaluation $PT Eval Low Complexity: 1 Low PT Treatments $Gait Training: 8-22 mins        Mauro Kaufmann PT Acute Rehabilitation Services Pager [phone] Office [phone]   Elizabeth Calhoun 02/01/2018, 4:30 PM

## 2018-02-02 LAB — GLUCOSE, CAPILLARY
GLUCOSE-CAPILLARY: 115 mg/dL — AB (ref 70–99)
Glucose-Capillary: 168 mg/dL — ABNORMAL HIGH (ref 70–99)
Glucose-Capillary: 183 mg/dL — ABNORMAL HIGH (ref 70–99)
Glucose-Capillary: 154 mg/dL — ABNORMAL HIGH (ref 70–99)

## 2018-02-02 LAB — BASIC METABOLIC PANEL
Anion gap: 9 (ref 5–15)
BUN: 11 mg/dL (ref 8–23)
CALCIUM: 7.5 mg/dL — AB (ref 8.9–10.3)
CO2: 21 mmol/L — ABNORMAL LOW (ref 22–32)
Chloride: 105 mmol/L (ref 98–111)
Creatinine, Ser: 0.7 mg/dL (ref 0.44–1.00)
GFR calc Af Amer: >60 mL/min (ref >60)
Glucose, Bld: 117 mg/dL — ABNORMAL HIGH (ref 70–99)
Potassium: 3.8 mmol/L (ref 3.5–5.1)
Sodium: 135 mmol/L (ref 135–145)

## 2018-02-02 LAB — CBC
HCT: 26.1 % — ABNORMAL LOW (ref 36.0–46.0)
Hemoglobin: 8.2 g/dL — ABNORMAL LOW (ref 12.0–15.0)
MCH: 29.4 pg (ref 26.0–34.0)
MCHC: 31.4 g/dL (ref 30.0–36.0)
MCV: 93.5 fL (ref 80.0–100.0)
Platelets: 255 10*3/uL (ref 150–400)
RBC: 2.79 MIL/uL — ABNORMAL LOW (ref 3.87–5.11)
RDW: 15.3 % (ref 11.5–15.5)
WBC: 7.4 10*3/uL (ref 4.0–10.5)
nRBC: 0 % (ref 0.0–0.2)

## 2018-02-02 MED ORDER — LOSARTAN POTASSIUM 25 MG PO TABS
25.0000 mg | ORAL_TABLET | Freq: Every day | ORAL | Status: DC
  Administered 2018-02-02 – 2018-02-07 (×6): 25 mg via ORAL
  Filled 2018-02-02 (×6): qty 1

## 2018-02-02 MED ORDER — IPRATROPIUM-ALBUTEROL 0.5-2.5 (3) MG/3ML IN SOLN
3.0000 mL | Freq: Three times a day (TID) | RESPIRATORY_TRACT | Status: DC
  Administered 2018-02-03 – 2018-02-07 (×12): 3 mL via RESPIRATORY_TRACT
  Filled 2018-02-02 (×13): qty 3

## 2018-02-02 MED ORDER — ALBUTEROL SULFATE (2.5 MG/3ML) 0.083% IN NEBU: 2.5000 mg | INHALATION_SOLUTION | RESPIRATORY_TRACT | Status: DC | PRN

## 2018-02-02 NOTE — Progress Notes (Signed)
PROGRESS NOTE  Elizabeth Calhoun JKK:[id number] DOB: 23-Apr-1945 DOA: 01/29/2018 PCP: Seward Carol, MD  HPI/Recap of past 24 hours: Elizabeth Calhoun is a 73 y.o. female with medical history significant of hypertension, hyperlipidemia, diabetes mellitus, stroke, recently diagnosed PE on Xarelto, metastatic lung cancer (s/p of palliative radiation therapy), who presents with generalized weakness, poor appetite, fever and chills, mild confusion.  01/30/2018: Patient seen and examined with her 2 daughters at bedside.  States she is feeling a little better.  She has persistent nonproductive cough.  Independently reviewed her CT chest which revealed known lung mass left apical. and bilateral nodular densities.  Lactic acid, procalcitonin, WBC unremarkable.  No recurrence of fever.  Influenza a and B negative.  Respiratory viral panel in process.  Possible radiation pneumonitis? discussed with radiation oncology Dr. [name] who will see the patient in consultation.  02/01/2018: Seen and examined with 1 of 2 daughters at bedside.  She gets short winded with minimal conversation.  She has persistent cough and still requiring O2 supplementation with no O2 supplement at baseline.  Obtain portable chest x-ray to further assess.  02/02/18: Seen and examined with her daughter Elizabeth Calhoun at bedside.  No new complaints.  Blood pressure mildly elevated this morning we will restart losartan and closely monitor her creatinine and sodium level.   Assessment/Plan: Principal Problem:   Sepsis ([place]) Active Problems:   Bone metastasis ([place])   Pulmonary embolism ([facility])   Lung cancer ([facility])   HTN (hypertension)   HLD (hyperlipidemia)   Hyponatremia   Stroke ([place])   Normocytic anemia   Diabetes mellitus without complication ([place])   Acute metabolic encephalopathy   Palliative care by specialist   Goals of care, counseling/discussion  Sepsis ([place]) of unclear etiology:  Presented with fever, tachycardia, tachypnea,  hypotension.   Blood pressure responded to IV fluid and is stabilized now.   Procalcitonin and lactic acid unremarkable Urine analysis unremarkable Independently reviewed chest x-ray and CT chest which revealed bilateral infiltrates and diffuse groundglass airspace density and nodularity right greater than left Continue cefepime empirically DC IV vancomycin due to MRSA screening PCR negative Continues to have intermittent fevers No leukocytosis, states she feels well Does not appear septic Continue IV antibiotics empirically   Recurrent fevers Possibly from malignancy Overnight fever with T-max of 101 Blood cultures negative to date No leukocytosis On IV cefepime empirically  Hypertension Blood pressure mildly elevated Restart losartan Monitor renal function and hyponatremia  Recent hyponatremia Continue to hold HCTZ Restarted losartan today due to mildly elevated blood pressure We will monitor creatinine and sodium level Obtain BMP in the morning  Recent hx of Plmonary embolism ([place]): -on Xarelto  Left lung cancer with bone metastasis: per previous discharge summary, "She does have a large left upper lobe nodule, most consistent with bronchogenic carcinoma,left suprahilar nodal mass which was obstructing the left upper lobe bronchus.Positive distant skeletal metastasis involving the proximal femur, sacrum and multiple lesions in thoracic lumbar spine, ribs and scapula. She has received palliative radiation therapy,and a cervical lymph node biopsy positive for adenocarcinoma". Pt is followed by pt Dr. [name]. -Discussed with Dr. [name] on 01/30/18 Repeat chest x-ray done on 02/01/2018 revealed new rounded opacity within the left perihilar line measuring 4 cm in greatest dimension compatible with hilar mass/metastatic lymph node described on recent chest CT report.  HLD (hyperlipidemia): -crestor   Resolved Hyponatremia: Continue to hold HCTZ  Hx of Stroke [facility]): -on  Crestor and xarelto  Normocytic anemia: Hgb 8.0 from 8.4 from  9.3 suspect dilutional. Repeat CBC am  Diabetes mellitus with hyperglycemia ([place]):  Last A1c 9.0. C/w lantus and ISS  Acute metabolic encephalopathy: Resolved  Physical debility: PT to assess.  Increase protein calorie intake  Moderate protein calorie malnutrition Albumin 2.9 on 01/29/2018 BMI 28 Started oral supplement Glucerna 3 times daily  DVT ppx: on Xarelto Code Status: Full code Family Communication:  Yes, patient's daughter at bedside. All questions answered to their satisfaction. Disposition Plan:  Anticipate discharge back to previous home environment Consults called:   Discussed with radiation oncology Dr. [name] and medical oncology Dr. [name] on 01/30/2018.     Objective: Vitals:   02/02/18 0445 02/02/18 0551 02/02/18 0805 02/02/18 1226  BP: 120/66   (!) 146/85  Pulse: 93   (!) 106  Resp: 16   (!) 24  Temp: (!) 101 F (38.3 C) 99.6 F (37.6 C)  (!) 100.6 F (38.1 C)  TempSrc: Oral Oral  Oral  SpO2: 96%  92% 98%  Weight:      Height:        Intake/Output Summary (Last 24 hours) at 02/02/2018 1252 Last data filed at 02/02/2018 1610 Gross per 24 hour  Intake 1069.11 ml  Output 900 ml  Net 169.11 ml   Filed Weights   01/30/18 1643  Weight: 65.4 kg    Exam:  . General: 73 y.o. year-old female well-developed well-nourished in no acute distress.  Alert and oriented x3. . Cardiovascular: Regular rate and rhythm with no rubs or gallops.  No JVD or thyromegaly . Respiratory: Mild rales at bases with no wheezes.  Good inspiratory effort.   . Abdomen: Soft nontender nondistended with normal bowel sounds x4 quadrants. . Musculoskeletal: Trace lower extremity edema. 2/4 pulses in all 4 extremities. Marland Kitchen Psychiatry: Mood is appropriate for condition and setting   Data Reviewed: CBC: Recent Labs  Lab 01/29/18 2150 01/30/18 0545 01/31/18 0345 01/31/18 1718 02/01/18 0421 02/02/18 0415  WBC 8.0  5.0 6.6 8.6 6.8 7.4  NEUTROABS 5.7  --   --  6.0  --   --   HGB 9.2* 8.7* 8.4* 7.0* 8.0* 8.2*  HCT 28.6* 27.0* 26.7* 23.2* 26.0* 26.1*  MCV 91.7 93.1 94.0 96.7 94.2 93.5  PLT 288 238 251 285 255 960   Basic Metabolic Panel: Recent Labs  Lab 01/29/18 2150 01/30/18 0545 01/31/18 0345 02/01/18 0421 02/02/18 0415  NA 128* 131* 134* 133* 135  K 3.7 3.4* 3.8 3.3* 3.8  CL 96* 103 106 104 105  CO2 23 20* 18* 21* 21*  GLUCOSE 197* 216* 156* 113* 117*  BUN 17 14 12 10 11   CREATININE 0.91 0.83 0.86 0.77 0.70  CALCIUM 8.0* 7.2* 7.5* 7.5* 7.5*   GFR: Estimated Creatinine Clearance: 52.9 mL/min (by C-G formula based on SCr of 0.7 mg/dL). Liver Function Tests: Recent Labs  Lab 01/29/18 2150  AST 28  ALT 19  ALKPHOS 119  BILITOT 0.8  PROT 6.4*  ALBUMIN 2.9*   No results for input(s): LIPASE, AMYLASE in the last 168 hours. No results for input(s): AMMONIA in the last 168 hours. Coagulation Profile: Recent Labs  Lab 01/29/18 2150 01/30/18 0545  INR 4.93* 3.81   Cardiac Enzymes: No results for input(s): CKTOTAL, CKMB, CKMBINDEX, TROPONINI in the last 168 hours. BNP (last 3 results) No results for input(s): PROBNP in the last 8760 hours. HbA1C: No results for input(s): HGBA1C in the last 72 hours. CBG: Recent Labs  Lab 02/01/18 1209 02/01/18 1611 02/01/18 2109 02/02/18  0732 02/02/18 1223  GLUCAP 142* 180* 136* 115* 168*   Lipid Profile: No results for input(s): CHOL, HDL, LDLCALC, TRIG, CHOLHDL, LDLDIRECT in the last 72 hours. Thyroid Function Tests: No results for input(s): TSH, T4TOTAL, FREET4, T3FREE, THYROIDAB in the last 72 hours. Anemia Panel: No results for input(s): VITAMINB12, FOLATE, FERRITIN, TIBC, IRON, RETICCTPCT in the last 72 hours. Urine analysis:    Component Value Date/Time   COLORURINE YELLOW 01/30/2018 0441   APPEARANCEUR HAZY (A) 01/30/2018 0441   LABSPEC 1.020 01/30/2018 0441   PHURINE 5.0 01/30/2018 0441   GLUCOSEU NEGATIVE 01/30/2018 0441    HGBUR SMALL (A) 01/30/2018 0441   BILIRUBINUR NEGATIVE 01/30/2018 0441   KETONESUR NEGATIVE 01/30/2018 0441   PROTEINUR NEGATIVE 01/30/2018 0441   UROBILINOGEN 0.2 01/10/2010 1749   NITRITE NEGATIVE 01/30/2018 0441   LEUKOCYTESUR NEGATIVE 01/30/2018 0441   Sepsis Labs: @LABRCNTIP (procalcitonin:4,lacticidven:4)  ) Recent Results (from the past 240 hour(s))  Culture, blood (Routine x 2)     Status: None (Preliminary result)   Collection Time: 01/29/18  9:50 PM  Result Value Ref Range Status   Specimen Description   Final    BLOOD RIGHT FOREARM Performed at [facility], [place] [address], [place], [place] [address]    Special Requests   Final    BOTTLES DRAWN AEROBIC AND ANAEROBIC Blood Culture adequate volume Performed at [facility] [address], [place], [place] [address]    Culture   Final    NO GROWTH 3 DAYS Performed at [facility], [place] [address], [place], [place] [address]    Report Status PENDING  Incomplete  Culture, blood (Routine x 2)     Status: None (Preliminary result)   Collection Time: 01/29/18  9:55 PM  Result Value Ref Range Status   Specimen Description   Final    BLOOD LEFT ANTECUBITAL Performed at [facility] [address], [place], [place] [address]    Special Requests   Final    BOTTLES DRAWN AEROBIC AND ANAEROBIC Blood Culture results may not be optimal due to an inadequate volume of blood received in culture bottles Performed at [facility] [address] [place], [place] [address]    Culture   Final    NO GROWTH 3 DAYS Performed at [facility], [place] [address], [place], [place] [address]    Report Status PENDING  Incomplete  Urine Culture     Status: Abnormal   Collection Time: 01/30/18  4:41 AM  Result Value Ref Range Status   Specimen Description   Final    URINE, CLEAN CATCH Performed at [facility], [place] [address] [place],  [place] [address]    Special Requests   Final    NONE Performed at [facility], [place] [address], [place],  [address]    Culture MULTIPLE SPECIES PRESENT, SUGGEST RECOLLECTION (A)  Final   Report Status 01/31/2018 FINAL  Final  MRSA PCR Screening     Status: None   Collection Time: 01/30/18  4:55 AM  Result Value Ref Range Status   MRSA by PCR NEGATIVE NEGATIVE Final    Comment:        The GeneXpert MRSA Assay (FDA approved for NASAL specimens only), is one component of a comprehensive MRSA colonization surveillance program. It is not intended to diagnose MRSA infection nor to guide or monitor treatment for MRSA infections. Performed at [facility], [address] [place], [place] [address]   Respiratory Panel by PCR     Status: None   Collection Time: 01/30/18 11:19 AM  Result Value Ref Range Status   Adenovirus NOT DETECTED NOT DETECTED Final   Coronavirus 229E NOT DETECTED NOT DETECTED Final   Coronavirus HKU1 NOT DETECTED NOT DETECTED Final   Coronavirus NL63 NOT DETECTED NOT DETECTED Final   Coronavirus OC43 NOT DETECTED NOT DETECTED Final   Metapneumovirus NOT DETECTED NOT DETECTED Final   Rhinovirus / Enterovirus NOT DETECTED NOT DETECTED Final   Influenza A NOT DETECTED NOT DETECTED Final   Influenza B NOT DETECTED NOT DETECTED Final   Parainfluenza Virus 1 NOT DETECTED NOT DETECTED Final   Parainfluenza Virus 2 NOT DETECTED NOT DETECTED Final   Parainfluenza Virus 3 NOT DETECTED NOT DETECTED Final   Parainfluenza Virus 4 NOT DETECTED NOT DETECTED Final   Respiratory Syncytial Virus NOT DETECTED NOT DETECTED Final   Bordetella pertussis NOT DETECTED NOT DETECTED Final   Chlamydophila pneumoniae NOT DETECTED NOT DETECTED Final   Mycoplasma pneumoniae NOT DETECTED NOT DETECTED Final    Comment: Performed at [facility], [place] [address] [place], [place] [address]  Culture, blood (routine x 2)     Status: None (Preliminary  result)   Collection Time: 01/31/18  5:18 PM  Result Value Ref Range Status   Specimen Description   Final    BLOOD LEFT ARM Performed at [facility] [address], [place], [place] [address]    Special Requests   Final    BOTTLES DRAWN AEROBIC ONLY Blood Culture adequate volume Performed at [facility] [address] [place], [place] [address]    Culture   Final    NO GROWTH 1 DAY Performed at [facility], [place] [address] [place], [place] [address]    Report Status PENDING  Incomplete  Culture, blood (routine x 2)     Status: None (Preliminary result)   Collection Time: 01/31/18  5:18 PM  Result Value Ref Range Status   Specimen Description   Final    BLOOD RIGHT ARM Performed at [facility] [address] [place], [place] [address]    Special Requests   Final    BOTTLES DRAWN AEROBIC ONLY Blood Culture results may not be optimal due to an inadequate volume of blood received in culture bottles Performed at [facility] [place] [address], [place], [place] [address]    Culture   Final    NO GROWTH 1 DAY Performed at [facility], [place] [address], [place], [place] [address]    Report Status PENDING  Incomplete      Studies: No results found.  Scheduled Meds: . feeding supplement (GLUCERNA SHAKE)  237 mL Oral TID BM  . fentaNYL  1 patch Transdermal Q72H  . guaiFENesin  1,200 mg Oral BID  . insulin aspart  0-9 Units Subcutaneous TID WC  . insulin glargine  5 Units Subcutaneous QHS  . ipratropium-albuterol  3 mL Nebulization Q6H  . losartan  25 mg Oral Daily  . mouth rinse  15 mL Mouth Rinse BID  . Rivaroxaban  15 mg Oral BID WC  . [START ON 02/11/2018] rivaroxaban  20 mg Oral Q supper  . rosuvastatin  40 mg Oral q1800  . sodium chloride HYPERTONIC  4 mL Nebulization TID    Continuous Infusions: . sodium chloride Stopped (02/02/18 1042)  . ceFEPime (MAXIPIME)  IV 1 g (02/02/18  0905)     LOS: 3 days     Kayleen Memos, MD [facility] Pager [phone]  If 7PM-7AM, please contact night-coverage [email or web address] Password [facility] 02/02/2018, 12:52 PM

## 2018-02-02 NOTE — Plan of Care (Signed)

## 2018-02-02 NOTE — Progress Notes (Signed)
Pharmacy Antibiotic Note  Millette Halberstam is a 73 y.o. female admitted on 01/29/2018 with sepsis.  Pharmacy has been consulted for cefepime and vancomycin dosing. Vanc d/c'd 1/24 and cefepime continues  Today, 02/02/18  Tmax 101 with continued fevers  WBC WNL stable  SCr stable CrCl 53  Cultures ngtd  Plan: Continue cefepime 1g IV q12 per current renal function CrCl 30-60 ml/min   Height: 5' (152.4 cm) Weight: 144 lb 2.9 oz (65.4 kg) IBW/kg (Calculated) : 45.5  Temp (24hrs), Avg:99.9 F (37.7 C), Min:98.5 F (36.9 C), Max:101.1 F (38.4 C)  Recent Labs  Lab 01/29/18 2150 01/29/18 2350 01/30/18 0545 01/31/18 0345 01/31/18 1718 02/01/18 0421 02/02/18 0415  WBC 8.0  --  5.0 6.6 8.6 6.8 7.4  CREATININE 0.91  --  0.83 0.86  --  0.77 0.70  LATICACIDVEN  --  1.1 1.1  --   --   --   --     Estimated Creatinine Clearance: 52.9 mL/min (by C-G formula based on SCr of 0.7 mg/dL).    Allergies  Allergen Reactions  . Aspirin Other (See Comments)    Causes lethargy    Antimicrobials this admission: 1/23 cefepime >>  1/23 vancomycin >> 1/24  Dose adjustments this admission:   Microbiology results: 1/22 Influenza A/B: neg/neg 1/23 BCx: ngtd 1/23 UCx: multiple species, suggest recollect 1/23 MRSA PCR: Negative 1/23 Respiratory panel: negative   Thank you for allowing pharmacy to be a part of this patient's care.  Adrian Saran, PharmD, BCPS Pager [phone] 02/02/2018 10:22 AM

## 2018-02-02 NOTE — Plan of Care (Signed)

## 2018-02-03 ENCOUNTER — Telehealth: Payer: Self-pay | Admitting: *Deleted

## 2018-02-03 ENCOUNTER — Encounter (HOSPITAL_COMMUNITY): Payer: Self-pay | Admitting: Hematology

## 2018-02-03 ENCOUNTER — Telehealth: Payer: Self-pay | Admitting: Hematology

## 2018-02-03 DIAGNOSIS — E119 Type 2 diabetes mellitus without complications: Secondary | ICD-10-CM

## 2018-02-03 DIAGNOSIS — Z886 Allergy status to analgesic agent status: Secondary | ICD-10-CM

## 2018-02-03 DIAGNOSIS — J189 Pneumonia, unspecified organism: Secondary | ICD-10-CM

## 2018-02-03 DIAGNOSIS — Z86711 Personal history of pulmonary embolism: Secondary | ICD-10-CM

## 2018-02-03 DIAGNOSIS — K5900 Constipation, unspecified: Secondary | ICD-10-CM

## 2018-02-03 DIAGNOSIS — E785 Hyperlipidemia, unspecified: Secondary | ICD-10-CM

## 2018-02-03 DIAGNOSIS — R4182 Altered mental status, unspecified: Secondary | ICD-10-CM

## 2018-02-03 DIAGNOSIS — Z7901 Long term (current) use of anticoagulants: Secondary | ICD-10-CM

## 2018-02-03 DIAGNOSIS — I10 Essential (primary) hypertension: Secondary | ICD-10-CM

## 2018-02-03 DIAGNOSIS — C3490 Malignant neoplasm of unspecified part of unspecified bronchus or lung: Secondary | ICD-10-CM

## 2018-02-03 DIAGNOSIS — M549 Dorsalgia, unspecified: Secondary | ICD-10-CM

## 2018-02-03 DIAGNOSIS — R509 Fever, unspecified: Secondary | ICD-10-CM

## 2018-02-03 LAB — CBC
HCT: 31.2 % — ABNORMAL LOW (ref 36.0–46.0)
Hemoglobin: 10.1 g/dL — ABNORMAL LOW (ref 12.0–15.0)
MCH: 29.1 pg (ref 26.0–34.0)
MCHC: 32.4 g/dL (ref 30.0–36.0)
MCV: 89.9 fL (ref 80.0–100.0)
Platelets: 305 10*3/uL (ref 150–400)
RBC: 3.47 MIL/uL — ABNORMAL LOW (ref 3.87–5.11)
RDW: 15.5 % (ref 11.5–15.5)
WBC: 8.5 10*3/uL (ref 4.0–10.5)
nRBC: 0 % (ref 0.0–0.2)

## 2018-02-03 LAB — GLUCOSE, CAPILLARY
Glucose-Capillary: 119 mg/dL — ABNORMAL HIGH (ref 70–99)
Glucose-Capillary: 155 mg/dL — ABNORMAL HIGH (ref 70–99)
Glucose-Capillary: 108 mg/dL — ABNORMAL HIGH (ref 70–99)
Glucose-Capillary: 159 mg/dL — ABNORMAL HIGH (ref 70–99)

## 2018-02-03 LAB — BASIC METABOLIC PANEL
Anion gap: 9 (ref 5–15)
BUN: 10 mg/dL (ref 8–23)
CO2: 21 mmol/L — ABNORMAL LOW (ref 22–32)
CREATININE: 0.87 mg/dL (ref 0.44–1.00)
Calcium: 8.2 mg/dL — ABNORMAL LOW (ref 8.9–10.3)
Chloride: 107 mmol/L (ref 98–111)
GFR calc Af Amer: >60 mL/min (ref >60)
GFR calc non Af Amer: >60 mL/min (ref >60)
Glucose, Bld: 112 mg/dL — ABNORMAL HIGH (ref 70–99)
Potassium: 4.5 mmol/L (ref 3.5–5.1)
Sodium: 137 mmol/L (ref 135–145)

## 2018-02-03 MED ORDER — PIPERACILLIN-TAZOBACTAM 3.375 G IVPB
3.3750 g | Freq: Three times a day (TID) | INTRAVENOUS | Status: DC
  Administered 2018-02-03 – 2018-02-07 (×11): 3.375 g via INTRAVENOUS
  Filled 2018-02-03 (×13): qty 50

## 2018-02-03 MED ORDER — PIPERACILLIN-TAZOBACTAM 3.375 G IVPB 30 MIN
3.3750 g | Freq: Once | INTRAVENOUS | Status: AC
  Administered 2018-02-03: 3.375 g via INTRAVENOUS
  Filled 2018-02-03 (×2): qty 50

## 2018-02-03 MED ORDER — KETOROLAC TROMETHAMINE 30 MG/ML IJ SOLN
30.0000 mg | Freq: Once | INTRAMUSCULAR | Status: AC
  Administered 2018-02-03: 30 mg via INTRAVENOUS
  Filled 2018-02-03: qty 1

## 2018-02-03 NOTE — Progress Notes (Signed)
Called to pt's room d/t pt shaking and feeling feverish. Oral temp 99.8. 101.7 rectal. PRN tylenol given with minimal effect. NP on call, Bodenheimer, paged and one time dose of toradol ordered and administered. Temp 100.9 on reassessment. Per NP, continue to watch.

## 2018-02-03 NOTE — Telephone Encounter (Signed)
Family called to report patient still in hospital. Asked when Dr. [name] would be there to see patient.  Per Dr. [name], provide following information: His role is providing outpatient oncological care, which will resume once mother is out of hospital and has recovered. The attending/hopitalist is directing their mother's care.   Dr. [name] consults regarding oncological matters while patient is in hospital and is available to speak with the hospitalist regarding oncological matters. He will try to visit their mother today after clinic.  Daughters Deretha and [place] stated their mother is still running a fever. Advised them to speak with hospitalist regarding plan of care while inpatient.  They asked about the appointment their mother missed on Friday. Advised them that the appt was cancelled and that scheduling was contacted to reschedule once discharged from hospital. Also informed them that they can contact scheduling once discharge is known to set up appt with Dr. [name]. They verbalized understanding of information.

## 2018-02-03 NOTE — Plan of Care (Signed)

## 2018-02-03 NOTE — Progress Notes (Signed)
After family spoke to Dr [name] staff they requested to talk to Dr. Nevada Crane, have questions with patient's plan of care. Dr. Nevada Crane paged/informed and she called to the room and spoke to daughters.

## 2018-02-03 NOTE — Consult Note (Signed)
[facility]  Total days of antibiotics 6 cefepime              Reason for Consult: fever in immunocompromised host   Referring Physician: hall  Principal Problem:   Sepsis ([place]) Active Problems:   Bone metastasis ([place])   Pulmonary embolism ([facility])   Lung cancer ([place])   HTN (hypertension)   HLD (hyperlipidemia)   Hyponatremia   Stroke ([place])   Normocytic anemia   Diabetes mellitus without complication ([place])   Acute metabolic encephalopathy   Palliative care by specialist   Goals of care, counseling/discussion    HPI: Elizabeth Calhoun is a 73 y.o. female with DM, who has had intermittent back pain x 6 months and recently diagnosed with metastastic lung cancer (LN bx in mid dec adenoca+) has received 1 course of palliative radiation thus far, HTN, HLD, recently diagnosed on 1/12 with PE on xarelto who ws admitted for weakness, fever, chills and confusion. Fevers as high as 102F. She also reports dry cough only. Patient reports that she doesn't necessarily sense when she has fever but her daughters report rigors/shivering/AMS. She has been started on cefepime for HCAP, CT shows bilateral ground glass-nodural density as well as apical infiltrate c/w malignancy per my review  She has been on cefepime for pneumonia x 6 day and still has persistent,  fever  Past Medical History:  Diagnosis Date  . Adenomatous colon polyp   . Cancer ([place])   . Diabetes mellitus without complication ([place])   . Hemorrhoids   . Menopause   . OA (osteoarthritis)   . Obesity   . Other and unspecified hyperlipidemia   . Spine metastasis ([place])   . Stage 4 lung cancer ([place])   . Stroke [facility])     Allergies:  Allergies  Allergen Reactions  . Aspirin Other (See Comments)    Causes lethargy    MEDICATIONS: . feeding supplement (GLUCERNA SHAKE)  237 mL Oral TID BM  . fentaNYL  1 patch Transdermal Q72H  . guaiFENesin  1,200 mg Oral BID  . insulin aspart  0-9 Units Subcutaneous  TID WC  . insulin glargine  5 Units Subcutaneous QHS  . ipratropium-albuterol  3 mL Nebulization TID  . losartan  25 mg Oral Daily  . mouth rinse  15 mL Mouth Rinse BID  . Rivaroxaban  15 mg Oral BID WC  . [START ON 02/11/2018] rivaroxaban  20 mg Oral Q supper  . rosuvastatin  40 mg Oral q1800  . sodium chloride HYPERTONIC  4 mL Nebulization TID    Social History   Tobacco Use  . Smoking status: Never Smoker  . Smokeless tobacco: Never Used  Substance Use Topics  . Alcohol use: Not Currently    Comment: occasionally  . Drug use: No    Family History  Problem Relation Age of Onset  . Heart attack Father   . Heart disease Brother        1/8  . Heart disease Brother        2/8  . Heart disease Brother        3/8  . Heart disease Brother        4/8  . Heart disease Brother        5/8  . Heart disease Brother        6/8  . Cancer Brother        7/8  . Seizures Brother  8/8  . Stroke Brother        8/8  . Heart disease Sister        1/6  . Alzheimer's disease Sister        2/6  . Diabetes Sister        3/6  . Hypertension Sister        3/6  . Hypertension Sister        4/6  . Diabetes Sister        4/6  . Diabetes Daughter      Review of Systems  Constitutional: Negative for fever, chills, diaphoresis, activity change, appetite change, fatigue and unexpected weight change.  HENT: Negative for congestion, sore throat, rhinorrhea, sneezing, trouble swallowing and sinus pressure.  Eyes: Negative for photophobia and visual disturbance.  Respiratory: positive for cough, chest tightness, shortness of breath, wheezing and stridor.  Cardiovascular: Negative for chest pain, palpitations and leg swelling.  Gastrointestinal:+ constipation. Negative for nausea, vomiting, abdominal pain, diarrhea, blood in stool, abdominal distention and anal bleeding.  Genitourinary: Negative for dysuria, hematuria, flank pain and difficulty urinating.  Musculoskeletal: Negative  for myalgias, back pain, joint swelling, arthralgias and gait problem.  Skin: Negative for color change, pallor, rash and wound.  Neurological: Negative for dizziness, tremors, weakness and light-headedness.  Hematological: Negative for adenopathy. Does not bruise/bleed easily.  Psychiatric/Behavioral: Negative for behavioral problems, confusion, sleep disturbance, dysphoric mood, decreased concentration and agitation.     OBJECTIVE: Temp:  [98.1 F (36.7 C)-101.7 F (38.7 C)] 101 F (38.3 C) (01/27 1419) Pulse Rate:  [78-98] 97 (01/27 1419) Resp:  [16-28] 28 (01/27 1419) BP: (107-136)/(55-74) 135/69 (01/27 1419) SpO2:  [91 %-98 %] 95 % (01/27 1433) Physical Exam  Constitutional:  oriented to person, place, and time. appears well-developed and well-nourished. No distress.  HENT: [place]/AT, PERRLA, no scleral icterus Mouth/Throat: Oropharynx is clear and moist. No oropharyngeal exudate.  Cardiovascular: Normal rate, regular rhythm and normal heart sounds. No m/g/r Pulm= CTAB mild exp wheeze, no rhonchi Neck = supple, no nuchal rigidity Abdominal: Soft. Bowel sounds are normal.  exhibits no distension. There is no tenderness.  Lymphadenopathy: no cervical adenopathy. No axillary adenopathy Neurological: alert and oriented to person, place, and time.  Skin: Skin is warm and dry. No rash noted. No erythema.  Psychiatric: a normal mood and affect.  behavior is normal.    LABS: Results for orders placed or performed during the hospital encounter of 01/29/18 (from the past 48 hour(s))  Glucose, capillary     Status: Abnormal   Collection Time: 02/01/18  4:11 PM  Result Value Ref Range   Glucose-Capillary 180 (H) 70 - 99 mg/dL  Glucose, capillary     Status: Abnormal   Collection Time: 02/01/18  9:09 PM  Result Value Ref Range   Glucose-Capillary 136 (H) 70 - 99 mg/dL  CBC     Status: Abnormal   Collection Time: 02/02/18  4:15 AM  Result Value Ref Range   WBC 7.4 4.0 - 10.5 K/uL    RBC 2.79 (L) 3.87 - 5.11 MIL/uL   Hemoglobin 8.2 (L) 12.0 - 15.0 g/dL   HCT 26.1 (L) 36.0 - 46.0 %   MCV 93.5 80.0 - 100.0 fL   MCH 29.4 26.0 - 34.0 pg   MCHC 31.4 30.0 - 36.0 g/dL   RDW 15.3 11.5 - 15.5 %   Platelets 255 150 - 400 K/uL   nRBC 0.0 0.0 - 0.2 %    Comment: Performed at [facility]  [place] [address], [place], [place] [address]  Basic metabolic panel     Status: Abnormal   Collection Time: 02/02/18  4:15 AM  Result Value Ref Range   Sodium 135 135 - 145 mmol/L   Potassium 3.8 3.5 - 5.1 mmol/L   Chloride 105 98 - 111 mmol/L   CO2 21 (L) 22 - 32 mmol/L   Glucose, Bld 117 (H) 70 - 99 mg/dL   BUN 11 8 - 23 mg/dL   Creatinine, Ser 0.70 0.44 - 1.00 mg/dL   Calcium 7.5 (L) 8.9 - 10.3 mg/dL   GFR calc non Af Amer >60 >60 mL/min   GFR calc Af Amer >60 >60 mL/min   Anion gap 9 5 - 15    Comment: Performed at [facility], [place] [address] [place], [place] [address]  Glucose, capillary     Status: Abnormal   Collection Time: 02/02/18  7:32 AM  Result Value Ref Range   Glucose-Capillary 115 (H) 70 - 99 mg/dL  Glucose, capillary     Status: Abnormal   Collection Time: 02/02/18 12:23 PM  Result Value Ref Range   Glucose-Capillary 168 (H) 70 - 99 mg/dL  Glucose, capillary     Status: Abnormal   Collection Time: 02/02/18  4:10 PM  Result Value Ref Range   Glucose-Capillary 183 (H) 70 - 99 mg/dL  Glucose, capillary     Status: Abnormal   Collection Time: 02/02/18  9:45 PM  Result Value Ref Range   Glucose-Capillary 154 (H) 70 - 99 mg/dL  Basic metabolic panel     Status: Abnormal   Collection Time: 02/03/18  8:00 AM  Result Value Ref Range   Sodium 137 135 - 145 mmol/L   Potassium 4.5 3.5 - 5.1 mmol/L   Chloride 107 98 - 111 mmol/L   CO2 21 (L) 22 - 32 mmol/L   Glucose, Bld 112 (H) 70 - 99 mg/dL   BUN 10 8 - 23 mg/dL   Creatinine, Ser 0.87 0.44 - 1.00 mg/dL   Calcium 8.2 (L) 8.9 - 10.3 mg/dL   GFR calc non Af Amer >60 >60 mL/min    GFR calc Af Amer >60 >60 mL/min   Anion gap 9 5 - 15    Comment: Performed at [facility], [place] [address], [place], [place] [address]  CBC     Status: Abnormal   Collection Time: 02/03/18  8:00 AM  Result Value Ref Range   WBC 8.5 4.0 - 10.5 K/uL   RBC 3.47 (L) 3.87 - 5.11 MIL/uL   Hemoglobin 10.1 (L) 12.0 - 15.0 g/dL   HCT 31.2 (L) 36.0 - 46.0 %   MCV 89.9 80.0 - 100.0 fL   MCH 29.1 26.0 - 34.0 pg   MCHC 32.4 30.0 - 36.0 g/dL   RDW 15.5 11.5 - 15.5 %   Platelets 305 150 - 400 K/uL   nRBC 0.0 0.0 - 0.2 %    Comment: Performed at [facility], [place] [address], [place], [place] [address]  Glucose, capillary     Status: Abnormal   Collection Time: 02/03/18  8:09 AM  Result Value Ref Range   Glucose-Capillary 119 (H) 70 - 99 mg/dL  Glucose, capillary     Status: Abnormal   Collection Time: 02/03/18 12:18 PM  Result Value Ref Range   Glucose-Capillary 155 (H) 70 - 99 mg/dL    MICRO: negative IMAGING: No results found.  Assessment/Plan:  73yo F with metastatic adenoca of lung admitted for fever, rigors, AMS, and cough being treated for HCAP however, no improvement in fever curve. No leukocytosis  - imaging suggests that she may have secondary process may not be bacterial process. Wonder if she has element of pneumonitis which would also cause cough, fever, and changes on imaging  HCAP = on day 6 of 7. Will change to piptazo to see if any difference with the thought of treating post obstructive pneumonia.  Immunocompromised host = consider discussing with oncology what would be possibility for pneumonitis. And would steroids be the next step?

## 2018-02-03 NOTE — Progress Notes (Addendum)
HEMATOLOGY/ONCOLOGY INPATIENT PROGRESS NOTE  Date of Service: 02/05/2018  Inpatient Attending: .Hosie Poisson, MD   SUBJECTIVE:   Elizabeth Calhoun is accompanied today by her daughter and niece at bedside. The pt reports that she is doing well overall.   The pt reports that she is not feeling fatigued. The pt denies concerns for choking, and does not cough while eating. The pt notes that she doesn't want to eat everything that is available, but some foods that she wants to eat she finds herself able to eat. The pt notes that she felt SOB "a little bit," after ambulating earlier. She denies coughing up phlegm.  Lab results today (02/05/18 ) of CBC and BMP is as follows: all values are WNL except for RBC at 2.81, HGB at 8.1, HCT at 26.9, RDW at 15.7, Chloride at 112, CO2 at 21, Calcium at 7.9.  On review of systems, pt reports cough, some SOB while ambulating, eating some, and denies phlegm production, fatigue, and any other symptoms.   OBJECTIVE:  NAD  PHYSICAL EXAMINATION: . Vitals:   02/05/18 0835 02/05/18 1011 02/05/18 1200 02/05/18 1428  BP:   (!) 117/59   Pulse:   87   Resp:   18   Temp: 100.1 F (37.8 C) (!) 101 F (38.3 C) 99.3 F (37.4 C)   TempSrc: Oral Oral Oral   SpO2:   97% 95%  Weight:      Height:       Filed Weights   01/30/18 1643  Weight: 144 lb 2.9 oz (65.4 kg)   .Body mass index is 28.16 kg/m.  GENERAL:alert, in no acute distress and comfortable SKIN: skin color, texture, turgor are normal, no rashes or significant lesions EYES: normal, conjunctiva are pink and non-injected, sclera clear OROPHARYNX:no exudate, no erythema and lips, buccal mucosa, and tongue normal  NECK: supple, no JVD, thyroid normal size, non-tender, without nodularity LYMPH:  no palpable lymphadenopathy in the cervical, axillary or inguinal LUNGS: crackles bilaterally at lung bases, scattered wheezes  HEART: mild tachycardia, regular rate & rhythm,  no murmurs and no lower  extremity edema ABDOMEN: abdomen soft, non-tender, normoactive bowel sounds  Musculoskeletal: no cyanosis of digits and no clubbing  PSYCH: alert & oriented x 3 with fluent speech NEURO: no focal motor/sensory deficits  MEDICAL HISTORY:  Past Medical History:  Diagnosis Date  . Adenomatous colon polyp   . Cancer ([place])   . Diabetes mellitus without complication ([place])   . Hemorrhoids   . Menopause   . OA (osteoarthritis)   . Obesity   . Other and unspecified hyperlipidemia   . Spine metastasis ([place])   . Stage 4 lung cancer ([place])   . Stroke [facility])     SURGICAL HISTORY: Past Surgical History:  Procedure Laterality Date  . HYSTERECTOMY ABDOMINAL WITH SALPINGO-OOPHORECTOMY  1990  . KNEE ARTHROSCOPY Left 2009  . SHOULDER SURGERY  2005    SOCIAL HISTORY: Social History   Socioeconomic History  . Marital status: Widowed    Spouse name: Not on file  . Number of children: 3  . Years of education: Not on file  . Highest education level: Not on file  Occupational History  . Occupation: works at [employer]  . Financial resource strain: Not hard at all  . Food insecurity:    Worry: Patient refused    Inability: Patient refused  . Transportation needs:    Medical: Patient refused    Non-medical: Patient refused  Tobacco Use  .  Smoking status: Never Smoker  . Smokeless tobacco: Never Used  Substance and Sexual Activity  . Alcohol use: Not Currently    Comment: occasionally  . Drug use: No  . Sexual activity: Not on file  Lifestyle  . Physical activity:    Days per week: Patient refused    Minutes per session: Patient refused  . Stress: Only a little  Relationships  . Social connections:    Talks on phone: More than three times a week    Gets together: Not on file    Attends religious service: More than 4 times per year    Active member of club or organization: Yes    Attends meetings of clubs or organizations: More than 4 times per year    Relationship  status: Widowed  . Intimate partner violence:    Fear of current or ex partner: Patient refused    Emotionally abused: Patient refused    Physically abused: Patient refused    Forced sexual activity: Patient refused  Other Topics Concern  . Not on file  Social History Narrative  . Not on file    FAMILY HISTORY: Family History  Problem Relation Age of Onset  . Heart attack Father   . Heart disease Brother        1/8  . Heart disease Brother        2/8  . Heart disease Brother        3/8  . Heart disease Brother        4/8  . Heart disease Brother        5/8  . Heart disease Brother        6/8  . Cancer Brother        7/8  . Seizures Brother        8/8  . Stroke Brother        8/8  . Heart disease Sister        1/6  . Alzheimer's disease Sister        2/6  . Diabetes Sister        3/6  . Hypertension Sister        3/6  . Hypertension Sister        4/6  . Diabetes Sister        4/6  . Diabetes Daughter     ALLERGIES:  is allergic to aspirin.  MEDICATIONS:  Scheduled Meds: . feeding supplement (GLUCERNA SHAKE)  237 mL Oral TID BM  . fentaNYL  1 patch Transdermal Q72H  . guaiFENesin  1,200 mg Oral BID  . insulin aspart  0-9 Units Subcutaneous TID WC  . insulin glargine  5 Units Subcutaneous QHS  . ipratropium-albuterol  3 mL Nebulization TID  . losartan  25 mg Oral Daily  . mouth rinse  15 mL Mouth Rinse BID  . Rivaroxaban  15 mg Oral BID WC  . [START ON 02/11/2018] rivaroxaban  20 mg Oral Q supper  . rosuvastatin  40 mg Oral q1800   Continuous Infusions: . sodium chloride 50 mL/hr at 02/04/18 2213  . piperacillin-tazobactam (ZOSYN)  IV 3.375 g (02/05/18 1453)   PRN Meds:.acetaminophen **OR** acetaminophen, albuterol, benzonatate, bisacodyl, chlorpheniramine-HYDROcodone, guaiFENesin, HYDROmorphone, ondansetron **OR** ondansetron (ZOFRAN) IV, polyethylene glycol, sorbitol, zolpidem  REVIEW OF SYSTEMS:    10 Point review of Systems was done is negative  except as noted above.   LABORATORY DATA:  I have reviewed the data as listed  . CBC Latest Ref  Rng & Units 02/05/2018 02/04/2018 02/04/2018  WBC 4.0 - 10.5 K/uL 6.1 - 5.7  Hemoglobin 12.0 - 15.0 g/dL 8.1(L) 7.8(L) 7.7(L)  Hematocrit 36.0 - 46.0 % 26.9(L) 25.3(L) 25.2(L)  Platelets 150 - 400 K/uL 340 - 301    . CMP Latest Ref Rng & Units 02/05/2018 02/04/2018 02/03/2018  Glucose 70 - 99 mg/dL 91 154(H) 112(H)  BUN 8 - 23 mg/dL '10 12 10  ' Creatinine 0.44 - 1.00 mg/dL 0.91 0.86 0.87  Sodium 135 - 145 mmol/L 139 135 137  Potassium 3.5 - 5.1 mmol/L 3.6 3.9 4.5  Chloride 98 - 111 mmol/L 112(H) 107 107  CO2 22 - 32 mmol/L 21(L) 19(L) 21(L)  Calcium 8.9 - 10.3 mg/dL 7.9(L) 7.9(L) 8.2(L)  Total Protein 6.5 - 8.1 g/dL - - -  Total Bilirubin 0.3 - 1.2 mg/dL - - -  Alkaline Phos 38 - 126 U/L - - -  AST 15 - 41 U/L - - -  ALT 0 - 44 U/L - - -        RADIOGRAPHIC STUDIES: I have personally reviewed the radiological images as listed and agreed with the findings in the report. Dg Chest 2 View  Result Date: 02/05/2018 CLINICAL DATA:  Fever, lung cancer EXAM: CHEST - 2 VIEW COMPARISON:  02/01/2018 Correlation: CT chest 01/30/2018 FINDINGS: Enlargement of cardiac silhouette. Atherosclerotic calcification aorta. Mediastinal contours and pulmonary vascularity normal. BILATERAL pulmonary infiltrates consistent with pneumonia. This includes a focal area of opacity in LEFT mid lung similar to the previous exam. RIGHT lung infiltrates increased since previous study and now with small RIGHT pleural effusion. Medial LEFT apical neoplasm identified by previous imaging is not well visualized by this study. No pneumothorax. Bones demineralized with multilevel endplate spur formation thoracic spine. IMPRESSION: COPD changes with BILATERAL pulmonary infiltrates consistent with pneumonia, progressive in lower RIGHT lung since previous exam and now associated with a small RIGHT pleural effusion. Known LEFT apical  neoplasm, poorly visualized. Electronically Signed   By: Lavonia Dana M.D.   On: 02/05/2018 14:12   Dg Chest 2 View  Result Date: 01/29/2018 CLINICAL DATA:  Loss of appetite. Stage IV lung cancer. EXAM: CHEST - 2 VIEW COMPARISON:  01/19/2018 CT FINDINGS: Low lung volumes. Borderline cardiomegaly with aortic atherosclerosis. Known spiculated AP window nodal mass as seen on prior CT measuring up to 2.3 cm radiographically likely accounts for the pulmonary opacity seen in the region of the AP window. Central vascular congestion is otherwise noted bilaterally. No aggressive osseous lesions. Osteoarthritis of the [facility] and glenohumeral joints. IMPRESSION: Low lung volumes with borderline cardiomegaly and aortic atherosclerosis. Mild central vascular congestion is noted. Spiculated masslike opacity in the AP window compatible with known nodal mass on recent CT. Electronically Signed   By: Ashley Royalty M.D.   On: 01/29/2018 22:29   Dg Chest 2 View  Result Date: 01/19/2018 CLINICAL DATA:  Shortness of breath. EXAM: CHEST - 2 VIEW COMPARISON:  PET scan of December 10, 2017. Radiographs of December 07, 2017. FINDINGS: Stable cardiac silhouette. Left hilar spiculated abnormality is noted consistent with malignancy as described on PET scan. No pneumothorax is noted. No pleural effusion is noted. Elevated left hemidiaphragm is noted. Right lung is clear. No acute consolidative process is noted. Bony thorax is unremarkable. IMPRESSION: Stable left hilar spiculated density is noted consistent with malignancy as described on PET scan. No significant changes noted compared to prior exam. Electronically Signed   By: Marijo Conception, M.D.   On:  01/19/2018 14:02   Ct Angio Chest Pe W And/or Wo Contrast  Result Date: 01/30/2018 CLINICAL DATA:  73 year old female with failure to thrive. EXAM: CT ANGIOGRAPHY CHEST WITH CONTRAST TECHNIQUE: Multidetector CT imaging of the chest was performed using the standard protocol during bolus  administration of intravenous contrast. Multiplanar CT image reconstructions and MIPs were obtained to evaluate the vascular anatomy. CONTRAST:  37m ISOVUE-370 IOPAMIDOL (ISOVUE-370) INJECTION 76% COMPARISON:  Chest radiograph dated 01/29/2018 and CT dated 01/19/2018 FINDINGS: Cardiovascular: There is mild cardiomegaly. No pericardial effusion. There is multi vessel coronary vascular calcification. Mild atherosclerotic calcification of the thoracic aorta. No aneurysmal dilatation or evidence of dissection. There is mild dilatation of the main pulmonary trunk suggestive of a degree of pulmonary hypertension. Clinical correlation is recommended. Residual embolus noted in the right lower lobe segmental branch pulmonary artery as well as in the right upper lobe segmental branch. No new pulmonary artery embolus identified. There is high-grade narrowing of the left main pulmonary artery secondary to hilar mass/metastasis. Mediastinum/Nodes: Spiculated left hilar/suprahilar mass/metastasis measuring 3.8 x 3.0 cm similar to prior CT. There is encasement and narrowing of the left main pulmonary artery. There is diffuse thickening of the left mediastinal pleural surface. Right paratracheal lymph node measures 9 mm short axis. No mediastinal fluid collection. Lungs/Pleura: A 2.7 x 2.6 cm left apical spiculated mass as well as smaller adjacent nodular densities, likely satellite lesions. There is diffuse ground-glass airspace density and nodularity right greater left, new or worsened since the prior CT. The rapid progression since the prior CT suggest an acute etiology such as edema or pneumonia. Lymphangitic spread of tumor is considered less likely given rapid interval development. Clinical correlation and follow-up recommended. No large pleural effusion. There is no pneumothorax. The central airways are patent. Upper Abdomen: No acute abnormality. Musculoskeletal: Osteopenia with degenerative changes of the spine. Diffuse  sclerotic lesions throughout the spine most consistent with metastatic disease. There is a lytic lesion involving the anterior half of T11 consistent with metastatic disease. Additional lytic metastatic disease involving the left scapula as seen previously. No acute pathologic fracture. Review of the MIP images confirms the above findings. IMPRESSION: 1. Interval development of bilateral ground-glass and nodular density most consistent with edema or pneumonia. Clinical correlation is recommended. 2. Left apical malignancy with nodal and osseous metastatic disease as seen previously. 3. Encasement and high-grade narrowing of the left main pulmonary artery by the hilar mass/metastatic node. 4. Residual pulmonary artery emboli similar to prior CT.  No new PE. Electronically Signed   By: AAnner CreteM.D.   On: 01/30/2018 02:24   Ct Angio Chest Pe W/cm &/or Wo Cm  Result Date: 01/19/2018 CLINICAL DATA:  Worsening shortness of breath over the past few days. Recently diagnosed with metastatic lung cancer. EXAM: CT ANGIOGRAPHY CHEST WITH CONTRAST TECHNIQUE: Multidetector CT imaging of the chest was performed using the standard protocol during bolus administration of intravenous contrast. Multiplanar CT image reconstructions and MIPs were obtained to evaluate the vascular anatomy. CONTRAST:  836mISOVUE-370 IOPAMIDOL (ISOVUE-370) INJECTION 76% COMPARISON:  PET-CT dated December 10, 2017. CT chest dated December 07, 2017. FINDINGS: Cardiovascular: Acute segmental pulmonary emboli within the right upper and lower lobes. No central or lobar pulmonary emboli. Elevated RV/LV ratio of 1.6. No pericardial effusion. No thoracic aortic aneurysm or dissection. Coronary, aortic arch, and branch vessel atherosclerotic vascular disease. Mediastinum/Nodes: Unchanged aortopulmonary window nodal mass measuring 3.8 x 4.0 cm, this again completely encases the left pulmonary artery and left  upper lobe bronchus. Other scattered  mediastinal lymph nodes measuring up to 1 cm in short axis are unchanged. Stable to slightly decreased left hilar lymph node measuring 1.3 cm, previously 1.5 cm. No axillary right hilar lymphadenopathy. The thyroid gland, trachea, and esophagus demonstrate no significant findings. Lungs/Pleura: Unchanged 3.1 cm mass in the left apical of lobe. Additional scattered nodules in the left upper lobe are also unchanged. Stable multifocal pleural nodularity in the left hemithorax. No pleural effusion or pneumothorax. Upper Abdomen: No acute abnormality. Musculoskeletal: Unchanged lytic metastases involving the left scapula, left T2 pedicle and transverse process, and T11 vertebral body. Review of the MIP images confirms the above findings. IMPRESSION: 1. Acute segmental pulmonary emboli within the right upper and lower lobes. Evidence of right heart strain. 2. Unchanged 3.1 cm left apical lung mass with left upper lobe, left pleural, nodal, and osseous metastatic disease. The 4.0 cm aortopulmonary window nodal mass severely narrows the left pulmonary artery and left upper lobe bronchus. 3. Aortic atherosclerosis (ICD10-I70.0). Critical Value/emergent results were called by telephone at the time of interpretation on 01/19/2018 at 5:17 pm to Dr. Sherry Ruffing, who verbally acknowledged these results. Electronically Signed   By: Titus Dubin M.D.   On: 01/19/2018 17:26   Dg Chest Port 1 View  Result Date: 02/01/2018 CLINICAL DATA:  Stage IV primary lung cancer. Hypoxia. EXAM: PORTABLE CHEST 1 VIEW COMPARISON:  Chest x-ray dated 01/29/2018. Chest CT dated 01/30/2018. FINDINGS: New rounded opacity within the LEFT perihilar lung measures 4 cm greatest dimension. Increased interstitial markings noted within the RIGHT perihilar lung. No pleural effusion or pneumothorax seen. Heart size is stable. Osseous structures about the chest are unremarkable. IMPRESSION: 1. New rounded opacity within the LEFT perihilar lung, measuring 4 cm  greatest dimension. The rapid development suggests pneumonia, possibly postobstructive pneumonia given the perihilar mass/metastasis demonstrated on recent chest CT of 01/30/2018 with associated encasement of the LEFT main pulmonary artery. This new opacity is less likely asymmetric pulmonary edema. 2. Increased interstitial markings within the RIGHT perihilar lung, corresponding to the ground-glass opacities described on recent CT suggesting edema or atypical pneumonia. 3. LEFT hilar opacity, compatible with the hilar mass/metastatic lymph node described on recent chest CT report. Electronically Signed   By: Franki Cabot M.D.   On: 02/01/2018 10:24    ASSESSMENT & PLAN:  73 y.o. female with  1. Lung Cancer, Metastatic to bone  Patient's labs upon presentation from 06/12/17, mild anemia with HGB at 11.6, no other cytopenias.   11/15/17 MRI Lumbar revealed Widespread marrow lesions consistent with metastatic disease. 2. Pathologic endplate fractures at L2. 3. Concerning left leg radiculopathy, there is far-lateral tumor infiltration at L2 which could affect extraforaminal nerve roots. No epidural tumor extension.   12/10/17 PET/CT revealed Large LEFT upper lobe nodule most consistent with bronchogenic Carcinoma. 2. LEFT suprahilar nodal mass which obstructs the LEFT upper lobe bronchus and extends into the AP window consistent with nodal Metastasis. 3. Additional nodal metastasis in the mediastinum. 4. Distant skeletal metastasis involving the proximal femur, sacrum, multiple lesions in the thoracic and lumbar spine, ribs, and scapula. Favor skeletal metastasis from bronchogenic carcinoma over myeloma.   12/04/17 BM Bx revealed no evidence of malignancy   Palliative RT with Dr. [name] for RT for T3 related pain, lung and hip 12/24/17-01/14/17.   12/26/17 Brain MRI showed 1. Single 5 mm focus of enhancement within right anterior basal ganglia, likely metastasis of lung cancer. 2. Progression of  advanced chronic microvascular ischemic  changes in stable moderate volume loss of the brain from 2017. Multiple stable chronic infarcts as above.  2. Fever likely due to Pneumonia -- less likely primary tumor fevers in the setting of new infiltrates  3. Pulmonary embolism due to metastatic lung cancer -- on Xarelto.  4. Brain lesion -concerning for early asymptomatic brain met. PLAN -Discussed pt labwork today, 02/05/18; HGB at 8.1, WBC normal at 6.1k, PLT at 340k -If possible and pt is afebrile, would recommend 1 unit PRBC to keep hemodynamically stable with HGB >9 -Discussed the 02/05/18 CXR which revealed COPD changes with BILATERAL pulmonary infiltrates consistent with pneumonia, progressive in lower RIGHT lung since previous exam and now associated with a small RIGHT pleural effusion. Known LEFT apical neoplasm, poorly visualized -Defer to ID and Pulm for broad spectrum antibiotics and possibly bronchoscopy respectively -100% PDL-1 expression, will proceed with immunotherapy once pt is stable and out of hospital -no other targetable mutations (EGFR, ALK, ROS1 and ALK negative on foundation one). -Goal of O2 sat at 90% and not feeling overtly SOB- will need determination for home O2 prior to discharge. -no plans for oncologic intervention in the hospital. -will need rpt MRI brain in 2 weeks likely as outpatient to f/u 79m rt basal ganglia lesion and rpt  Rad onc input if worsening..   The total time spent in the appt was 35 minutes and more than 50% was on counseling and direct patient cares.    GSullivan LoneMD MS AAHIVMS SLake Tahoe Surgery CenterCPalmetto Surgery Center LLCHematology/Oncology Physician [facility] (Office):       [phone](Work cell):  [phone](Fax):           [phone] 02/05/2018 3:56 PM   I, SBaldwin Jamaica am acting as a scribe for Dr. GSullivan Lone   .I have reviewed the above documentation for accuracy and completeness, and I agree with the above. GSullivan LoneMD MS

## 2018-02-03 NOTE — Telephone Encounter (Signed)
Called patient per 1/24 sch message - unable to reach patient - left message for patient to call back .

## 2018-02-03 NOTE — Care Management Important Message (Signed)
Important Message  Patient Details  Name: Elizabeth Calhoun MRN: [id number] Date of Birth: Aug 18, 1945   Medicare Important Message Given:  Yes    Kerin Salen 02/03/2018, 12:51 [place] Message  Patient Details  Name: Elizabeth Calhoun MRN: [id number] Date of Birth: 16-Aug-1945   Medicare Important Message Given:  Yes    Kerin Salen 02/03/2018, 12:51 PM

## 2018-02-03 NOTE — Progress Notes (Signed)
PHARMACY NOTE -  [place] has been assisting with dosing of Zosyn for Aspiration PNA.  Dosage remains stable at 3.375 g IV q8 hr and need for further dosage adjustment appears unlikely at present  Pharmacy will sign off, following peripherally for culture results or dose adjustments. Please reconsult if a change in clinical status warrants re-evaluation of dosage.  Reuel Boom, PharmD, BCPS [phone] 02/03/2018, 6:03 PM

## 2018-02-03 NOTE — Progress Notes (Signed)
PROGRESS NOTE  Elizabeth Calhoun LTJ:[id number] DOB: 09-10-1945 DOA: 01/29/2018 PCP: Seward Carol, MD  HPI/Recap of past 24 hours: Elizabeth Calhoun is a 73 y.o. female with medical history significant of hypertension, hyperlipidemia, diabetes mellitus, stroke, recently diagnosed PE on Xarelto, metastatic lung cancer (s/p of palliative radiation therapy), who presents with generalized weakness, poor appetite, fever and chills, mild confusion.  01/30/2018: Patient seen and examined with her 2 daughters at bedside.  States she is feeling a little better.  She has persistent nonproductive cough.  Independently reviewed her CT chest which revealed known lung mass left apical. and bilateral nodular densities.  Lactic acid, procalcitonin, WBC unremarkable.  No recurrence of fever.  Influenza a and B negative.  Respiratory viral panel in process.  Possible radiation pneumonitis? discussed with radiation oncology Dr. [name] who will see the patient in consultation.  02/01/2018: Seen and examined with 1 of 2 daughters at bedside.  She gets short winded with minimal conversation.  She has persistent cough and still requiring O2 supplementation with no O2 supplement at baseline.  Obtain portable chest x-ray to further assess.  02/02/18: Seen and examined with her daughter Ady at bedside.  No new complaints.  Blood pressure mildly elevated this morning we will restart losartan and closely monitor her creatinine and sodium level.  02/03/18: Patient seen and examined with her daughter [name] at bedside.  Febrile overnight with T-max of 101.  On IV antibiotics empirically.  No leukocytosis.  She has no new complaints.   Assessment/Plan: Principal Problem:   Sepsis ([place]) Active Problems:   Bone metastasis ([place])   Pulmonary embolism (HCC)   Lung cancer (HCC)   HTN (hypertension)   HLD (hyperlipidemia)   Hyponatremia   Stroke ([place])   Normocytic anemia   Diabetes mellitus without complication ([place])  Acute metabolic encephalopathy   Palliative care by specialist   Goals of care, counseling/discussion  Sepsis ([place]) of unclear etiology:  Presented with fever, tachycardia, tachypnea, hypotension.   Blood pressure responded to IV fluid and is stabilized now.   Procalcitonin and lactic acid unremarkable Urine analysis unremarkable Independently reviewed chest x-ray and CT chest which revealed bilateral infiltrates and diffuse groundglass airspace density and nodularity right greater than left She has ongoing recurrent fevers with no leukocytosis Continue cefepime empirically day #5 DC IV vancomycin due to MRSA screening PCR negative Does not appear septic  Recurrent fevers Possibly from malignancy Overnight fever with T-max of 101 Blood cultures negative to date No leukocytosis On IV cefepime empirically  Hypertension Blood pressure is now stable Blood pressure mildly elevated on 02/02/2018 Restart losartan on 02/02/2018 Monitor renal function and hyponatremia  Recent hyponatremia Continue to hold HCTZ Restarted losartan on 02/02/2018 due to mildly elevated blood pressure We will monitor creatinine and sodium level Obtain BMP in the morning  Recent hx of Plmonary embolism ([place]): -on Xarelto  Left lung cancer with bone metastasis: per previous discharge summary, "She does have a large left upper lobe nodule, most consistent with bronchogenic carcinoma,left suprahilar nodal mass which was obstructing the left upper lobe bronchus.Positive distant skeletal metastasis involving the proximal femur, sacrum and multiple lesions in thoracic lumbar spine, ribs and scapula. She has received palliative radiation therapy,and a cervical lymph node biopsy positive for adenocarcinoma". Pt is followed by pt Dr. [name]. -Discussed with Dr. [name] on 01/30/18 Repeat chest x-ray done on 02/01/2018 revealed new rounded opacity within the left perihilar line measuring 4 cm in greatest dimension  compatible with hilar mass/metastatic lymph node  described on recent chest CT report.  HLD (hyperlipidemia): -crestor   Resolved Hyponatremia: Continue to hold HCTZ  Hx of Stroke [facility]): -on Crestor and xarelto  Normocytic anemia: Hemoglobin stable at 10.1.  No sign of overt bleeding.   Diabetes mellitus with hyperglycemia ([place]):  Last A1c 9.0. C/w lantus and ISS  Acute metabolic encephalopathy: Resolved  Physical debility: PT recommended home health PT and rolling walker with 5 inches wheels.  We will consult OT.  Moderate protein calorie malnutrition Albumin 2.9 on 01/29/2018 BMI 28 Continue oral supplement Glucerna 3 times daily  History of CVA and lesion in the brain Independently reviewed MRI done on 12/26/2017 which revealed single 5 mm focus of enhancement within the right anterior basal ganglia likely metastasis Progression of advanced chronic microvascular ischemic changes and stable moderate volume loss of the brain, multiple stable chronic infarcts as above  Chronic constipation Start bowel regimen with Senokot 2 tablets twice daily, MiraLAX daily, Dulcolax suppository as needed  DVT ppx: on Xarelto Code Status: Full code Family Communication:  Yes, patient's daughter at bedside. All questions answered to their satisfaction. Disposition Plan:  Anticipate discharge back to previous home environment Consults called:   Discussed with radiation oncology Dr. [name] and medical oncology Dr. [name] on 01/30/2018.     Objective: Vitals:   02/03/18 0106 02/03/18 0442 02/03/18 0614 02/03/18 0812  BP:  (!) 107/55    Pulse:  78    Resp:  16    Temp: (!) 100.9 F (38.3 C) 98.5 F (36.9 C) 98.1 F (36.7 C)   TempSrc: Rectal Oral Rectal   SpO2:  97%  97%  Weight:      Height:        Intake/Output Summary (Last 24 hours) at 02/03/2018 1002 Last data filed at 02/03/2018 0813 Gross per 24 hour  Intake 1176.87 ml  Output 1100 ml  Net 76.87 ml   Filed Weights    01/30/18 1643  Weight: 65.4 kg    Exam:  . General: 73 y.o. year-old female well-developed well-nourished in no acute distress.  Alert and oriented x3.   . Cardiovascular: Regular rate and rhythm with no rubs or gallops.  No JVD or thyromegaly noted.  Respiratory: Mild rales at bases with no wheezes.  Good inspiratory effort. . Abdomen: Soft nontender nondistended with normal bowel sounds x4 quadrants. . Musculoskeletal: No edema. 2/4 pulses in all 4 extremities. Marland Kitchen Psychiatry: Mood is appropriate for condition and setting   Data Reviewed: CBC: Recent Labs  Lab 01/29/18 2150  01/31/18 0345 01/31/18 1718 02/01/18 0421 02/02/18 0415 02/03/18 0800  WBC 8.0   < > 6.6 8.6 6.8 7.4 8.5  NEUTROABS 5.7  --   --  6.0  --   --   --   HGB 9.2*   < > 8.4* 7.0* 8.0* 8.2* 10.1*  HCT 28.6*   < > 26.7* 23.2* 26.0* 26.1* 31.2*  MCV 91.7   < > 94.0 96.7 94.2 93.5 89.9  PLT 288   < > 251 285 255 255 305   < > = values in this interval not displayed.   Basic Metabolic Panel: Recent Labs  Lab 01/30/18 0545 01/31/18 0345 02/01/18 0421 02/02/18 0415 02/03/18 0800  NA 131* 134* 133* 135 137  K 3.4* 3.8 3.3* 3.8 4.5  CL 103 106 104 105 107  CO2 20* 18* 21* 21* 21*  GLUCOSE 216* 156* 113* 117* 112*  BUN 14 12 10 11 10   CREATININE 0.83 0.86  0.77 0.70 0.87  CALCIUM 7.2* 7.5* 7.5* 7.5* 8.2*   GFR: Estimated Creatinine Clearance: 48.6 mL/min (by C-G formula based on SCr of 0.87 mg/dL). Liver Function Tests: Recent Labs  Lab 01/29/18 2150  AST 28  ALT 19  ALKPHOS 119  BILITOT 0.8  PROT 6.4*  ALBUMIN 2.9*   No results for input(s): LIPASE, AMYLASE in the last 168 hours. No results for input(s): AMMONIA in the last 168 hours. Coagulation Profile: Recent Labs  Lab 01/29/18 2150 01/30/18 0545  INR 4.93* 3.81   Cardiac Enzymes: No results for input(s): CKTOTAL, CKMB, CKMBINDEX, TROPONINI in the last 168 hours. BNP (last 3 results) No results for input(s): PROBNP in the last 8760  hours. HbA1C: No results for input(s): HGBA1C in the last 72 hours. CBG: Recent Labs  Lab 02/02/18 0732 02/02/18 1223 02/02/18 1610 02/02/18 2145 02/03/18 0809  GLUCAP 115* 168* 183* 154* 119*   Lipid Profile: No results for input(s): CHOL, HDL, LDLCALC, TRIG, CHOLHDL, LDLDIRECT in the last 72 hours. Thyroid Function Tests: No results for input(s): TSH, T4TOTAL, FREET4, T3FREE, THYROIDAB in the last 72 hours. Anemia Panel: No results for input(s): VITAMINB12, FOLATE, FERRITIN, TIBC, IRON, RETICCTPCT in the last 72 hours. Urine analysis:    Component Value Date/Time   COLORURINE YELLOW 01/30/2018 0441   APPEARANCEUR HAZY (A) 01/30/2018 0441   LABSPEC 1.020 01/30/2018 0441   PHURINE 5.0 01/30/2018 0441   GLUCOSEU NEGATIVE 01/30/2018 0441   HGBUR SMALL (A) 01/30/2018 0441   BILIRUBINUR NEGATIVE 01/30/2018 0441   KETONESUR NEGATIVE 01/30/2018 0441   PROTEINUR NEGATIVE 01/30/2018 0441   UROBILINOGEN 0.2 01/10/2010 1749   NITRITE NEGATIVE 01/30/2018 0441   LEUKOCYTESUR NEGATIVE 01/30/2018 0441   Sepsis Labs: @LABRCNTIP (procalcitonin:4,lacticidven:4)  ) Recent Results (from the past 240 hour(s))  Culture, blood (Routine x 2)     Status: None (Preliminary result)   Collection Time: 01/29/18  9:50 PM  Result Value Ref Range Status   Specimen Description   Final    BLOOD RIGHT FOREARM Performed at [facility], [place] [address], [place], [place] [address]    Special Requests   Final    BOTTLES DRAWN AEROBIC AND ANAEROBIC Blood Culture adequate volume Performed at [facility] [address], [place], [place] [address]    Culture   Final    NO GROWTH 3 DAYS Performed at [facility], [place] [address], [place], [place] [address]    Report Status PENDING  Incomplete  Culture, blood (Routine x 2)     Status: None (Preliminary result)   Collection Time: 01/29/18  9:55 PM  Result Value Ref Range Status   Specimen Description   Final    BLOOD  LEFT ANTECUBITAL Performed at [facility] [address], [place], [place] [address]    Special Requests   Final    BOTTLES DRAWN AEROBIC AND ANAEROBIC Blood Culture results may not be optimal due to an inadequate volume of blood received in culture bottles Performed at [facility] [address], [place], [place] [address]    Culture   Final    NO GROWTH 3 DAYS Performed at [facility], [place] [address] [place], [place] [address]    Report Status PENDING  Incomplete  Urine Culture     Status: Abnormal   Collection Time: 01/30/18  4:41 AM  Result Value Ref Range Status   Specimen Description   Final    URINE, CLEAN CATCH  Performed at [facility], [place] [address], [place], [place] [address]    Special Requests   Final    NONE Performed at [facility], [place] [address] [place], [place] [address]    Culture MULTIPLE SPECIES PRESENT, SUGGEST RECOLLECTION (A)  Final   Report Status 01/31/2018 FINAL  Final  MRSA PCR Screening     Status: None   Collection Time: 01/30/18  4:55 AM  Result Value Ref Range Status   MRSA by PCR NEGATIVE NEGATIVE Final    Comment:        The GeneXpert MRSA Assay (FDA approved for NASAL specimens only), is one component of a comprehensive MRSA colonization surveillance program. It is not intended to diagnose MRSA infection nor to guide or monitor treatment for MRSA infections. Performed at [facility], [place] [address] [place], [place] [address]   Respiratory Panel by PCR     Status: None   Collection Time: 01/30/18 11:19 AM  Result Value Ref Range Status   Adenovirus NOT DETECTED NOT DETECTED Final   Coronavirus 229E NOT DETECTED NOT DETECTED Final   Coronavirus HKU1 NOT DETECTED NOT DETECTED Final   Coronavirus NL63 NOT DETECTED NOT DETECTED Final   Coronavirus OC43 NOT DETECTED NOT DETECTED Final   Metapneumovirus NOT DETECTED NOT DETECTED  Final   Rhinovirus / Enterovirus NOT DETECTED NOT DETECTED Final   Influenza A NOT DETECTED NOT DETECTED Final   Influenza B NOT DETECTED NOT DETECTED Final   Parainfluenza Virus 1 NOT DETECTED NOT DETECTED Final   Parainfluenza Virus 2 NOT DETECTED NOT DETECTED Final   Parainfluenza Virus 3 NOT DETECTED NOT DETECTED Final   Parainfluenza Virus 4 NOT DETECTED NOT DETECTED Final   Respiratory Syncytial Virus NOT DETECTED NOT DETECTED Final   Bordetella pertussis NOT DETECTED NOT DETECTED Final   Chlamydophila pneumoniae NOT DETECTED NOT DETECTED Final   Mycoplasma pneumoniae NOT DETECTED NOT DETECTED Final    Comment: Performed at [facility], [place] [address] [place] [address], [place], [place] [address]  Culture, blood (routine x 2)     Status: None (Preliminary result)   Collection Time: 01/31/18  5:18 PM  Result Value Ref Range Status   Specimen Description   Final    BLOOD LEFT ARM Performed at [facility] [address], [place], [place] [address]    Special Requests   Final    BOTTLES DRAWN AEROBIC ONLY Blood Culture adequate volume Performed at [facility] [address], [place], [place] [address]    Culture   Final    NO GROWTH 1 DAY Performed at [facility], [place] [address], [place], [place] [address]    Report Status PENDING  Incomplete  Culture, blood (routine x 2)     Status: None (Preliminary result)   Collection Time: 01/31/18  5:18 PM  Result Value Ref Range Status   Specimen Description   Final    BLOOD RIGHT ARM Performed at [facility] [address] [place], [place] [address]    Special Requests   Final    BOTTLES DRAWN AEROBIC ONLY Blood Culture results may not be optimal due to an inadequate volume of blood received in culture bottles Performed at [facility] [address] [place], [place] [address]    Culture   Final    NO GROWTH 1 DAY Performed at [facility],  [place] [address], [place], [place] [address]  Report Status PENDING  Incomplete      Studies: No results found.  Scheduled Meds: . feeding supplement (GLUCERNA SHAKE)  237 mL Oral TID BM  . fentaNYL  1 patch Transdermal Q72H  . guaiFENesin  1,200 mg Oral BID  . insulin aspart  0-9 Units Subcutaneous TID WC  . insulin glargine  5 Units Subcutaneous QHS  . ipratropium-albuterol  3 mL Nebulization TID  . losartan  25 mg Oral Daily  . mouth rinse  15 mL Mouth Rinse BID  . Rivaroxaban  15 mg Oral BID WC  . [START ON 02/11/2018] rivaroxaban  20 mg Oral Q supper  . rosuvastatin  40 mg Oral q1800  . sodium chloride HYPERTONIC  4 mL Nebulization TID    Continuous Infusions: . sodium chloride 50 mL/hr at 02/03/18 0613  . ceFEPime (MAXIPIME) IV Stopped (02/02/18 2242)     LOS: 4 days     Kayleen Memos, MD [facility] Pager [phone]  If 7PM-7AM, please contact night-coverage [email or web address] Password [email or web address] 02/03/2018, 10:02 AM

## 2018-02-03 NOTE — Progress Notes (Addendum)
Patient's family asking when Dr. [name] will be coming to see patient today, informed family am not sure but I try to call cancer center to know if they that information, I called and spoke to [facility] nurse who stated she will be calling the family later with update, patient's daughters at the bedside notified that Katharine Look at Dr Grier Mitts office will be calling them, they verbalized understanding.

## 2018-02-03 NOTE — Progress Notes (Signed)
Physical Therapy Treatment Patient Details Name: Elizabeth Calhoun MRN: [id number] DOB: 1945/11/30 Today's Date: 02/03/2018    History of Present Illness Pt admitted with generalized weakness and sepsis.  Pt wtih hx of CVA, Stage IV lung CA with spinal mets, DM, and recent PE    PT Comments    Pt with difficulty with ambulation this session, reporting dyspnea, weakness, and fatigue after 2x15 ft ambulation. Pt also tearful during PT session about health conditions, stating "it is hard to fight all of this". Pt's daughters at bedside during session, and are very encouraging towards PT. PT to continue to follow acutely.    Follow Up Recommendations  Home health PT     Equipment Recommendations  Rolling walker with 5" wheels(Youth level RW please - pt is 5'0")    Recommendations for Other Services OT consult     Precautions / Restrictions Precautions Precautions: Fall Precaution Comments: 3LO2 via [place] Restrictions Weight Bearing Restrictions: No    Mobility  Bed Mobility Overal bed mobility: Needs Assistance Bed Mobility: Supine to Sit;Sit to Supine     Supine to sit: Min assist;HOB elevated Sit to supine: Min assist;HOB elevated   General bed mobility comments: Min assist for steadying at EOB, increased time to come to sitting. Min assist for sit to supine for LE management. PT and RN assisted pt with scooting up in bed with use of bed pad after session.   Transfers Overall transfer level: Needs assistance Equipment used: Rolling walker (2 wheeled) Transfers: Sit to/from Stand Sit to Stand: Min assist         General transfer comment: Min assist for steadying upon standing, verbal cuing for hand placement. Upon standing, pt reporting dyspnea, sats 92% on 3L. Sit to stand x2, once from bed and once from chair in hallway when pt required rest break.   Ambulation/Gait Ambulation/Gait assistance: Min assist;+2 safety/equipment(pt requiring chair assist ) Gait Distance  (Feet): 30 Feet(2x15 ft ) Assistive device: Rolling walker (2 wheeled) Gait Pattern/deviations: Step-through pattern;Shuffle;Trunk flexed;Decreased stride length Gait velocity: decr   General Gait Details: Min assist for steadying, directing pt with verbal and tactile cuing. Pt required max encouragement from PT, RN, and 2 daughters for mobility. Pt stopping multiple times without warning and leaning forward. When asked if pt was okay, she said she was. Pt suspected limited by dyspnea and emotions, as pt was tearful at times during ambulation. After 15 ft ambulation, pt leaning on forearms and needed to sit, RN brought chair and pt took 2 minute seated rest break. Sats during ambulation 84-92% on 3LO2. Pt instructed in "breathe in through your nose, out through your mouth" during session to recover sats.    Stairs             Wheelchair Mobility    Modified Rankin (Stroke Patients Only)       Balance Overall balance assessment: Needs assistance Sitting-balance support: No upper extremity supported;Feet supported Sitting balance-Leahy Scale: Good       Standing balance-Leahy Scale: Poor Standing balance comment: relies on RW and PT for support                             Cognition Arousal/Alertness: Awake/alert Behavior During Therapy: WFL for tasks assessed/performed Overall Cognitive Status: Impaired/Different from baseline Area of Impairment: Memory;Following commands;Safety/judgement;Problem solving                     Memory: Decreased  short-term memory Following Commands: Follows one step commands with increased time;Follows one step commands inconsistently     Problem Solving: Decreased initiation;Difficulty sequencing;Requires verbal cues;Requires tactile cues;Slow processing General Comments: Pt requiring multimodal cues, and sometimes required multiple cues prior to initiating mobility. Pt sitting EOB for ~30 seconds after ambulation, and  states "oh, I am on the bed?". Pt endorses having memory difficulty since being in the hospital.       Exercises      General Comments General comments (skin integrity, edema, etc.): HR 100-118 bpm during ambulation, sats 84-92% on 3LO2 during ambulation. Pt placed on 4LO2 after ambulation (RN notified) due to sats at 84% and difficult to recover.       Pertinent Vitals/Pain Pain Assessment: No/denies pain    Home Living                      Prior Function            PT Goals (current goals can now be found in the care plan section) Acute Rehab PT Goals Patient Stated Goal: Regain IND Time For Goal Achievement: 02/15/18 Potential to Achieve Goals: Fair Progress towards PT goals: Progressing toward goals    Frequency    Min 3X/week      PT Plan Current plan remains appropriate    Co-evaluation              AM-PAC PT "6 Clicks" Mobility   Outcome Measure  Help needed turning from your back to your side while in a flat bed without using bedrails?: A Little Help needed moving from lying on your back to sitting on the side of a flat bed without using bedrails?: A Little Help needed moving to and from a bed to a chair (including a wheelchair)?: A Little Help needed standing up from a chair using your arms (e.g., wheelchair or bedside chair)?: A Little Help needed to walk in hospital room?: A Little Help needed climbing 3-5 steps with a railing? : A Lot 6 Click Score: 17    End of Session Equipment Utilized During Treatment: Gait belt;Oxygen Activity Tolerance: Patient limited by fatigue;Other (comment)(Pt limited by sats ) Patient left: with call bell/phone within reach;with family/visitor present;in bed;with bed alarm set;with SCD's reapplied;with nursing/sitter in room Nurse Communication: Mobility status PT Visit Diagnosis: Unsteadiness on feet (R26.81);Muscle weakness (generalized) (M62.81);Difficulty in walking, not elsewhere classified (R26.2)      Time: 9417-4081 PT Time Calculation (min) (ACUTE ONLY): 30 min  Charges:  $Gait Training: 23-37 mins                     Elizabeth Calhoun, PT Acute Rehabilitation Services Pager [phone]  Office [phone]    Elizabeth Calhoun 02/03/2018, 2:45 PM

## 2018-02-04 LAB — BASIC METABOLIC PANEL
Anion gap: 9 (ref 5–15)
BUN: 12 mg/dL (ref 8–23)
CO2: 19 mmol/L — ABNORMAL LOW (ref 22–32)
Calcium: 7.9 mg/dL — ABNORMAL LOW (ref 8.9–10.3)
Chloride: 107 mmol/L (ref 98–111)
Creatinine, Ser: 0.86 mg/dL (ref 0.44–1.00)
GFR calc Af Amer: >60 mL/min (ref >60)
GFR calc non Af Amer: >60 mL/min (ref >60)
GLUCOSE: 154 mg/dL — AB (ref 70–99)
Potassium: 3.9 mmol/L (ref 3.5–5.1)
Sodium: 135 mmol/L (ref 135–145)

## 2018-02-04 LAB — CBC
HEMATOCRIT: 25.2 % — AB (ref 36.0–46.0)
Hemoglobin: 7.7 g/dL — ABNORMAL LOW (ref 12.0–15.0)
MCH: 28.8 pg (ref 26.0–34.0)
MCHC: 30.6 g/dL (ref 30.0–36.0)
MCV: 94.4 fL (ref 80.0–100.0)
Platelets: 301 10*3/uL (ref 150–400)
RBC: 2.67 MIL/uL — ABNORMAL LOW (ref 3.87–5.11)
RDW: 15.7 % — ABNORMAL HIGH (ref 11.5–15.5)
WBC: 5.7 10*3/uL (ref 4.0–10.5)
nRBC: 0 % (ref 0.0–0.2)

## 2018-02-04 LAB — CULTURE, BLOOD (ROUTINE X 2)
Culture: NO GROWTH
Culture: NO GROWTH
Special Requests: ADEQUATE

## 2018-02-04 LAB — HEMOGLOBIN AND HEMATOCRIT, BLOOD
HCT: 25.3 % — ABNORMAL LOW (ref 36.0–46.0)
Hemoglobin: 7.8 g/dL — ABNORMAL LOW (ref 12.0–15.0)

## 2018-02-04 LAB — GLUCOSE, CAPILLARY
GLUCOSE-CAPILLARY: 132 mg/dL — AB (ref 70–99)
Glucose-Capillary: 121 mg/dL — ABNORMAL HIGH (ref 70–99)
Glucose-Capillary: 149 mg/dL — ABNORMAL HIGH (ref 70–99)

## 2018-02-04 NOTE — Progress Notes (Signed)
    [place] for Infectious Disease    Date of Admission:  01/29/2018   Total days of antibiotics 7/day 2 of piptazo           ID: Elizabeth Calhoun is a 73 y.o. female with  Principal Problem:   Sepsis ([place]) Active Problems:   Bone metastasis ([place])   Pulmonary embolism ([place])   Lung cancer ([place])   HTN (hypertension)   HLD (hyperlipidemia)   Hyponatremia   Stroke ([place])   Normocytic anemia   Diabetes mellitus without complication ([place])   Acute metabolic encephalopathy   Palliative care by specialist   Goals of care, counseling/discussion    Subjective: Afebrile this morning but fevers yesterday. She states that she feels better  Medications:  . feeding supplement (GLUCERNA SHAKE)  237 mL Oral TID BM  . fentaNYL  1 patch Transdermal Q72H  . guaiFENesin  1,200 mg Oral BID  . insulin aspart  0-9 Units Subcutaneous TID WC  . insulin glargine  5 Units Subcutaneous QHS  . ipratropium-albuterol  3 mL Nebulization TID  . losartan  25 mg Oral Daily  . mouth rinse  15 mL Mouth Rinse BID  . Rivaroxaban  15 mg Oral BID WC  . [START ON 02/11/2018] rivaroxaban  20 mg Oral Q supper  . rosuvastatin  40 mg Oral q1800    Objective: Vital signs in last 24 hours: Temp:  [98.4 F (36.9 C)-102.7 F (39.3 C)] 98.8 F (37.1 C) (01/28 1342) Pulse Rate:  [92-96] 92 (01/28 1342) Resp:  [18-26] 18 (01/28 0340) BP: (116-147)/(71-78) 116/75 (01/28 1342) SpO2:  [97 %-100 %] 100 % (01/28 1420) Physical Exam  Constitutional:  oriented to person, place, and time. appears well-developed and well-nourished. No distress.  HENT: [place]/AT, PERRLA, no scleral icterus Mouth/Throat: Oropharynx is clear and moist. No oropharyngeal exudate.  Cardiovascular: Normal rate, regular rhythm and normal heart sounds. Exam reveals no gallop and no friction rub.  No murmur heard.  Pulmonary/Chest: Effort normal and breath sounds [place].no expiratory wheezing Neck = supple, no nuchal rigidity Abdominal: Soft.  Bowel sounds are normal.  exhibits no distension. There is no tenderness.  Lymphadenopathy: no cervical adenopathy. No axillary adenopathy Neurological: alert and oriented to person, place, and time.  Skin: Skin is warm and dry. No rash noted. No erythema.  Psychiatric: a normal mood and affect.  behavior is normal.    Lab Results Recent Labs    02/03/18 0800 02/04/18 0431 02/04/18 0707  WBC 8.5 5.7  --   HGB 10.1* 7.7* 7.8*  HCT 31.2* 25.2* 25.3*  NA 137 135  --   K 4.5 3.9  --   CL 107 107  --   CO2 21* 19*  --   BUN 10 12  --   CREATININE 0.87 0.86  --     Microbiology: 1/24 blood cx ngtd Studies/Results: No results found.   Assessment/Plan: Post obstructive pneumonia vs +/- with pneumonitis = continue on piptazo. If she is afebrile tomorrow then would tx for 7 days. If continued fever would consider evaluating for pneumonitis  [facility] for Infectious Diseases Cell: [phone] Pager: [phone]  02/04/2018, 4:53 PM

## 2018-02-04 NOTE — Progress Notes (Signed)
PROGRESS NOTE  Elizabeth Calhoun UVO:[id number] DOB: 24-Jun-1945 DOA: 01/29/2018 PCP: Seward Carol, MD  HPI/Recap of past 24 hours: Elizabeth Calhoun is a 73 y.o. female with medical history significant of hypertension, hyperlipidemia, type II diabetes mellitus, previous CVA, recently diagnosed PE on Xarelto, metastatic lung cancer (s/p first round of palliative radiation therapy), who presents with generalized weakness, poor appetite, fever and chills, mild confusion.  Admitted for sepsis of unclear etiology.  Treated with IV antibiotics empirically.  Hospital course complicated by persistent recurrent fevers despite empiric treatment for HCAP.  ID consulted and following.  Independently reviewed her CT chest which revealed known lung mass left apical. and bilateral nodular densities.  Lactic acid, procalcitonin, WBC, blood cultures, unremarkable.  Influenza a and B negative.  Respiratory viral panel negative.  Possible radiation pneumonitis? discussed with radiation oncology Dr. [name] and Dr [name] who are not convinced of this.  02/04/2018: Patient seen and examined with her daughter Elizabeth Calhoun present at bedside.  Continues to have fevers with T-max of 102.  She has no new complaints and states she feels fine.  ID following.   Assessment/Plan: Principal Problem:   Sepsis ([place]) Active Problems:   Bone metastasis ([place])   Pulmonary embolism (H[facility])   Lung cancer (H[facility])   HTN (hypertension)   HLD (hyperlipidemia)   Hyponatremia   Stroke ([place])   Normocytic anemia   Diabetes mellitus without complication ([place])   Acute metabolic encephalopathy   Palliative care by specialist   Goals of care, counseling/discussion  Sepsis ([place]) of unclear etiology possibly secondary to postobstructive pneumonia:  Presented with fever, tachycardia, tachypnea, hypotension.   Blood pressure responded to IV fluid and is stabilized now.   Procalcitonin and lactic acid unremarkable Urine analysis  unremarkable Independently reviewed chest x-ray and CT chest which revealed bilateral infiltrates and diffuse groundglass airspace density and nodularity right greater than left She has ongoing recurrent fevers with no leukocytosis Completed 5 days of cefepime empirically  Switched to Zosyn on 02/03/2018 DC IV vancomycin due to MRSA screening PCR negative Does not appear septic despite recurrent fevers  Recurrent fevers Possibly multifactorial secondary to suspected postobstructive pneumonia versus from malignancy Overnight fever with T-max of 102 Blood cultures negative to date No leukocytosis On Zosyn empirically  Hypertension Blood pressure is now stable Blood pressure stable Continue losartan on 02/02/2018 Continue to hold HCTZ due to recent hyponatremia  Recent hyponatremia Continue to hold HCTZ Restarted losartan on 02/02/2018 due to mildly elevated blood pressure Reviewed pressures and labs, they are stable  Recent hx of Plmonary embolism ([place]): -on Xarelto  Left lung cancer with bone metastasis: per previous discharge summary, "She does have a large left upper lobe nodule, most consistent with bronchogenic carcinoma,left suprahilar nodal mass which was obstructing the left upper lobe bronchus.Positive distant skeletal metastasis involving the proximal femur, sacrum and multiple lesions in thoracic lumbar spine, ribs and scapula. She has received palliative radiation therapy,and a cervical lymph node biopsy positive for adenocarcinoma". Pt is followed by Dr. [name]. -Discussed with Dr. [name] on 01/30/18 Repeat chest x-ray done on 02/01/2018 revealed new rounded opacity within the left perihilar line measuring 4 cm in greatest dimension compatible with hilar mass/metastatic lymph node described on recent chest CT report.  HLD (hyperlipidemia): -crestor   Resolved Hyponatremia: Continue to hold HCTZ  Hx of Stroke [facility]): -on Crestor and xarelto  Normocytic anemia:  Hemoglobin dropped from 10.1 to 7.7 this morning.  Repeat H&H.   Diabetes mellitus with hyperglycemia ([place]):  Last  A1c 9.0. C/w lantus and ISS  Acute metabolic encephalopathy: Resolved  Physical debility: PT recommended home health PT and rolling walker with 5 inches wheels.  We will consult OT.  Moderate protein calorie malnutrition Albumin 2.9 on 01/29/2018 BMI 28 Continue oral supplement Glucerna 3 times daily  History of CVA and 5 mm lesion in the brain Independently reviewed MRI done on 12/26/2017 which revealed single 5 mm focus of enhancement within the right anterior basal ganglia likely metastasis Progression of advanced chronic microvascular ischemic changes and stable moderate volume loss of the brain, multiple stable chronic infarcts as above  Chronic constipation Continue bowel regimen with Senokot 2 tablets twice daily, MiraLAX daily, Dulcolax suppository as needed  DVT ppx: on Xarelto Code Status: Full code Family Communication:   Patient's daughter at bedside.  All questions answered to her satisfaction.  Disposition Plan:   Discharge possibly in 1 to 2 days to home with home health services Consults called:   Discussed with radiation oncology Dr. [name] and Dr. [name] on 01/30/2018.  Also discussed with Dr. [name] on 02/04/2018 who is not convinced that the recurrent fevers are due to radiation pneumonitis.     Objective: Vitals:   02/04/18 0730 02/04/18 1036 02/04/18 1342 02/04/18 1420  BP:   116/75   Pulse:   92   Resp:      Temp:  98.6 F (37 C) 98.8 F (37.1 C)   TempSrc:  Rectal Oral   SpO2: 100%  100% 100%  Weight:      Height:        Intake/Output Summary (Last 24 hours) at 02/04/2018 1508 Last data filed at 02/04/2018 0800 Gross per 24 hour  Intake 1949.01 ml  Output 700 ml  Net 1249.01 ml   Filed Weights   01/30/18 1643  Weight: 65.4 kg    Exam:  . General: 73 y.o. year-old female well-developed well-nourished no acute distress.  Alert  and oriented x3.   . Cardiovascular: Regular rate and rhythm with no rubs or gallops.  No JVD or thyromegaly noted.Marland Kitchen  Respiratory: Diffused mild rales with mild expiratory wheezes.  Good inspiratory effort. . Abdomen: Soft nontender nondistended with normal bowel sounds x4 quadrants. . Musculoskeletal: No edema. 2/4 pulses in all 4 extremities. Marland Kitchen Psychiatry: Mood is appropriate for condition and setting   Data Reviewed: CBC: Recent Labs  Lab 01/29/18 2150  01/31/18 1718 02/01/18 0421 02/02/18 0415 02/03/18 0800 02/04/18 0431 02/04/18 0707  WBC 8.0   < > 8.6 6.8 7.4 8.5 5.7  --   NEUTROABS 5.7  --  6.0  --   --   --   --   --   HGB 9.2*   < > 7.0* 8.0* 8.2* 10.1* 7.7* 7.8*  HCT 28.6*   < > 23.2* 26.0* 26.1* 31.2* 25.2* 25.3*  MCV 91.7   < > 96.7 94.2 93.5 89.9 94.4  --   PLT 288   < > 285 255 255 305 301  --    < > = values in this interval not displayed.   Basic Metabolic Panel: Recent Labs  Lab 01/31/18 0345 02/01/18 0421 02/02/18 0415 02/03/18 0800 02/04/18 0431  NA 134* 133* 135 137 135  K 3.8 3.3* 3.8 4.5 3.9  CL 106 104 105 107 107  CO2 18* 21* 21* 21* 19*  GLUCOSE 156* 113* 117* 112* 154*  BUN 12 10 11 10 12   CREATININE 0.86 0.77 0.70 0.87 0.86  CALCIUM 7.5* 7.5* 7.5* 8.2*  7.9*   GFR: Estimated Creatinine Clearance: 49.2 mL/min (by C-G formula based on SCr of 0.86 mg/dL). Liver Function Tests: Recent Labs  Lab 01/29/18 2150  AST 28  ALT 19  ALKPHOS 119  BILITOT 0.8  PROT 6.4*  ALBUMIN 2.9*   No results for input(s): LIPASE, AMYLASE in the last 168 hours. No results for input(s): AMMONIA in the last 168 hours. Coagulation Profile: Recent Labs  Lab 01/29/18 2150 01/30/18 0545  INR 4.93* 3.81   Cardiac Enzymes: No results for input(s): CKTOTAL, CKMB, CKMBINDEX, TROPONINI in the last 168 hours. BNP (last 3 results) No results for input(s): PROBNP in the last 8760 hours. HbA1C: No results for input(s): HGBA1C in the last 72 hours. CBG: Recent  Labs  Lab 02/03/18 0809 02/03/18 1218 02/03/18 1719 02/03/18 2141 02/04/18 0743  GLUCAP 119* 155* 108* 159* 121*   Lipid Profile: No results for input(s): CHOL, HDL, LDLCALC, TRIG, CHOLHDL, LDLDIRECT in the last 72 hours. Thyroid Function Tests: No results for input(s): TSH, T4TOTAL, FREET4, T3FREE, THYROIDAB in the last 72 hours. Anemia Panel: No results for input(s): VITAMINB12, FOLATE, FERRITIN, TIBC, IRON, RETICCTPCT in the last 72 hours. Urine analysis:    Component Value Date/Time   COLORURINE YELLOW 01/30/2018 0441   APPEARANCEUR HAZY (A) 01/30/2018 0441   LABSPEC 1.020 01/30/2018 0441   PHURINE 5.0 01/30/2018 0441   GLUCOSEU NEGATIVE 01/30/2018 0441   HGBUR SMALL (A) 01/30/2018 0441   BILIRUBINUR NEGATIVE 01/30/2018 0441   KETONESUR NEGATIVE 01/30/2018 0441   PROTEINUR NEGATIVE 01/30/2018 0441   UROBILINOGEN 0.2 01/10/2010 1749   NITRITE NEGATIVE 01/30/2018 0441   LEUKOCYTESUR NEGATIVE 01/30/2018 0441   Sepsis Labs: @LABRCNTIP (procalcitonin:4,lacticidven:4)  ) Recent Results (from the past 240 hour(s))  Culture, blood (Routine x 2)     Status: None   Collection Time: 01/29/18  9:50 PM  Result Value Ref Range Status   Specimen Description   Final    BLOOD RIGHT FOREARM Performed at [facility], [place] [address] [place], [place] [address]    Special Requests   Final    BOTTLES DRAWN AEROBIC AND ANAEROBIC Blood Culture adequate volume Performed at [place] [address] [place], [place] [address]    Culture   Final    NO GROWTH 5 DAYS Performed at [facility], [place] [address], [place], [place] [address]    Report Status 02/04/2018 FINAL  Final  Culture, blood (Routine x 2)     Status: None   Collection Time: 01/29/18  9:55 PM  Result Value Ref Range Status   Specimen Description   Final    BLOOD LEFT ANTECUBITAL Performed at [place] [address], [place], [place] [address]    Special  Requests   Final    BOTTLES DRAWN AEROBIC AND ANAEROBIC Blood Culture results may not be optimal due to an inadequate volume of blood received in culture bottles Performed at [place] [address], [place], [place] [address]    Culture   Final    NO GROWTH 5 DAYS Performed at [facility], [place] [address] [place], [place] [address]    Report Status 02/04/2018 FINAL  Final  Urine Culture     Status: Abnormal   Collection Time: 01/30/18  4:41 AM  Result Value Ref Range Status   Specimen Description   Final    URINE, CLEAN CATCH Performed at [facility], [place] [address] [place],  [place] [address]    Special Requests   Final    NONE Performed at [facility], [place] [address] [place], [place] [address]    Culture MULTIPLE SPECIES PRESENT, SUGGEST RECOLLECTION (A)  Final   Report Status 01/31/2018 FINAL  Final  MRSA PCR Screening     Status: None   Collection Time: 01/30/18  4:55 AM  Result Value Ref Range Status   MRSA by PCR NEGATIVE NEGATIVE Final    Comment:        The GeneXpert MRSA Assay (FDA approved for NASAL specimens only), is one component of a comprehensive MRSA colonization surveillance program. It is not intended to diagnose MRSA infection nor to guide or monitor treatment for MRSA infections. Performed at [facility], [place] [address], [place], [place] [address]   Respiratory Panel by PCR     Status: None   Collection Time: 01/30/18 11:19 AM  Result Value Ref Range Status   Adenovirus NOT DETECTED NOT DETECTED Final   Coronavirus 229E NOT DETECTED NOT DETECTED Final   Coronavirus HKU1 NOT DETECTED NOT DETECTED Final   Coronavirus NL63 NOT DETECTED NOT DETECTED Final   Coronavirus OC43 NOT DETECTED NOT DETECTED Final   Metapneumovirus NOT DETECTED NOT DETECTED Final   Rhinovirus / Enterovirus NOT DETECTED NOT DETECTED Final   Influenza A NOT DETECTED NOT DETECTED Final    Influenza B NOT DETECTED NOT DETECTED Final   Parainfluenza Virus 1 NOT DETECTED NOT DETECTED Final   Parainfluenza Virus 2 NOT DETECTED NOT DETECTED Final   Parainfluenza Virus 3 NOT DETECTED NOT DETECTED Final   Parainfluenza Virus 4 NOT DETECTED NOT DETECTED Final   Respiratory Syncytial Virus NOT DETECTED NOT DETECTED Final   Bordetella pertussis NOT DETECTED NOT DETECTED Final   Chlamydophila pneumoniae NOT DETECTED NOT DETECTED Final   Mycoplasma pneumoniae NOT DETECTED NOT DETECTED Final    Comment: Performed at [facility], [place] [address], [place], [place] [address]  Culture, blood (routine x 2)     Status: None (Preliminary result)   Collection Time: 01/31/18  5:18 PM  Result Value Ref Range Status   Specimen Description   Final    BLOOD LEFT ARM Performed at [place] [address], [place], [place] [address]    Special Requests   Final    BOTTLES DRAWN AEROBIC ONLY Blood Culture adequate volume Performed at [place] [address] [place], [place] [address]    Culture   Final    NO GROWTH 3 DAYS Performed at [facility], [place] [address] [place], [place] [address]    Report Status PENDING  Incomplete  Culture, blood (routine x 2)     Status: None (Preliminary result)   Collection Time: 01/31/18  5:18 PM  Result Value Ref Range Status   Specimen Description   Final    BLOOD RIGHT ARM Performed at [place] [address] [place], [place] [address]    Special Requests   Final    BOTTLES DRAWN AEROBIC ONLY Blood Culture results may not be optimal due to an inadequate volume of blood received in culture bottles Performed at [place] [address] [place], [place] [address]    Culture   Final    NO GROWTH 3 DAYS Performed at [facility], [place] [address], [place], [place] [address]    Report Status PENDING  Incomplete  Studies: No results  found.  Scheduled Meds: . feeding supplement (GLUCERNA SHAKE)  237 mL Oral TID BM  . fentaNYL  1 patch Transdermal Q72H  . guaiFENesin  1,200 mg Oral BID  . insulin aspart  0-9 Units Subcutaneous TID WC  . insulin glargine  5 Units Subcutaneous QHS  . ipratropium-albuterol  3 mL Nebulization TID  . losartan  25 mg Oral Daily  . mouth rinse  15 mL Mouth Rinse BID  . Rivaroxaban  15 mg Oral BID WC  . [START ON 02/11/2018] rivaroxaban  20 mg Oral Q supper  . rosuvastatin  40 mg Oral q1800    Continuous Infusions: . sodium chloride 50 mL/hr at 02/04/18 0600  . piperacillin-tazobactam (ZOSYN)  IV 3.375 g (02/04/18 1438)     LOS: 5 days     Kayleen Memos, MD [facility] Pager [phone]  If 7PM-7AM, please contact night-coverage [email or web address] Password [facility] 02/04/2018, 3:08 PM

## 2018-02-05 ENCOUNTER — Inpatient Hospital Stay (HOSPITAL_COMMUNITY): Payer: Medicare Other

## 2018-02-05 DIAGNOSIS — C7951 Secondary malignant neoplasm of bone: Secondary | ICD-10-CM

## 2018-02-05 DIAGNOSIS — C3410 Malignant neoplasm of upper lobe, unspecified bronchus or lung: Secondary | ICD-10-CM

## 2018-02-05 DIAGNOSIS — R5081 Fever presenting with conditions classified elsewhere: Secondary | ICD-10-CM

## 2018-02-05 LAB — GLUCOSE, CAPILLARY
Glucose-Capillary: 84 mg/dL (ref 70–99)
Glucose-Capillary: 77 mg/dL (ref 70–99)
Glucose-Capillary: 94 mg/dL (ref 70–99)
Glucose-Capillary: 149 mg/dL — ABNORMAL HIGH (ref 70–99)
Glucose-Capillary: 165 mg/dL — ABNORMAL HIGH (ref 70–99)

## 2018-02-05 LAB — CBC
HCT: 26.9 % — ABNORMAL LOW (ref 36.0–46.0)
Hemoglobin: 8.1 g/dL — ABNORMAL LOW (ref 12.0–15.0)
MCH: 28.8 pg (ref 26.0–34.0)
MCHC: 30.1 g/dL (ref 30.0–36.0)
MCV: 95.7 fL (ref 80.0–100.0)
Platelets: 340 10*3/uL (ref 150–400)
RBC: 2.81 MIL/uL — ABNORMAL LOW (ref 3.87–5.11)
RDW: 15.7 % — ABNORMAL HIGH (ref 11.5–15.5)
WBC: 6.1 10*3/uL (ref 4.0–10.5)
nRBC: 0 % (ref 0.0–0.2)

## 2018-02-05 LAB — BASIC METABOLIC PANEL
Anion gap: 6 (ref 5–15)
BUN: 10 mg/dL (ref 8–23)
CO2: 21 mmol/L — ABNORMAL LOW (ref 22–32)
Calcium: 7.9 mg/dL — ABNORMAL LOW (ref 8.9–10.3)
Chloride: 112 mmol/L — ABNORMAL HIGH (ref 98–111)
Creatinine, Ser: 0.91 mg/dL (ref 0.44–1.00)
GFR calc Af Amer: >60 mL/min (ref >60)
GFR calc non Af Amer: >60 mL/min (ref >60)
GLUCOSE: 91 mg/dL (ref 70–99)
Potassium: 3.6 mmol/L (ref 3.5–5.1)
Sodium: 139 mmol/L (ref 135–145)

## 2018-02-05 MED ORDER — METHYLPREDNISOLONE SODIUM SUCC 125 MG IJ SOLR
60.0000 mg | Freq: Every day | INTRAMUSCULAR | Status: DC
  Administered 2018-02-05 – 2018-02-06 (×2): 60 mg via INTRAVENOUS
  Filled 2018-02-05 (×2): qty 2

## 2018-02-05 NOTE — Progress Notes (Signed)
OT Cancellation Note  Patient Details Name: Elizabeth Calhoun MRN: [id number] DOB: 1945-09-01   Cancelled Treatment:    Reason Eval/Treat Not Completed: Patient at procedure or test/ unavailable Pt working with RN then headed to xray- will check on pt next day or later this day Kari Baars, [place] [phone] Office- [phone], Edwena Felty D 02/05/2018, 12:06 PM

## 2018-02-05 NOTE — Progress Notes (Signed)
Physical Therapy Treatment Patient Details Name: Elizabeth Calhoun MRN: [id number] DOB: Mar 16, 1945 Today's Date: 02/05/2018    History of Present Illness Pt admitted with generalized weakness and sepsis.  Pt wtih hx of CVA, Stage IV lung CA with spinal mets, DM, and recent PE    PT Comments    Pt with improved ambulation distance today, required 2 standing rest breaks and maintained sats 88-92% during mobility on 3-4L O2. Pt lacked safety awareness with RW use, and had difficulty with command following this session. Pt's daughter present for entirety of session, and is very supportive of mother. PT to continue to follow acutely.   Of note, pt holding food in her cheek upon starting ambulation and spit it out into her hand. Daughter has been assisting her with feeding per PT knowledge.    Follow Up Recommendations  Home health PT     Equipment Recommendations  Rolling walker with 5" wheels(Youth level RW please - pt is 5'0")    Recommendations for Other Services       Precautions / Restrictions Precautions Precautions: Fall Precaution Comments: 3-4L O2 via [place] Restrictions Weight Bearing Restrictions: No    Mobility  Bed Mobility Overal bed mobility: Needs Assistance             General bed mobility comments: Pt up in chair upon arrival to pt room, and pt chose to sit up in chair after ambulation.   Transfers Overall transfer level: Needs assistance Equipment used: Rolling walker (2 wheeled) Transfers: Sit to/from Stand Sit to Stand: Supervision         General transfer comment: Supervision for safety, pt with increased time to rise. PT with verbal cuing for gripping RW, as pt was resting her fist on the handles of the walker.   Ambulation/Gait Ambulation/Gait assistance: Min assist;+2 safety/equipment(chair follow) Gait Distance (Feet): 120 Feet(2 standing rest breaks intersperced with ambulation ) Assistive device: Rolling walker (2 wheeled) Gait  Pattern/deviations: Step-through pattern;Shuffle;Trunk flexed;Decreased stride length;Staggering right Gait velocity: decr   General Gait Details: Min assist for directing pt as pt with tendency to veer right and bump chair rail, steadying. Pt required VC for placement in RW x3 with little application, directing RW. Pt with sats 88-92% during ambulation, starting on 3LO2 with ambulation but required 4L during activity to maintain sats >90%.   Stairs             Wheelchair Mobility    Modified Rankin (Stroke Patients Only)       Balance Overall balance assessment: Needs assistance Sitting-balance support: No upper extremity supported;Feet supported Sitting balance-Leahy Scale: Good       Standing balance-Leahy Scale: Poor Standing balance comment: relies on RW and PT for support                             Cognition Arousal/Alertness: Awake/alert Behavior During Therapy: WFL for tasks assessed/performed; flat affect  Overall Cognitive Status: Impaired/Different from baseline Area of Impairment: Memory;Following commands;Safety/judgement;Problem solving                     Memory: Decreased short-term memory Following Commands: Follows one step commands with increased time;Follows one step commands inconsistently Safety/Judgement: Decreased awareness of safety;Decreased awareness of deficits   Problem Solving: Decreased initiation;Difficulty sequencing;Requires verbal cues;Requires tactile cues;Slow processing General Comments: Pt responding to approximately 50% PT questions the first time they are asked. Pt with slow processing, and lacks safety awareness  with ambulation as pt requires frequent verbal cues for directing RW away from walls and objects in the hallway.      Exercises      General Comments        Pertinent Vitals/Pain Pain Assessment: No/denies pain    Home Living                      Prior Function            PT  Goals (current goals can now be found in the care plan section) Acute Rehab PT Goals Patient Stated Goal: Regain IND Time For Goal Achievement: 02/15/18 Potential to Achieve Goals: Fair Progress towards PT goals: Progressing toward goals    Frequency    Min 3X/week      PT Plan Current plan remains appropriate    Co-evaluation              AM-PAC PT "6 Clicks" Mobility   Outcome Measure  Help needed turning from your back to your side while in a flat bed without using bedrails?: A Little Help needed moving from lying on your back to sitting on the side of a flat bed without using bedrails?: A Little Help needed moving to and from a bed to a chair (including a wheelchair)?: A Little Help needed standing up from a chair using your arms (e.g., wheelchair or bedside chair)?: A Little Help needed to walk in hospital room?: A Little Help needed climbing 3-5 steps with a railing? : A Lot 6 Click Score: 17    End of Session Equipment Utilized During Treatment: Gait belt;Oxygen Activity Tolerance: Patient limited by fatigue Patient left: with call bell/phone within reach;with family/visitor present;in chair Nurse Communication: Mobility status PT Visit Diagnosis: Unsteadiness on feet (R26.81);Muscle weakness (generalized) (M62.81);Difficulty in walking, not elsewhere classified (R26.2)     Time: 1610-9604 PT Time Calculation (min) (ACUTE ONLY): 21 min  Charges:  $Gait Training: 8-22 mins                    Nicola Police, PT Acute Rehabilitation Services Pager [phone]  Office [phone]    Mairim Bade D Despina Hidden 02/05/2018, 2:20 PM

## 2018-02-05 NOTE — Evaluation (Signed)
Occupational Therapy Evaluation Patient Details Name: Elizabeth Calhoun MRN: [id number] DOB: 06/25/1945 Today's Date: 02/05/2018    History of Present Illness Pt admitted with generalized weakness and sepsis.  Pt wtih hx of CVA, Stage IV lung CA with spinal mets, DM, and recent PE   Clinical Impression   This 73 year old female was admitted for the above.  At baseline, she is independent for showering and daughters assist with LB dressing.  Pt needs mostly min A at this time and multimodal cues for tasks as she tends to perseverate. Will follow in acute setting to further assess for bathroom DME and bathroom transfers.     Follow Up Recommendations  Supervision/Assistance - 24 hour;Home health OT    Equipment Recommendations  (to be further assessed  ?3:1)    Recommendations for Other Services       Precautions / Restrictions Precautions Precautions: Fall Precaution Comments: 3-4L O2 via [place] Restrictions Weight Bearing Restrictions: No      Mobility Bed Mobility Overal bed mobility: Needs Assistance             General bed mobility comments: oob  Transfers Overall transfer level: Needs assistance Equipment used: Rolling walker (2 wheeled) Transfers: Sit to/from Stand Sit to Stand: Min guard         General transfer comment: cues for UE placement    Balance Overall balance assessment: Needs assistance Sitting-balance support: No upper extremity supported;Feet supported Sitting balance-Leahy Scale: Good       Standing balance-Leahy Scale: Poor Standing balance comment: relies on RW                           ADL either performed or assessed with clinical judgement   ADL Overall ADL's : Needs assistance/impaired   Eating/Feeding Details (indicate cue type and reason): doesn't have an appetite Grooming: Oral care;Minimal assistance;Standing Grooming Details (indicate cue type and reason): assist to move along with task: perseverated on multiple  steps.  Applied too much toothpaste, kept rinsing cup, kept rinsing mouth once spitting clear water; continuously wiped up after herself Upper Body Bathing: Minimal assistance   Lower Body Bathing: Minimal assistance   Upper Body Dressing : Minimal assistance   Lower Body Dressing: Moderate assistance;Maximal assistance   Toilet Transfer: Minimal assistance;Stand-pivot;RW(from sink to chair)             General ADL Comments: daughters present and state that pt will have 24/7 at home.  They realize she will need some kind of seat in tub for safety     Vision         Perception     Praxis      Pertinent Vitals/Pain Pain Assessment: No/denies pain     Hand Dominance     Extremity/Trunk Assessment Upper Extremity Assessment Upper Extremity Assessment: Generalized weakness           Communication Communication Communication: No difficulties   Cognition Arousal/Alertness: Awake/alert Behavior During Therapy: WFL for tasks assessed/performed Overall Cognitive Status: Impaired/Different from baseline Area of Impairment: Memory;Following commands;Safety/judgement;Problem solving                     Memory: Decreased short-term memory Following Commands: Follows one step commands with increased time;Follows one step commands inconsistently Safety/Judgement: Decreased awareness of safety;Decreased awareness of deficits   Problem Solving: Requires verbal cues;Requires tactile cues General Comments: perseverates on tasks   General Comments  positioned LUE with palm up  and wrist flexed after she scooted to edge of chair    Exercises     Shoulder Instructions      Home Living Family/patient expects to be discharged to:: Private residence Living Arrangements: Children;Other relatives Available Help at Discharge: Family               Bathroom Shower/Tub: Tub/shower unit   Bathroom Toilet: Handicapped height     Home Equipment: None           Prior Functioning/Environment Level of Independence: Needs assistance        Comments: family would assist for LB adls as needed. Pt stood in shower and took bathed by herself        OT Problem List: Decreased strength;Decreased activity tolerance;Impaired balance (sitting and/or standing);Decreased cognition;Decreased safety awareness      OT Treatment/Interventions: Self-care/ADL training;DME and/or AE instruction;Therapeutic activities;Cognitive remediation/compensation;Patient/family education;Balance training    OT Goals(Current goals can be found in the care plan section) Acute Rehab OT Goals Patient Stated Goal: Regain IND OT Goal Formulation: With patient/family Time For Goal Achievement: 02/19/18 Potential to Achieve Goals: Good ADL Goals Pt Will Transfer to Toilet: with supervision;ambulating;bedside commode;regular height toilet(VS, INCLUDING HYGIENE) Pt Will Perform Tub/Shower Transfer: Tub transfer;with min guard assist;ambulating;shower seat;tub bench(VS)  OT Frequency: Min 2X/week   Barriers to D/C:            Co-evaluation              AM-PAC OT "6 Clicks" Daily Activity     Outcome Measure Help from another person eating meals?: A Little Help from another person taking care of personal grooming?: A Little Help from another person toileting, which includes using toliet, bedpan, or urinal?: A Little Help from another person bathing (including washing, rinsing, drying)?: A Little Help from another person to put on and taking off regular upper body clothing?: A Little Help from another person to put on and taking off regular lower body clothing?: A Lot 6 Click Score: 17   End of Session    Activity Tolerance: Patient tolerated treatment well Patient left: in chair;with call bell/phone within reach;with family/visitor present  OT Visit Diagnosis: Muscle weakness (generalized) (M62.81)                Time: 5638-9373 OT Time Calculation (min): 28  min Charges:  OT General Charges $OT Visit: 1 Visit OT Evaluation $OT Eval Low Complexity: 1 Low OT Treatments $Self Care/Home Management : 8-22 mins  Lesle Chris, OTR/L Acute Rehabilitation Services [phone] [facility] pager [phone] office 02/05/2018  [place] 02/05/2018, 2:48 PM

## 2018-02-05 NOTE — Care Management Note (Signed)
Case Management Note  Patient Details  Name: Elizabeth Calhoun MRN: [id number] Date of Birth: 29-Jun-1945  Subjective/Objective:                    Action/Plan:Pt use [facility] and will use them at home. Daughter at bedside.    Expected Discharge Date:  (unknown)               Expected Discharge Plan:  [place]  In-House Referral:     Discharge planning Services     Post Acute Care Choice:  Durable Medical Equipment Choice offered to:  Adult Children  DME Arranged:  Walker rolling with seat DME Agency:  [place] Arranged:  RN, PT, Nurse's Aide [place] Agency:  [place]  Status of Service:  Completed, signed off  If discussed at [place] of Stay Meetings, dates discussed:    Additional CommentsPurcell Mouton, RN 02/05/2018, 10:47 AM

## 2018-02-05 NOTE — Progress Notes (Signed)
PROGRESS NOTE    Elizabeth Calhoun  FIE:[id number] DOB: 29-Jan-1945 DOA: 01/29/2018 PCP: Seward Carol, MD    Brief Narrative:  Elizabeth Calhoun a 73 y.o.femalewith medical history significant ofhypertension, hyperlipidemia, type II diabetes mellitus, previous CVA, recently diagnosed PE on Xarelto,metastatic lung cancer(s/p first round ofpalliative radiation therapy), whopresents with generalized weakness, poor appetite, fever and chills, mild confusion.  Admitted for sepsis of unclear etiology.  Treated with IV antibiotics empirically.  Hospital course complicated by persistent recurrent fevers despite empiric treatment for HCAP.  ID consulted and following.  Independently reviewed her CT chest which revealed known lung mass left apical. and bilateral nodular densities.  Lactic acid, procalcitonin, WBC, blood cultures, unremarkable.  Influenza a and B negative.  Respiratory viral panel negative.  Possible radiation pneumonitis? discussed with radiation oncology Dr. [name] and Dr [name] who are not convinced of this.  Overnight patient had another temp of 101 and earlier this morning patient had a temperature of 101.  Family is a little frustrated that her fevers have not been improving.  But patient is calm and comfortable and she denies any complaints at this time.  Assessment & Plan:   Principal Problem:   Sepsis ([place]) Active Problems:   Bone metastasis ([place])   Pulmonary embolism ([facility])   Lung cancer ([facility])   HTN (hypertension)   HLD (hyperlipidemia)   Hyponatremia   Stroke ([place])   Normocytic anemia   Diabetes mellitus without complication ([place])   Acute metabolic encephalopathy   Palliative care by specialist   Goals of care, counseling/discussion   Sepsis probably secondary to postobstructive pneumonia Patient on admission had fever, tachycardia was  tachypneic and hypotensive which have all resolved except for the fever. Blood cultures have been negative so far.   Patient has received about 7 days of IV antibiotics.  Currently she is on IV Zosyn 02/03/2018. ID consulted and recommendations given. Since she had recurrent fevers we will get a repeat chest x-ray and get 2 sets of blood cultures. Urine analysis will be ordered.   History of left lung cancer with bone mets. Follows up with Dr. [name].    Hyponatremia Probably secondary to hydrochlorothiazide.  It appears to have resolved.   Hypertension Well-controlled.   History of pulmonary embolism On Xarelto.  History of stroke 5 mm lesion in the brain patient is on Xarelto and Crestor at this time   Moderate protein calorie malnutrition Nutrition consulted continue with Glucerna 3 times daily.   Acute metabolic encephalopathy Resolved Obstipation Continue with bowel regimen with Senokot med MiraLAX daily.  Type 2 diabetes mellitus with hyperglycemia Well-controlled CBG (last 3)  Recent Labs    02/05/18 0800 02/05/18 0838 02/05/18 1158  GLUCAP 84 77 94      Anemia of chronic disease Hemoglobin around 8 this morning.  Transfuse to keep hemoglobin greater than 7. No obvious signs of bleeding.       DVT prophylaxis: Xarelto Code Status: Full code Family Communication: Discussed with daughter at bedside Disposition Plan: Pending resolution of fevers  Consultants:   Infectious disease  Oncology  Procedures: None Antimicrobials: 5 days of IV cefepime followed by 2 days of IV Zosyn, day 7 of IV antibiotics  Subjective: No new complaints except for fevers.  Objective: Vitals:   02/05/18 0029 02/05/18 0526 02/05/18 0545 02/05/18 0835  BP:  132/69    Pulse:  85    Resp:  (!) 26 18   Temp: 99.4 F (37.4 C) 99.9 F (37.7 C)  100.1  F (37.8 C)  TempSrc: Oral Oral  Oral  SpO2:  99%    Weight:      Height:        Intake/Output Summary (Last 24 hours) at 02/05/2018 0947 Last data filed at 02/05/2018 0545 Gross per 24 hour  Intake 1502.15 ml  Output 500 ml    Net 1002.15 ml   Filed Weights   01/30/18 1643  Weight: 65.4 kg    Examination:  General exam: Appears calm and comfortable  Respiratory system: Clear to auscultation. Respiratory effort normal. Cardiovascular system: S1 & S2 heard, RRR. No JVD, murmurs,  Gastrointestinal system: Abdomen is nondistended, soft and nontender.  Central nervous system: Alert and oriented. No focal neurological deficits. Extremities: Symmetric 5 x 5 power. Skin: No rashes, lesions or ulcers Psychiatry: Judgement and insight appear normal.    Data Reviewed: I have personally reviewed following labs and imaging studies  CBC: Recent Labs  Lab 01/29/18 2150  01/31/18 1718 02/01/18 0421 02/02/18 0415 02/03/18 0800 02/04/18 0431 02/04/18 0707 02/05/18 0359  WBC 8.0   < > 8.6 6.8 7.4 8.5 5.7  --  6.1  NEUTROABS 5.7  --  6.0  --   --   --   --   --   --   HGB 9.2*   < > 7.0* 8.0* 8.2* 10.1* 7.7* 7.8* 8.1*  HCT 28.6*   < > 23.2* 26.0* 26.1* 31.2* 25.2* 25.3* 26.9*  MCV 91.7   < > 96.7 94.2 93.5 89.9 94.4  --  95.7  PLT 288   < > 285 255 255 305 301  --  340   < > = values in this interval not displayed.   Basic Metabolic Panel: Recent Labs  Lab 02/01/18 0421 02/02/18 0415 02/03/18 0800 02/04/18 0431 02/05/18 0359  NA 133* 135 137 135 139  K 3.3* 3.8 4.5 3.9 3.6  CL 104 105 107 107 112*  CO2 21* 21* 21* 19* 21*  GLUCOSE 113* 117* 112* 154* 91  BUN 10 11 10 12 10   CREATININE 0.77 0.70 0.87 0.86 0.91  CALCIUM 7.5* 7.5* 8.2* 7.9* 7.9*   GFR: Estimated Creatinine Clearance: 46.5 mL/min (by C-G formula based on SCr of 0.91 mg/dL). Liver Function Tests: Recent Labs  Lab 01/29/18 2150  AST 28  ALT 19  ALKPHOS 119  BILITOT 0.8  PROT 6.4*  ALBUMIN 2.9*   No results for input(s): LIPASE, AMYLASE in the last 168 hours. No results for input(s): AMMONIA in the last 168 hours. Coagulation Profile: Recent Labs  Lab 01/29/18 2150 01/30/18 0545  INR 4.93* 3.81   Cardiac Enzymes: No  results for input(s): CKTOTAL, CKMB, CKMBINDEX, TROPONINI in the last 168 hours. BNP (last 3 results) No results for input(s): PROBNP in the last 8760 hours. HbA1C: No results for input(s): HGBA1C in the last 72 hours. CBG: Recent Labs  Lab 02/04/18 0743 02/04/18 1705 02/04/18 2152 02/05/18 0800 02/05/18 0838  GLUCAP 121* 149* 132* 84 77   Lipid Profile: No results for input(s): CHOL, HDL, LDLCALC, TRIG, CHOLHDL, LDLDIRECT in the last 72 hours. Thyroid Function Tests: No results for input(s): TSH, T4TOTAL, FREET4, T3FREE, THYROIDAB in the last 72 hours. Anemia Panel: No results for input(s): VITAMINB12, FOLATE, FERRITIN, TIBC, IRON, RETICCTPCT in the last 72 hours. Sepsis Labs: Recent Labs  Lab 01/29/18 2350 01/30/18 0545  PROCALCITON  --  0.11  LATICACIDVEN 1.1 1.1    Recent Results (from the past 240 hour(s))  Culture, blood (Routine x  2)     Status: None   Collection Time: 01/29/18  9:50 PM  Result Value Ref Range Status   Specimen Description   Final    BLOOD RIGHT FOREARM Performed at [facility], [place] [address], [place], [place] [address]    Special Requests   Final    BOTTLES DRAWN AEROBIC AND ANAEROBIC Blood Culture adequate volume Performed at [facility] [address] [place], [place] [address]    Culture   Final    NO GROWTH 5 DAYS Performed at [facility], [place] [address], [place], [place] [address]    Report Status 02/04/2018 FINAL  Final  Culture, blood (Routine x 2)     Status: None   Collection Time: 01/29/18  9:55 PM  Result Value Ref Range Status   Specimen Description   Final    BLOOD LEFT ANTECUBITAL Performed at [facility] [address], [place], [place] [address]    Special Requests   Final    BOTTLES DRAWN AEROBIC AND ANAEROBIC Blood Culture results may not be optimal due to an inadequate volume of blood received in culture bottles Performed at [facility] [address], [place], [place] [address]    Culture   Final    NO GROWTH 5 DAYS Performed at [facility], [place] [address] [place], [place] [address]    Report Status 02/04/2018 FINAL  Final  Urine Culture     Status: Abnormal   Collection Time: 01/30/18  4:41 AM  Result Value Ref Range Status   Specimen Description   Final    URINE, CLEAN CATCH Performed at [facility], [place] [address], [place], [place] [address]    Special Requests   Final    NONE Performed at [facility], [place] [address] [place], [place] [address]    Culture MULTIPLE SPECIES PRESENT, SUGGEST RECOLLECTION (A)  Final   Report Status 01/31/2018 FINAL  Final  MRSA PCR Screening     Status: None   Collection Time: 01/30/18  4:55 AM  Result Value Ref Range Status   MRSA by PCR NEGATIVE NEGATIVE Final    Comment:        The GeneXpert MRSA Assay (FDA approved for NASAL specimens only), is one component of a comprehensive MRSA colonization surveillance program. It is not intended to diagnose MRSA infection nor to guide or monitor treatment for MRSA infections. Performed at [facility], [place] [address] [place],  [address]   Respiratory Panel by PCR     Status: None   Collection Time: 01/30/18 11:19 AM  Result Value Ref Range Status   Adenovirus NOT DETECTED NOT DETECTED Final   Coronavirus 229E NOT DETECTED NOT DETECTED Final   Coronavirus HKU1 NOT DETECTED NOT DETECTED Final   Coronavirus NL63 NOT DETECTED NOT DETECTED Final   Coronavirus OC43 NOT DETECTED NOT DETECTED Final   Metapneumovirus NOT DETECTED NOT DETECTED Final   Rhinovirus / Enterovirus NOT DETECTED NOT DETECTED Final   Influenza A NOT DETECTED NOT DETECTED Final   Influenza B NOT DETECTED NOT DETECTED Final   Parainfluenza Virus 1 NOT DETECTED NOT DETECTED Final   Parainfluenza Virus 2 NOT DETECTED NOT DETECTED Final   Parainfluenza Virus 3 NOT DETECTED NOT DETECTED Final     Parainfluenza Virus 4 NOT DETECTED NOT DETECTED Final   Respiratory Syncytial Virus NOT DETECTED NOT DETECTED Final   Bordetella pertussis  NOT DETECTED NOT DETECTED Final   Chlamydophila pneumoniae NOT DETECTED NOT DETECTED Final   Mycoplasma pneumoniae NOT DETECTED NOT DETECTED Final    Comment: Performed at [facility], [place] [address] [place], [place] [address]  Culture, blood (routine x 2)     Status: None (Preliminary result)   Collection Time: 01/31/18  5:18 PM  Result Value Ref Range Status   Specimen Description   Final    BLOOD LEFT ARM Performed at [facility] [address], [place], [place] [address]    Special Requests   Final    BOTTLES DRAWN AEROBIC ONLY Blood Culture adequate volume Performed at [facility] [address], [place], [place] [address]    Culture   Final    NO GROWTH 4 DAYS Performed at [facility], [place] [address], [place], [place] [address]    Report Status PENDING  Incomplete  Culture, blood (routine x 2)     Status: None (Preliminary result)   Collection Time: 01/31/18  5:18 PM  Result Value Ref Range Status   Specimen Description   Final    BLOOD RIGHT ARM Performed at [facility] [address] [place], [place] [address]    Special Requests   Final    BOTTLES DRAWN AEROBIC ONLY Blood Culture results may not be optimal due to an inadequate volume of blood received in culture bottles Performed at [facility] [address], [place], [place] [address]    Culture   Final    NO GROWTH 4 DAYS Performed at [facility], [place] [address] [place], [place] [address]    Report Status PENDING  Incomplete         Radiology Studies: No results found.      Scheduled Meds: . feeding supplement (GLUCERNA SHAKE)  237 mL Oral TID BM  . fentaNYL  1 patch Transdermal Q72H  . guaiFENesin  1,200 mg Oral BID  . insulin aspart  0-9 Units  Subcutaneous TID WC  . insulin glargine  5 Units Subcutaneous QHS  . ipratropium-albuterol  3 mL Nebulization TID  . losartan  25 mg Oral Daily  . mouth rinse  15 mL Mouth Rinse BID  . Rivaroxaban  15 mg Oral BID WC  . [START ON 02/11/2018] rivaroxaban  20 mg Oral Q supper  . rosuvastatin  40 mg Oral q1800   Continuous Infusions: . sodium chloride 50 mL/hr at 02/04/18 2213  . piperacillin-tazobactam (ZOSYN)  IV 3.375 g (02/05/18 0525)     LOS: 6 days    Time spent: 32 minutes    Hosie Poisson, MD [facility] Pager [phone]  If 7PM-7AM, please contact night-coverage [email or web address] Password [facility] 02/05/2018, 9:47 AM

## 2018-02-06 DIAGNOSIS — R509 Fever, unspecified: Secondary | ICD-10-CM

## 2018-02-06 LAB — CULTURE, BLOOD (ROUTINE X 2)
Culture: NO GROWTH
Culture: NO GROWTH
Special Requests: ADEQUATE

## 2018-02-06 LAB — GLUCOSE, CAPILLARY
GLUCOSE-CAPILLARY: 271 mg/dL — AB (ref 70–99)
Glucose-Capillary: 214 mg/dL — ABNORMAL HIGH (ref 70–99)
Glucose-Capillary: 374 mg/dL — ABNORMAL HIGH (ref 70–99)
Glucose-Capillary: 413 mg/dL — ABNORMAL HIGH (ref 70–99)

## 2018-02-06 MED ORDER — FUROSEMIDE 10 MG/ML IJ SOLN
40.0000 mg | Freq: Once | INTRAMUSCULAR | Status: AC
  Administered 2018-02-06: 40 mg via INTRAVENOUS
  Filled 2018-02-06: qty 4

## 2018-02-06 MED ORDER — SODIUM CHLORIDE 0.9 % IV SOLN
INTRAVENOUS | Status: DC | PRN
  Administered 2018-02-06: 250 mL via INTRAVENOUS

## 2018-02-06 NOTE — Progress Notes (Signed)
Physical Therapy Treatment Patient Details Name: Elizabeth Calhoun MRN: [id number] DOB: 11/27/1945 Today's Date: 02/06/2018    History of Present Illness Pt admitted with generalized weakness and sepsis.  Pt wtih hx of CVA, Stage IV lung CA with spinal mets, DM, and recent PE    PT Comments    Pt with improved ambulation distance today, but still lacks safety awareness with mobility. PT reinforced pt safety via tactile cuing and placement in RW. At rest, pt satting 88% on RA. Pt desatted to 79% on RA when standing, and pt required 3LO2 during ambulation to maintain sats >=90%. RN notified of O2 requirements with rest and mobility. Pt's daughters present for session and very supportive. PT to continue to follow acutely.    Follow Up Recommendations  Home health PT     Equipment Recommendations  Rolling walker with 5" wheels(Youth level RW please - pt is 5'0")    Recommendations for Other Services       Precautions / Restrictions Precautions Precautions: Fall Precaution Comments: 2-3L O2 via [place] Restrictions Weight Bearing Restrictions: No    Mobility  Bed Mobility Overal bed mobility: Needs Assistance             General bed mobility comments: Pt up in chair upon PT arrival, and PT left pt up in chair under supervision of family. Pt with SpO2 of 88% on RA at rest.   Transfers Overall transfer level: Needs assistance Equipment used: None Transfers: Sit to/from Stand Sit to Stand: Supervision         General transfer comment: Pt stood prior to PT readiness, pt did not use RW to stand. Once standing, PT insisted pt use RW for steadying. Verbal cuing for hand placement on RW, as pt stood with only one hand on RW.  Upon standing, pt satting 79-85% on RA, pt placed on 1L but unable to recover sats. Pt placed on 2L before initiation of walking.   Ambulation/Gait Ambulation/Gait assistance: Min assist Gait Distance (Feet): 400 Feet Assistive device: Rolling walker (2  wheeled) Gait Pattern/deviations: Step-through pattern;Decreased stride length;Shuffle;Drifts right/left Gait velocity: decr    General Gait Details: Min assist for steadying, directing RW. Verbal cuing for placement in RW multiple times, slow turning with picking up RW and small steps. On 2LO2, pt initially able to maintain sats ~90%, but decreased to 85%. Pt placed on 3LO2 and sats stayed >=90%. RN notified, and RN to formally document home O2needs. Pt took 4 standing rest breaks for dyspnea 2/4 and desats <90%, although rest breaks encouraged by PT because pt did not state she was dyspneic until PT heard pt's work of breathing with correlating low sats.     Stairs             Wheelchair Mobility    Modified Rankin (Stroke Patients Only)       Balance Overall balance assessment: Needs assistance Sitting-balance support: No upper extremity supported;Feet supported Sitting balance-Leahy Scale: Good       Standing balance-Leahy Scale: Poor Standing balance comment: relies on RW and PT for support                             Cognition Arousal/Alertness: Awake/alert Behavior During Therapy: WFL for tasks assessed/performed Overall Cognitive Status: Impaired/Different from baseline Area of Impairment: Memory;Following commands;Safety/judgement;Problem solving                     Memory: Decreased  short-term memory Following Commands: Follows one step commands with increased time Safety/Judgement: Decreased awareness of safety   Problem Solving: Decreased initiation;Difficulty sequencing;Requires verbal cues;Requires tactile cues;Slow processing General Comments: Pt oriented to date, self, and family members in the room. Pt with decreased safety awareness with ambulation, especially with RW use, and decreased awareness of dropping sats.       Exercises      General Comments        Pertinent Vitals/Pain Pain Assessment: No/denies pain    Home  Living                      Prior Function            PT Goals (current goals can now be found in the care plan section) Acute Rehab PT Goals Patient Stated Goal: Regain IND Time For Goal Achievement: 02/15/18 Potential to Achieve Goals: Fair Progress towards PT goals: Progressing toward goals    Frequency    Min 3X/week      PT Plan Current plan remains appropriate;Other (comment)(home O2 needs )    Co-evaluation              AM-PAC PT "6 Clicks" Mobility   Outcome Measure  Help needed turning from your back to your side while in a flat bed without using bedrails?: A Little Help needed moving from lying on your back to sitting on the side of a flat bed without using bedrails?: A Little Help needed moving to and from a bed to a chair (including a wheelchair)?: A Little Help needed standing up from a chair using your arms (e.g., wheelchair or bedside chair)?: A Little Help needed to walk in hospital room?: A Little Help needed climbing 3-5 steps with a railing? : A Lot 6 Click Score: 17    End of Session Equipment Utilized During Treatment: Gait belt;Oxygen Activity Tolerance: Patient tolerated treatment well Patient left: with call bell/phone within reach;with family/visitor present;in chair Nurse Communication: Mobility status PT Visit Diagnosis: Unsteadiness on feet (R26.81);Muscle weakness (generalized) (M62.81);Difficulty in walking, not elsewhere classified (R26.2)     Time: 1120-1140 PT Time Calculation (min) (ACUTE ONLY): 20 min  Charges:  $Gait Training: 8-22 mins                     Julien Girt, PT Acute Rehabilitation Services Pager [phone]  Office [phone]   [place] 02/06/2018, 11:55 AM

## 2018-02-06 NOTE — Progress Notes (Signed)
PROGRESS NOTE    Elizabeth Calhoun  JGG:[id number] DOB: 03-28-1945 DOA: 01/29/2018 PCP: Seward Carol, MD    Brief Narrative:  Keltie Labell a 73 y.o.femalewith medical history significant ofhypertension, hyperlipidemia, type II diabetes mellitus, previous CVA, recently diagnosed PE on Xarelto,metastatic lung cancer(s/p first round ofpalliative radiation therapy), whopresents with generalized weakness, poor appetite, fever and chills, mild confusion.  Admitted for sepsis of unclear etiology.  Treated with IV antibiotics empirically.  Hospital course complicated by persistent recurrent fevers despite empiric treatment for HCAP.  ID consulted and following.  Independently reviewed her CT chest which revealed known lung mass left apical. and bilateral nodular densities.  Lactic acid, procalcitonin, WBC, blood cultures, unremarkable.  Influenza a and B negative.  Respiratory viral panel negative.  Possible radiation pneumonitis? discussed with radiation oncology Dr. [name] and Dr [name] who are not convinced of this.  Overnight patient had another temp of 101 and earlier this morning patient had a temperature of 101.  Family is a little frustrated that her fevers have not been improving.  But patient is calm and comfortable and she denies any complaints at this time.  Assessment & Plan:   Principal Problem:   Sepsis ([place]) Active Problems:   Bone metastasis ([place])   Pulmonary embolism ([facility])   Lung cancer ([facility])   HTN (hypertension)   HLD (hyperlipidemia)   Hyponatremia   Stroke ([place])   Normocytic anemia   Diabetes mellitus without complication ([place])   Acute metabolic encephalopathy   Palliative care by specialist   Goals of care, counseling/discussion   Sepsis probably secondary to postobstructive pneumonia Patient on admission had fever, tachycardia was  tachypneic and hypotensive which have all resolved except for the fever. Blood cultures have been negative so far.   Patient has received about 7 days of IV antibiotics.  Currently she is on IV Zosyn from  02/03/2018.today is day 8 of antibiotics.  ID consulted and recommendations given. Repeat CXR showed worsening haziness on the right, probably pulm edema as pt is on IV fluids and appears slightly fluid overloaded.  One dose of lasix ordered.  Blood cultures done and pending, negative so far.   Discussed with Dr Lamonte Sakai and reviewed the CXR and CT scan of the chest independently, suggested there are no signs of radiation pneumonitis.  Recommending continue the antibiotics and diuretics .    History of left lung cancer with bone mets. Follows up with Dr. [name].    Hyponatremia Probably secondary to hydrochlorothiazide.  It appears to have resolved.   Hypertension Well-controlled.   History of pulmonary embolism On Xarelto.  History of stroke/ 5 mm lesion in the brain patient is on Xarelto and Crestor at this time   Moderate protein calorie malnutrition Nutrition consulted continue with Glucerna 3 times daily.   Acute metabolic encephalopathy Resolved Obstipation Continue with bowel regimen with Senokot med MiraLAX daily.  Type 2 diabetes mellitus with hyperglycemia Well-controlled CBG (last 3)  Recent Labs    02/05/18 2239 02/06/18 0748 02/06/18 1141  GLUCAP 165* 214* 271*  resume SSI.     Anemia of chronic disease Hemoglobin around 8 .  Transfuse to keep hemoglobin greater than 7. No obvious signs of bleeding.        DVT prophylaxis: Xarelto Code Status: Full code Family Communication: Discussed with daughter at bedside Disposition Plan: Pending resolution of fevers.  Consultants:   Infectious disease  Oncology  Procedures: None Antimicrobials: 5 days of IV cefepime followed by 3 days of IV Zosyn,  day 8 of IV antibiotics  Subjective: NO fever overnight.   Objective: Vitals:   02/05/18 2248 02/06/18 0534 02/06/18 0729 02/06/18 0818  BP: 132/74 140/71   135/73  Pulse: 89 70  74  Resp: 20 18  16   Temp: 99.5 F (37.5 C) 97.7 F (36.5 C)  98.3 F (36.8 C)  TempSrc: Oral Oral  Oral  SpO2:  100% 100% 100%  Weight:      Height:        Intake/Output Summary (Last 24 hours) at 02/06/2018 1337 Last data filed at 02/06/2018 0956 Gross per 24 hour  Intake 360 ml  Output 800 ml  Net -440 ml   Filed Weights   01/30/18 1643  Weight: 65.4 kg    Examination:  General exam: Appears calm and comfortable on 3 lit of [place] oxygen.  Respiratory system: diminished at bases.  Respiratory effort normal. No wheezing or rhonchi.  Cardiovascular system: S1 & S2 heard, RRR. No JVD, murmurs,  Gastrointestinal system: Abdomen is nondistended, soft and nontender.  Central nervous system: Alert and oriented. No focal neurological deficits. Extremities: Symmetric 5 x 5 power. Skin: No rashes, lesions or ulcers Psychiatry: Judgement and insight appear normal.    Data Reviewed: I have personally reviewed following labs and imaging studies  CBC: Recent Labs  Lab 01/31/18 1718 02/01/18 0421 02/02/18 0415 02/03/18 0800 02/04/18 0431 02/04/18 0707 02/05/18 0359  WBC 8.6 6.8 7.4 8.5 5.7  --  6.1  NEUTROABS 6.0  --   --   --   --   --   --   HGB 7.0* 8.0* 8.2* 10.1* 7.7* 7.8* 8.1*  HCT 23.2* 26.0* 26.1* 31.2* 25.2* 25.3* 26.9*  MCV 96.7 94.2 93.5 89.9 94.4  --  95.7  PLT 285 255 255 305 301  --  102   Basic Metabolic Panel: Recent Labs  Lab 02/01/18 0421 02/02/18 0415 02/03/18 0800 02/04/18 0431 02/05/18 0359  NA 133* 135 137 135 139  K 3.3* 3.8 4.5 3.9 3.6  CL 104 105 107 107 112*  CO2 21* 21* 21* 19* 21*  GLUCOSE 113* 117* 112* 154* 91  BUN 10 11 10 12 10   CREATININE 0.77 0.70 0.87 0.86 0.91  CALCIUM 7.5* 7.5* 8.2* 7.9* 7.9*   GFR: Estimated Creatinine Clearance: 46.5 mL/min (by C-G formula based on SCr of 0.91 mg/dL). Liver Function Tests: No results for input(s): AST, ALT, ALKPHOS, BILITOT, PROT, ALBUMIN in the last 168 hours. No  results for input(s): LIPASE, AMYLASE in the last 168 hours. No results for input(s): AMMONIA in the last 168 hours. Coagulation Profile: No results for input(s): INR, PROTIME in the last 168 hours. Cardiac Enzymes: No results for input(s): CKTOTAL, CKMB, CKMBINDEX, TROPONINI in the last 168 hours. BNP (last 3 results) No results for input(s): PROBNP in the last 8760 hours. HbA1C: No results for input(s): HGBA1C in the last 72 hours. CBG: Recent Labs  Lab 02/05/18 1158 02/05/18 1707 02/05/18 2239 02/06/18 0748 02/06/18 1141  GLUCAP 94 149* 165* 214* 271*   Lipid Profile: No results for input(s): CHOL, HDL, LDLCALC, TRIG, CHOLHDL, LDLDIRECT in the last 72 hours. Thyroid Function Tests: No results for input(s): TSH, T4TOTAL, FREET4, T3FREE, THYROIDAB in the last 72 hours. Anemia Panel: No results for input(s): VITAMINB12, FOLATE, FERRITIN, TIBC, IRON, RETICCTPCT in the last 72 hours. Sepsis Labs: No results for input(s): PROCALCITON, LATICACIDVEN in the last 168 hours.  Recent Results (from the past 240 hour(s))  Culture, blood (Routine x 2)  Status: None   Collection Time: 01/29/18  9:50 PM  Result Value Ref Range Status   Specimen Description   Final    BLOOD RIGHT FOREARM Performed at [facility], [place] [address], [place], [place] [address]    Special Requests   Final    BOTTLES DRAWN AEROBIC AND ANAEROBIC Blood Culture adequate volume Performed at [facility] [address], [place], [place] [address]    Culture   Final    NO GROWTH 5 DAYS Performed at [facility], [place] [address], [place], [place] [address]    Report Status 02/04/2018 FINAL  Final  Culture, blood (Routine x 2)     Status: None   Collection Time: 01/29/18  9:55 PM  Result Value Ref Range Status   Specimen Description   Final    BLOOD LEFT ANTECUBITAL Performed at [facility] [address], [place], [place] [address]    Special Requests    Final    BOTTLES DRAWN AEROBIC AND ANAEROBIC Blood Culture results may not be optimal due to an inadequate volume of blood received in culture bottles Performed at [facility] [address], [place], [place] [address]    Culture   Final    NO GROWTH 5 DAYS Performed at [facility], [place] [address], [place], [place] [address]    Report Status 02/04/2018 FINAL  Final  Urine Culture     Status: Abnormal   Collection Time: 01/30/18  4:41 AM  Result Value Ref Range Status   Specimen Description   Final    URINE, CLEAN CATCH Performed at [facility], [place] [address], [place], [place] [address]    Special Requests   Final    NONE Performed at [facility], [place] [address] [place], [place] [address]    Culture MULTIPLE SPECIES PRESENT, SUGGEST RECOLLECTION (A)  Final   Report Status 01/31/2018 FINAL  Final  MRSA PCR Screening     Status: None   Collection Time: 01/30/18  4:55 AM  Result Value Ref Range Status   MRSA by PCR NEGATIVE NEGATIVE Final    Comment:        The GeneXpert MRSA Assay (FDA approved for NASAL specimens only), is one component of a comprehensive MRSA colonization surveillance program. It is not intended to diagnose MRSA infection nor to guide or monitor treatment for MRSA infections. Performed at [facility], [place] [address], [place], [place] [address]   Respiratory Panel by PCR     Status: None   Collection Time: 01/30/18 11:19 AM  Result Value Ref Range Status   Adenovirus NOT DETECTED NOT DETECTED Final   Coronavirus 229E NOT DETECTED NOT DETECTED Final   Coronavirus HKU1 NOT DETECTED NOT DETECTED Final   Coronavirus NL63 NOT DETECTED NOT DETECTED Final   Coronavirus OC43 NOT DETECTED NOT DETECTED Final   Metapneumovirus NOT DETECTED NOT DETECTED Final   Rhinovirus / Enterovirus NOT DETECTED NOT DETECTED Final   Influenza A NOT DETECTED NOT DETECTED Final   Influenza B  NOT DETECTED NOT DETECTED Final   Parainfluenza Virus 1 NOT DETECTED NOT DETECTED Final   Parainfluenza Virus 2 NOT DETECTED NOT DETECTED Final   Parainfluenza Virus 3 NOT DETECTED NOT DETECTED Final   Parainfluenza Virus 4 NOT DETECTED NOT DETECTED Final   Respiratory Syncytial Virus NOT DETECTED NOT DETECTED Final   Bordetella pertussis NOT DETECTED NOT DETECTED Final  Chlamydophila pneumoniae NOT DETECTED NOT DETECTED Final   Mycoplasma pneumoniae NOT DETECTED NOT DETECTED Final    Comment: Performed at [facility], [place] [address] [place], [place] [address]  Culture, blood (routine x 2)     Status: None   Collection Time: 01/31/18  5:18 PM  Result Value Ref Range Status   Specimen Description   Final    BLOOD LEFT ARM Performed at [facility] [address], [place], [place] [address]    Special Requests   Final    BOTTLES DRAWN AEROBIC ONLY Blood Culture adequate volume Performed at [facility] [address], [place], [place] [address]    Culture   Final    NO GROWTH 5 DAYS Performed at [facility], [place] [address] [place], [place] [address]    Report Status 02/06/2018 FINAL  Final  Culture, blood (routine x 2)     Status: None   Collection Time: 01/31/18  5:18 PM  Result Value Ref Range Status   Specimen Description   Final    BLOOD RIGHT ARM Performed at [facility] [address] [place], [place] [address]    Special Requests   Final    BOTTLES DRAWN AEROBIC ONLY Blood Culture results may not be optimal due to an inadequate volume of blood received in culture bottles Performed at [facility] [address], [place], [place] [address]    Culture   Final    NO GROWTH 5 DAYS Performed at [facility], [place] [address] [place], [place] [address]    Report Status 02/06/2018 FINAL  Final  Culture, blood (Routine X 2) w Reflex to ID Panel     Status: None (Preliminary  result)   Collection Time: 02/05/18 11:25 AM  Result Value Ref Range Status   Specimen Description   Final    BLOOD LEFT ARM Performed at [facility] [address], [place], [place] [address]    Special Requests   Final    BOTTLES DRAWN AEROBIC ONLY Blood Culture results may not be optimal due to an inadequate volume of blood received in culture bottles Performed at [facility] [address] [place], [place] [address]    Culture   Final    NO GROWTH < 24 HOURS Performed at [facility] [address] [place], [place] [address]    Report Status PENDING  Incomplete  Culture, blood (Routine X 2) w Reflex to ID Panel     Status: None (Preliminary result)   Collection Time: 02/05/18 11:43 AM  Result Value Ref Range Status   Specimen Description   Final    BLOOD LEFT HAND Performed at [facility] [address] [place], [place] [address]    Special Requests   Final    BOTTLES DRAWN AEROBIC AND ANAEROBIC Blood Culture adequate volume Performed at [facility] [address], [place],  [address]    Culture   Final    NO GROWTH < 24 HOURS Performed at [facility] [address], [place],  [address]    Report Status PENDING  Incomplete         Radiology Studies: Dg Chest 2 View  Result Date: 02/05/2018 CLINICAL DATA:  Fever, lung cancer EXAM: CHEST - 2 VIEW COMPARISON:  02/01/2018 Correlation: CT chest 01/30/2018 FINDINGS: Enlargement of cardiac silhouette. Atherosclerotic calcification aorta. Mediastinal  contours and pulmonary vascularity normal. BILATERAL pulmonary infiltrates consistent with pneumonia. This includes a focal area of opacity in LEFT mid lung similar to the previous exam. RIGHT lung infiltrates increased since previous study and now with small RIGHT pleural effusion. Medial LEFT apical neoplasm identified by previous imaging is not well visualized by  this study. No pneumothorax. Bones demineralized with multilevel endplate spur formation thoracic spine. IMPRESSION: COPD changes with BILATERAL pulmonary infiltrates consistent with pneumonia, progressive in lower RIGHT lung since previous exam and now associated with a small RIGHT pleural effusion. Known LEFT apical neoplasm, poorly visualized. Electronically Signed   By: Lavonia Dana M.D.   On: 02/05/2018 14:12        Scheduled Meds: . feeding supplement (GLUCERNA SHAKE)  237 mL Oral TID BM  . fentaNYL  1 patch Transdermal Q72H  . guaiFENesin  1,200 mg Oral BID  . insulin aspart  0-9 Units Subcutaneous TID WC  . insulin glargine  5 Units Subcutaneous QHS  . ipratropium-albuterol  3 mL Nebulization TID  . losartan  25 mg Oral Daily  . mouth rinse  15 mL Mouth Rinse BID  . Rivaroxaban  15 mg Oral BID WC  . [START ON 02/11/2018] rivaroxaban  20 mg Oral Q supper  . rosuvastatin  40 mg Oral q1800   Continuous Infusions: . piperacillin-tazobactam (ZOSYN)  IV 3.375 g (02/06/18 0535)     LOS: 7 days    Time spent: 28 minutes    Hosie Poisson, MD [facility] Pager [phone]  If 7PM-7AM, please contact night-coverage [email or web address] Password [email or web address] 02/06/2018, 1:37 PM

## 2018-02-06 NOTE — Care Management Important Message (Signed)
Important Message  Patient Details  Name: Elizabeth Calhoun MRN: [id number] Date of Birth: [birth year]   Medicare Important Message Given:  Yes    Kerin Salen 02/06/2018, 10:53 AMImportant Message  Patient Details  Name: Elizabeth Calhoun MRN: [id number] Date of Birth: Nov 13, 1945   Medicare Important Message Given:  Yes    Kerin Salen 02/06/2018, 10:53 AM

## 2018-02-06 NOTE — Care Management (Signed)
Received a call from the nurse who states the patient will need home O2. Qualifying sats documented, order received, contacted [facility] to deliver to the room.

## 2018-02-06 NOTE — Progress Notes (Signed)
SATURATION QUALIFICATIONS: (This note is used to comply with regulatory documentation for home oxygen)  Patient Saturations on Room Air at Rest = 79%  Patient Saturations on Room Air while Ambulating =85 %  Patient Saturations on 3 Liters of oxygen while Ambulating = 92%  Please briefly explain why patient needs home oxygen:   Pt has lung cancer and is requiring oxygen both at rest and while ambulating.

## 2018-02-06 NOTE — Progress Notes (Signed)
Inpatient Diabetes Program Recommendations  AACE/ADA: New Consensus Statement on Inpatient Glycemic Control (2015)  Target Ranges:  Prepandial:   less than 140 mg/dL      Peak postprandial:   less than 180 mg/dL (1-2 hours)      Critically ill patients:  140 - 180 mg/dL   Lab Results  Component Value Date   GLUCAP 271 (H) 02/06/2018   HGBA1C 10.4 (H) 01/30/2018    Review of Glycemic Control  Diabetes history: DM2 Outpatient Diabetes medications: Tresiba 10 units QHS Current orders for Inpatient glycemic control: Lantus 5 units QHS, Novolog 0-9 units tidwc  HgbA1C - 10.4% - doubtful this is accurate d/t low H/H. May benefit from addition of meal coverage insulin while on steroids.   Inpatient Diabetes Program Recommendations:     Increase Lantus to 10units QHS Add Novolog 3 units tidwc  Will continue to follow closely.  Thank you. Lorenda Peck, RD, LDN, CDE Inpatient Diabetes Coordinator [phone]

## 2018-02-07 ENCOUNTER — Telehealth: Payer: Self-pay | Admitting: *Deleted

## 2018-02-07 ENCOUNTER — Telehealth: Payer: Self-pay

## 2018-02-07 LAB — CBC WITH DIFFERENTIAL/PLATELET
Abs Immature Granulocytes: 0.14 10*3/uL — ABNORMAL HIGH (ref 0.00–0.07)
Basophils Absolute: 0 10*3/uL (ref 0.0–0.1)
Basophils Relative: 0 %
Eosinophils Absolute: 0 10*3/uL (ref 0.0–0.5)
Eosinophils Relative: 0 %
HCT: 27.4 % — ABNORMAL LOW (ref 36.0–46.0)
Hemoglobin: 8.5 g/dL — ABNORMAL LOW (ref 12.0–15.0)
Immature Granulocytes: 2 %
Lymphocytes Relative: 7 %
Lymphs Abs: 0.6 10*3/uL — ABNORMAL LOW (ref 0.7–4.0)
MCH: 29 pg (ref 26.0–34.0)
MCHC: 31 g/dL (ref 30.0–36.0)
MCV: 93.5 fL (ref 80.0–100.0)
MONO ABS: 0.6 10*3/uL (ref 0.1–1.0)
Monocytes Relative: 7 %
Neutro Abs: 7 10*3/uL (ref 1.7–7.7)
Neutrophils Relative %: 84 %
Platelets: 403 10*3/uL — ABNORMAL HIGH (ref 150–400)
RBC: 2.93 MIL/uL — ABNORMAL LOW (ref 3.87–5.11)
RDW: 15.3 % (ref 11.5–15.5)
WBC: 8.3 10*3/uL (ref 4.0–10.5)
nRBC: 0 % (ref 0.0–0.2)

## 2018-02-07 LAB — GLUCOSE, CAPILLARY
GLUCOSE-CAPILLARY: 227 mg/dL — AB (ref 70–99)
Glucose-Capillary: 239 mg/dL — ABNORMAL HIGH (ref 70–99)

## 2018-02-07 LAB — BASIC METABOLIC PANEL
Anion gap: 9 (ref 5–15)
BUN: 17 mg/dL (ref 8–23)
CHLORIDE: 104 mmol/L (ref 98–111)
CO2: 23 mmol/L (ref 22–32)
Calcium: 8.6 mg/dL — ABNORMAL LOW (ref 8.9–10.3)
Creatinine, Ser: 1.01 mg/dL — ABNORMAL HIGH (ref 0.44–1.00)
GFR calc Af Amer: >60 mL/min (ref >60)
GFR calc non Af Amer: 55 mL/min — ABNORMAL LOW (ref >60)
Glucose, Bld: 250 mg/dL — ABNORMAL HIGH (ref 70–99)
Potassium: 3.6 mmol/L (ref 3.5–5.1)
Sodium: 136 mmol/L (ref 135–145)

## 2018-02-07 MED ORDER — IPRATROPIUM-ALBUTEROL 0.5-2.5 (3) MG/3ML IN SOLN: 3.0000 mL | Freq: Four times a day (QID) | RESPIRATORY_TRACT | 4 refills | Status: DC | PRN

## 2018-02-07 MED ORDER — GLUCERNA SHAKE PO LIQD: 237.0000 mL | Freq: Three times a day (TID) | ORAL | 0 refills | Status: DC

## 2018-02-07 MED ORDER — AMOXICILLIN-POT CLAVULANATE 875-125 MG PO TABS: 1.0000 | ORAL_TABLET | Freq: Two times a day (BID) | ORAL | 0 refills | Status: DC

## 2018-02-07 MED ORDER — HYDROCHLOROTHIAZIDE 25 MG PO TABS: 12.5000 mg | ORAL_TABLET | Freq: Every day | ORAL | 0 refills | Status: DC

## 2018-02-07 NOTE — Telephone Encounter (Signed)
Per 1/31 walk in scheduled patient

## 2018-02-07 NOTE — Telephone Encounter (Signed)
Patient daughter called and stated mother is being discharged from hospital and asked when mother should be seen. Per Dr. [name], schedule patient for appt in 2 weeks. Contacted daughter Lakeitha [phone] to give information and advised that she would be contacted by scheduling. Scheduling message sent. Daughter verbalized understanding

## 2018-02-07 NOTE — Telephone Encounter (Signed)
Per patient release from the hospital on 1/31 r/s missed appointment. Per patient 1/31 patient request

## 2018-02-07 NOTE — Progress Notes (Signed)
Discharge instructions given to patient and daughter. Patient and daughter verbalized understanding and all questions were answered.

## 2018-02-07 NOTE — Progress Notes (Signed)
Occupational Therapy Treatment Patient Details Name: Elizabeth Calhoun MRN: [id number] DOB: 08-20-1945 Today's Date: 02/07/2018    History of present illness Pt admitted with generalized weakness and sepsis.  Pt wtih hx of CVA, Stage IV lung CA with spinal mets, DM, and recent PE   OT comments  All education completed. Pt will benefit from shower seat for tub. Daughter present. No further OT needs at this time.   Follow Up Recommendations  Supervision/Assistance - 24 hour;Home health OT    Equipment Recommendations  Tub/shower seat(family will get shower seat)    Recommendations for Other Services      Precautions / Restrictions Precautions Precautions: Fall Precaution Comments: 2-3L O2 via [place] Restrictions Weight Bearing Restrictions: No       Mobility Bed Mobility               General bed mobility comments: oob  Transfers   Equipment used: Rolling walker (2 wheeled)   Sit to Stand: Supervision         General transfer comment: cues for UE placement    Balance                                           ADL either performed or assessed with clinical judgement   ADL                           Toilet Transfer: Min guard;Ambulation;RW(simulated w/c.  assist for lines)       Tub/ Shower Transfer: Min guard;Ambulation;3 in 1;Rolling walker     General ADL Comments: pt can benefit from tub seat; she can get into tub with min guard and step by step cues.  Daughter present and feels this will work better than tub bench in pt's environment     Vision       Perception     Praxis      Cognition Arousal/Alertness: Awake/alert Behavior During Therapy: [facility] for tasks assessed/performed                           Following Commands: Follows one step commands with increased time(and multimodal cues) Safety/Judgement: Decreased awareness of safety   Problem Solving: Requires verbal cues;Requires tactile cues          Exercises     Shoulder Instructions       General Comments      Pertinent Vitals/ Pain       Pain Assessment: No/denies pain  Home Living                                          Prior Functioning/Environment              Frequency  Min 2X/week        Progress Toward Goals  OT Goals(current goals can now be found in the care plan section)  Progress towards OT goals: Goals met/education completed, patient discharged from OT(family can manage pt at current level)     Plan      Co-evaluation                 AM-PAC OT "6 Clicks" Daily Activity     Outcome Measure  Help from another person eating meals?: A Little Help from another person taking care of personal grooming?: A Little Help from another person toileting, which includes using toliet, bedpan, or urinal?: A Little Help from another person bathing (including washing, rinsing, drying)?: A Little Help from another person to put on and taking off regular upper body clothing?: A Little Help from another person to put on and taking off regular lower body clothing?: A Little 6 Click Score: 18    End of Session    OT Visit Diagnosis: Muscle weakness (generalized) (M62.81)   Activity Tolerance Patient tolerated treatment well   Patient Left in chair;with call bell/phone within reach;with family/visitor present   Nurse Communication          Time: 3507-5732 OT Time Calculation (min): 31 min  Charges: OT General Charges $OT Visit: 1 Visit OT Treatments $Self Care/Home Management : 23-37 mins  Lesle Chris, OTR/L Acute Rehabilitation Services [phone] [facility] pager [phone] office 02/07/2018   [place] 02/07/2018, 11:34 AM

## 2018-02-09 ENCOUNTER — Inpatient Hospital Stay (HOSPITAL_COMMUNITY): Payer: Medicare Other

## 2018-02-09 ENCOUNTER — Inpatient Hospital Stay (HOSPITAL_COMMUNITY)
Admission: EM | Admit: 2018-02-09 | Discharge: 2018-02-12 | DRG: 064 | Disposition: A | Discharge status: Hospice | Payer: Medicare Other | Attending: Internal Medicine | Admitting: Internal Medicine

## 2018-02-09 ENCOUNTER — Emergency Department (HOSPITAL_COMMUNITY): Payer: Medicare Other

## 2018-02-09 ENCOUNTER — Encounter (HOSPITAL_COMMUNITY): Payer: Self-pay | Admitting: Internal Medicine

## 2018-02-09 DIAGNOSIS — C3412 Malignant neoplasm of upper lobe, left bronchus or lung: Secondary | ICD-10-CM | POA: Diagnosis present

## 2018-02-09 DIAGNOSIS — R4701 Aphasia: Secondary | ICD-10-CM | POA: Diagnosis present

## 2018-02-09 DIAGNOSIS — H5010 Unspecified exotropia: Secondary | ICD-10-CM | POA: Diagnosis present

## 2018-02-09 DIAGNOSIS — E669 Obesity, unspecified: Secondary | ICD-10-CM | POA: Diagnosis present

## 2018-02-09 DIAGNOSIS — I6502 Occlusion and stenosis of left vertebral artery: Secondary | ICD-10-CM | POA: Diagnosis not present

## 2018-02-09 DIAGNOSIS — R402210 Coma scale, best verbal response, none, unspecified time: Secondary | ICD-10-CM | POA: Diagnosis present

## 2018-02-09 DIAGNOSIS — Z8249 Family history of ischemic heart disease and other diseases of the circulatory system: Secondary | ICD-10-CM

## 2018-02-09 DIAGNOSIS — G8191 Hemiplegia, unspecified affecting right dominant side: Secondary | ICD-10-CM | POA: Diagnosis present

## 2018-02-09 DIAGNOSIS — R404 Transient alteration of awareness: Secondary | ICD-10-CM | POA: Diagnosis not present

## 2018-02-09 DIAGNOSIS — R627 Adult failure to thrive: Secondary | ICD-10-CM | POA: Diagnosis present

## 2018-02-09 DIAGNOSIS — R4020 Unspecified coma: Secondary | ICD-10-CM | POA: Diagnosis not present

## 2018-02-09 DIAGNOSIS — I639 Cerebral infarction, unspecified: Secondary | ICD-10-CM | POA: Diagnosis not present

## 2018-02-09 DIAGNOSIS — R402212 Coma scale, best verbal response, none, at arrival to emergency department: Secondary | ICD-10-CM | POA: Diagnosis present

## 2018-02-09 DIAGNOSIS — I2699 Other pulmonary embolism without acute cor pulmonale: Secondary | ICD-10-CM | POA: Diagnosis present

## 2018-02-09 DIAGNOSIS — Z7189 Other specified counseling: Secondary | ICD-10-CM

## 2018-02-09 DIAGNOSIS — Z886 Allergy status to analgesic agent status: Secondary | ICD-10-CM

## 2018-02-09 DIAGNOSIS — Z8701 Personal history of pneumonia (recurrent): Secondary | ICD-10-CM

## 2018-02-09 DIAGNOSIS — C779 Secondary and unspecified malignant neoplasm of lymph node, unspecified: Secondary | ICD-10-CM | POA: Diagnosis present

## 2018-02-09 DIAGNOSIS — R4182 Altered mental status, unspecified: Secondary | ICD-10-CM | POA: Diagnosis present

## 2018-02-09 DIAGNOSIS — G9341 Metabolic encephalopathy: Secondary | ICD-10-CM | POA: Diagnosis present

## 2018-02-09 DIAGNOSIS — C801 Malignant (primary) neoplasm, unspecified: Secondary | ICD-10-CM | POA: Diagnosis not present

## 2018-02-09 DIAGNOSIS — I63449 Cerebral infarction due to embolism of unspecified cerebellar artery: Secondary | ICD-10-CM | POA: Diagnosis not present

## 2018-02-09 DIAGNOSIS — J90 Pleural effusion, not elsewhere classified: Secondary | ICD-10-CM | POA: Diagnosis not present

## 2018-02-09 DIAGNOSIS — I10 Essential (primary) hypertension: Secondary | ICD-10-CM | POA: Diagnosis present

## 2018-02-09 DIAGNOSIS — E119 Type 2 diabetes mellitus without complications: Secondary | ICD-10-CM | POA: Diagnosis present

## 2018-02-09 DIAGNOSIS — Z66 Do not resuscitate: Secondary | ICD-10-CM | POA: Diagnosis present

## 2018-02-09 DIAGNOSIS — Z823 Family history of stroke: Secondary | ICD-10-CM

## 2018-02-09 DIAGNOSIS — R569 Unspecified convulsions: Secondary | ICD-10-CM

## 2018-02-09 DIAGNOSIS — R402142 Coma scale, eyes open, spontaneous, at arrival to emergency department: Secondary | ICD-10-CM | POA: Diagnosis present

## 2018-02-09 DIAGNOSIS — Z8673 Personal history of transient ischemic attack (TIA), and cerebral infarction without residual deficits: Secondary | ICD-10-CM

## 2018-02-09 DIAGNOSIS — J449 Chronic obstructive pulmonary disease, unspecified: Secondary | ICD-10-CM | POA: Diagnosis present

## 2018-02-09 DIAGNOSIS — G459 Transient cerebral ischemic attack, unspecified: Secondary | ICD-10-CM | POA: Diagnosis not present

## 2018-02-09 DIAGNOSIS — Z90710 Acquired absence of both cervix and uterus: Secondary | ICD-10-CM

## 2018-02-09 DIAGNOSIS — C7931 Secondary malignant neoplasm of brain: Secondary | ICD-10-CM | POA: Diagnosis present

## 2018-02-09 DIAGNOSIS — C3490 Malignant neoplasm of unspecified part of unspecified bronchus or lung: Secondary | ICD-10-CM | POA: Diagnosis not present

## 2018-02-09 DIAGNOSIS — E785 Hyperlipidemia, unspecified: Secondary | ICD-10-CM | POA: Diagnosis present

## 2018-02-09 DIAGNOSIS — R2990 Unspecified symptoms and signs involving the nervous system: Secondary | ICD-10-CM

## 2018-02-09 DIAGNOSIS — C7951 Secondary malignant neoplasm of bone: Secondary | ICD-10-CM | POA: Insufficient documentation

## 2018-02-09 DIAGNOSIS — Z9981 Dependence on supplemental oxygen: Secondary | ICD-10-CM

## 2018-02-09 DIAGNOSIS — R000 Tachycardia, unspecified: Secondary | ICD-10-CM | POA: Diagnosis not present

## 2018-02-09 DIAGNOSIS — R402362 Coma scale, best motor response, obeys commands, at arrival to emergency department: Secondary | ICD-10-CM | POA: Diagnosis present

## 2018-02-09 DIAGNOSIS — R29711 NIHSS score 11: Secondary | ICD-10-CM | POA: Diagnosis present

## 2018-02-09 DIAGNOSIS — Z820 Family history of epilepsy and other diseases of the nervous system: Secondary | ICD-10-CM

## 2018-02-09 DIAGNOSIS — R4781 Slurred speech: Secondary | ICD-10-CM | POA: Diagnosis not present

## 2018-02-09 DIAGNOSIS — G9340 Encephalopathy, unspecified: Secondary | ICD-10-CM | POA: Diagnosis not present

## 2018-02-09 DIAGNOSIS — Z79899 Other long term (current) drug therapy: Secondary | ICD-10-CM

## 2018-02-09 DIAGNOSIS — I6523 Occlusion and stenosis of bilateral carotid arteries: Secondary | ICD-10-CM | POA: Diagnosis not present

## 2018-02-09 DIAGNOSIS — Z794 Long term (current) use of insulin: Secondary | ICD-10-CM

## 2018-02-09 DIAGNOSIS — R52 Pain, unspecified: Secondary | ICD-10-CM | POA: Diagnosis not present

## 2018-02-09 DIAGNOSIS — R29810 Facial weakness: Secondary | ICD-10-CM | POA: Diagnosis not present

## 2018-02-09 DIAGNOSIS — Z7901 Long term (current) use of anticoagulants: Secondary | ICD-10-CM

## 2018-02-09 DIAGNOSIS — I6322 Cerebral infarction due to unspecified occlusion or stenosis of basilar arteries: Secondary | ICD-10-CM | POA: Diagnosis not present

## 2018-02-09 DIAGNOSIS — R29818 Other symptoms and signs involving the nervous system: Secondary | ICD-10-CM | POA: Diagnosis not present

## 2018-02-09 DIAGNOSIS — M2550 Pain in unspecified joint: Secondary | ICD-10-CM | POA: Diagnosis not present

## 2018-02-09 DIAGNOSIS — Z7401 Bed confinement status: Secondary | ICD-10-CM | POA: Diagnosis not present

## 2018-02-09 DIAGNOSIS — I2609 Other pulmonary embolism with acute cor pulmonale: Secondary | ICD-10-CM | POA: Diagnosis not present

## 2018-02-09 DIAGNOSIS — Z833 Family history of diabetes mellitus: Secondary | ICD-10-CM

## 2018-02-09 DIAGNOSIS — Z86010 Personal history of colonic polyps: Secondary | ICD-10-CM

## 2018-02-09 DIAGNOSIS — D6869 Other thrombophilia: Secondary | ICD-10-CM | POA: Diagnosis not present

## 2018-02-09 LAB — DIFFERENTIAL
Abs Immature Granulocytes: 0.15 10*3/uL — ABNORMAL HIGH (ref 0.00–0.07)
Basophils Absolute: 0 10*3/uL (ref 0.0–0.1)
Basophils Relative: 0 %
Eosinophils Absolute: 0.1 10*3/uL (ref 0.0–0.5)
Eosinophils Relative: 1 %
Immature Granulocytes: 2 %
Lymphocytes Relative: 10 %
Lymphs Abs: 0.9 10*3/uL (ref 0.7–4.0)
Monocytes Absolute: 0.7 10*3/uL (ref 0.1–1.0)
Monocytes Relative: 8 %
Neutro Abs: 7 10*3/uL (ref 1.7–7.7)
Neutrophils Relative %: 79 %

## 2018-02-09 LAB — POCT I-STAT 7, (LYTES, BLD GAS, ICA,H+H)
Acid-Base Excess: 4 mmol/L — ABNORMAL HIGH (ref 0.0–2.0)
Bicarbonate: 27.9 mmol/L (ref 20.0–28.0)
Calcium, Ion: 1.18 mmol/L (ref 1.15–1.40)
HCT: 26 % — ABNORMAL LOW (ref 36.0–46.0)
Hemoglobin: 8.8 g/dL — ABNORMAL LOW (ref 12.0–15.0)
O2 Saturation: 98 %
Patient temperature: 98.7
Potassium: 3.2 mmol/L — ABNORMAL LOW (ref 3.5–5.1)
Sodium: 135 mmol/L (ref 135–145)
TCO2: 29 mmol/L (ref 22–32)
pCO2 arterial: 37.2 mmHg (ref 32.0–48.0)
pH, Arterial: 7.482 — ABNORMAL HIGH (ref 7.350–7.450)
pO2, Arterial: 99 mmHg (ref 83.0–108.0)

## 2018-02-09 LAB — COMPREHENSIVE METABOLIC PANEL
ALT: 13 U/L (ref 0–44)
AST: 27 U/L (ref 15–41)
Albumin: 2.3 g/dL — ABNORMAL LOW (ref 3.5–5.0)
Alkaline Phosphatase: 159 U/L — ABNORMAL HIGH (ref 38–126)
Anion gap: 12 (ref 5–15)
BUN: 10 mg/dL (ref 8–23)
CO2: 26 mmol/L (ref 22–32)
Calcium: 8.6 mg/dL — ABNORMAL LOW (ref 8.9–10.3)
Chloride: 98 mmol/L (ref 98–111)
Creatinine, Ser: 0.87 mg/dL (ref 0.44–1.00)
GFR calc Af Amer: >60 mL/min (ref >60)
GFR calc non Af Amer: >60 mL/min (ref >60)
Glucose, Bld: 167 mg/dL — ABNORMAL HIGH (ref 70–99)
Potassium: 3.2 mmol/L — ABNORMAL LOW (ref 3.5–5.1)
Sodium: 136 mmol/L (ref 135–145)
Total Bilirubin: 0.6 mg/dL (ref 0.3–1.2)
Total Protein: 6.8 g/dL (ref 6.5–8.1)

## 2018-02-09 LAB — CBC
HCT: 29.8 % — ABNORMAL LOW (ref 36.0–46.0)
Hemoglobin: 9.7 g/dL — ABNORMAL LOW (ref 12.0–15.0)
MCH: 29.1 pg (ref 26.0–34.0)
MCHC: 32.6 g/dL (ref 30.0–36.0)
MCV: 89.5 fL (ref 80.0–100.0)
Platelets: 404 10*3/uL — ABNORMAL HIGH (ref 150–400)
RBC: 3.33 MIL/uL — ABNORMAL LOW (ref 3.87–5.11)
RDW: 15.2 % (ref 11.5–15.5)
WBC: 8.8 10*3/uL (ref 4.0–10.5)
nRBC: 0 % (ref 0.0–0.2)

## 2018-02-09 LAB — URINALYSIS, ROUTINE W REFLEX MICROSCOPIC
Bilirubin Urine: NEGATIVE
Glucose, UA: 50 mg/dL — AB
HGB URINE DIPSTICK: NEGATIVE
Ketones, ur: 5 mg/dL — AB
Leukocytes, UA: NEGATIVE
Nitrite: NEGATIVE
Protein, ur: 100 mg/dL — AB
Specific Gravity, Urine: 1.02 (ref 1.005–1.030)
pH: 6 (ref 5.0–8.0)

## 2018-02-09 LAB — PROTIME-INR
INR: 2.49
Prothrombin Time: 26.6 seconds — ABNORMAL HIGH (ref 11.4–15.2)

## 2018-02-09 LAB — I-STAT CREATININE, ED: Creatinine, Ser: 0.6 mg/dL (ref 0.44–1.00)

## 2018-02-09 LAB — CBG MONITORING, ED
Glucose-Capillary: 155 mg/dL — ABNORMAL HIGH (ref 70–99)
Glucose-Capillary: 142 mg/dL — ABNORMAL HIGH (ref 70–99)
Glucose-Capillary: 163 mg/dL — ABNORMAL HIGH (ref 70–99)

## 2018-02-09 LAB — APTT: aPTT: 46 seconds — ABNORMAL HIGH (ref 24–36)

## 2018-02-09 LAB — AMMONIA: Ammonia: 12 umol/L (ref 9–35)

## 2018-02-09 LAB — I-STAT TROPONIN, ED: Troponin i, poc: 0.01 ng/mL (ref 0.00–0.08)

## 2018-02-09 MED ORDER — INSULIN ASPART 100 UNIT/ML ~~LOC~~ SOLN: 0.0000 [IU] | Freq: Every day | SUBCUTANEOUS | Status: DC

## 2018-02-09 MED ORDER — IPRATROPIUM-ALBUTEROL 0.5-2.5 (3) MG/3ML IN SOLN: 3.0000 mL | Freq: Four times a day (QID) | RESPIRATORY_TRACT | Status: DC | PRN

## 2018-02-09 MED ORDER — DEXAMETHASONE SODIUM PHOSPHATE 4 MG/ML IJ SOLN
4.0000 mg | Freq: Four times a day (QID) | INTRAMUSCULAR | Status: DC
  Administered 2018-02-10 – 2018-02-12 (×6): 4 mg via INTRAVENOUS
  Filled 2018-02-09 (×6): qty 1

## 2018-02-09 MED ORDER — GLYCOPYRROLATE 0.2 MG/ML IJ SOLN: 0.2000 mg | INTRAMUSCULAR | Status: DC | PRN

## 2018-02-09 MED ORDER — HALOPERIDOL 0.5 MG PO TABS
0.5000 mg | ORAL_TABLET | ORAL | Status: DC | PRN
  Filled 2018-02-09: qty 1

## 2018-02-09 MED ORDER — ACETAMINOPHEN 160 MG/5ML PO SOLN: 650.0000 mg | ORAL | Status: DC | PRN

## 2018-02-09 MED ORDER — GLYCOPYRROLATE 0.2 MG/ML IJ SOLN
0.2000 mg | INTRAMUSCULAR | Status: DC | PRN
  Administered 2018-02-11: 0.2 mg via INTRAVENOUS
  Filled 2018-02-09: qty 1

## 2018-02-09 MED ORDER — ONDANSETRON HCL 4 MG/2ML IJ SOLN: 4.0000 mg | Freq: Four times a day (QID) | INTRAMUSCULAR | Status: DC | PRN

## 2018-02-09 MED ORDER — ROSUVASTATIN CALCIUM 20 MG PO TABS: 40.0000 mg | ORAL_TABLET | Freq: Every day | ORAL | Status: DC

## 2018-02-09 MED ORDER — LEVETIRACETAM IN NACL 1000 MG/100ML IV SOLN
1000.0000 mg | Freq: Two times a day (BID) | INTRAVENOUS | Status: DC
  Administered 2018-02-10 – 2018-02-12 (×4): 1000 mg via INTRAVENOUS
  Filled 2018-02-09 (×6): qty 100

## 2018-02-09 MED ORDER — SODIUM CHLORIDE 0.9 % IV SOLN: INTRAVENOUS | Status: DC

## 2018-02-09 MED ORDER — SENNOSIDES-DOCUSATE SODIUM 8.6-50 MG PO TABS: 1.0000 | ORAL_TABLET | Freq: Every evening | ORAL | Status: DC | PRN

## 2018-02-09 MED ORDER — LORAZEPAM 2 MG/ML IJ SOLN: 1.0000 mg | INTRAMUSCULAR | Status: DC | PRN

## 2018-02-09 MED ORDER — DEXAMETHASONE SODIUM PHOSPHATE 4 MG/ML IJ SOLN
4.0000 mg | Freq: Once | INTRAMUSCULAR | Status: AC
  Administered 2018-02-09: 4 mg via INTRAVENOUS
  Filled 2018-02-09: qty 1

## 2018-02-09 MED ORDER — MORPHINE SULFATE (PF) 2 MG/ML IV SOLN: 1.0000 mg | INTRAVENOUS | Status: DC | PRN

## 2018-02-09 MED ORDER — GLYCOPYRROLATE 1 MG PO TABS
1.0000 mg | ORAL_TABLET | ORAL | Status: DC | PRN
  Filled 2018-02-09: qty 1

## 2018-02-09 MED ORDER — BIOTENE DRY MOUTH MT LIQD: 15.0000 mL | OROMUCOSAL | Status: DC | PRN

## 2018-02-09 MED ORDER — SODIUM CHLORIDE 0.9% FLUSH
3.0000 mL | Freq: Once | INTRAVENOUS | Status: AC
  Administered 2018-02-09: 3 mL via INTRAVENOUS

## 2018-02-09 MED ORDER — POLYVINYL ALCOHOL 1.4 % OP SOLN
1.0000 [drp] | Freq: Four times a day (QID) | OPHTHALMIC | Status: DC | PRN
  Filled 2018-02-09: qty 15

## 2018-02-09 MED ORDER — HYDROMORPHONE HCL 1 MG/ML IJ SOLN: 0.5000 mg | INTRAMUSCULAR | Status: DC | PRN

## 2018-02-09 MED ORDER — INSULIN ASPART 100 UNIT/ML ~~LOC~~ SOLN
0.0000 [IU] | Freq: Three times a day (TID) | SUBCUTANEOUS | Status: DC
  Administered 2018-02-09: 3 [IU] via SUBCUTANEOUS
  Administered 2018-02-11: 2 [IU] via SUBCUTANEOUS
  Administered 2018-02-11 (×2): 3 [IU] via SUBCUTANEOUS

## 2018-02-09 MED ORDER — GADOBUTROL 1 MMOL/ML IV SOLN
7.0000 mL | Freq: Once | INTRAVENOUS | Status: AC | PRN
  Administered 2018-02-09: 7 mL via INTRAVENOUS

## 2018-02-09 MED ORDER — LORAZEPAM 2 MG/ML PO CONC: 1.0000 mg | ORAL | Status: DC | PRN

## 2018-02-09 MED ORDER — RIVAROXABAN 15 MG PO TABS: 15.0000 mg | ORAL_TABLET | Freq: Two times a day (BID) | ORAL | Status: DC

## 2018-02-09 MED ORDER — INSULIN GLARGINE 100 UNIT/ML ~~LOC~~ SOLN
10.0000 [IU] | Freq: Every day | SUBCUTANEOUS | Status: DC
  Filled 2018-02-09: qty 0.1

## 2018-02-09 MED ORDER — HALOPERIDOL LACTATE 5 MG/ML IJ SOLN: 0.5000 mg | INTRAMUSCULAR | Status: DC | PRN

## 2018-02-09 MED ORDER — ACETAMINOPHEN 650 MG RE SUPP: 650.0000 mg | RECTAL | Status: DC | PRN

## 2018-02-09 MED ORDER — HALOPERIDOL LACTATE 2 MG/ML PO CONC
0.5000 mg | ORAL | Status: DC | PRN
  Filled 2018-02-09: qty 0.3

## 2018-02-09 MED ORDER — ACETAMINOPHEN 325 MG PO TABS: 650.0000 mg | ORAL_TABLET | ORAL | Status: DC | PRN

## 2018-02-09 MED ORDER — RIVAROXABAN (XARELTO) VTE STARTER PACK (15 & 20 MG): 15.0000 mg | ORAL_TABLET | Freq: Two times a day (BID) | ORAL | Status: DC

## 2018-02-09 MED ORDER — LEVETIRACETAM IN NACL 1000 MG/100ML IV SOLN
1000.0000 mg | Freq: Once | INTRAVENOUS | Status: AC
  Administered 2018-02-09: 1000 mg via INTRAVENOUS
  Filled 2018-02-09: qty 100

## 2018-02-09 MED ORDER — FENTANYL 12 MCG/HR TD PT72
1.0000 | MEDICATED_PATCH | TRANSDERMAL | Status: DC
  Administered 2018-02-11: 1 via TRANSDERMAL
  Filled 2018-02-09: qty 1

## 2018-02-09 MED ORDER — INSULIN DEGLUDEC 100 UNIT/ML ~~LOC~~ SOPN: 10.0000 [IU] | PEN_INJECTOR | Freq: Every day | SUBCUTANEOUS | Status: DC

## 2018-02-09 MED ORDER — LORAZEPAM 1 MG PO TABS: 1.0000 mg | ORAL_TABLET | ORAL | Status: DC | PRN

## 2018-02-09 MED ORDER — ONDANSETRON 4 MG PO TBDP: 4.0000 mg | ORAL_TABLET | Freq: Four times a day (QID) | ORAL | Status: DC | PRN

## 2018-02-09 NOTE — Consult Note (Signed)
Neurology Consultation  Reason for Consult: Code stroke Referring Physician: Dr. Ronnald Nian  CC: Unresponsiveness  History is obtained from: Chart, patient's daughters  HPI: Elizabeth Calhoun is a 73 y.o. female past medical history of recently diagnosed metastatic lung cancer with metastasis to the basal ganglia, COPD on home oxygen 2 L, diabetes, obesity, presenting to the emergency room from home with a last known normal of around 2:40 AM on 02/09/2018 and found down on the floor by daughter at 5 AM. The patient was seen in the evening normal by family, recently discharged after a hospitalization from [facility] long [facility] for pneumonia and being evaluated by oncology her about to see oncology for focused treatments of the cancer, was found on the ground by the daughter. Patient was unable to provide any history. On initial examination, she was obtunded with open eyes to noxious stimulation and the exam showed mild right hemiparesis.  EMS reported mild left hemiparesis and slurred speech and she decompensated respiratorily requiring bag mask ventilation to maintain her saturations.  Recently diagnosed pulmonary emboli started on rivaroxaban  LKW: 2:40 AM on 02/09/2018 tpa given?: no, outside the window Premorbid modified Rankin scale (mRS): 2  MLJ:QGBEEF to obtain due to altered mental status.   Past Medical History:  Diagnosis Date  . Adenomatous colon polyp   . Cancer ([place])   . Diabetes mellitus without complication ([place])   . Hemorrhoids   . Menopause   . OA (osteoarthritis)   . Obesity   . Other and unspecified hyperlipidemia   . Spine metastasis ([place])   . Stage 4 lung cancer ([place])   . Stroke [facility])     Family History  Problem Relation Age of Onset  . Heart attack Father   . Heart disease Brother        1/8  . Heart disease Brother        2/8  . Heart disease Brother        3/8  . Heart disease Brother        4/8  . Heart disease Brother        5/8  . Heart disease  Brother        6/8  . Cancer Brother        7/8  . Seizures Brother        8/8  . Stroke Brother        8/8  . Heart disease Sister        1/6  . Alzheimer's disease Sister        2/6  . Diabetes Sister        3/6  . Hypertension Sister        3/6  . Hypertension Sister        4/6  . Diabetes Sister        4/6  . Diabetes Daughter     Social History:   reports that she has never smoked. She has never used smokeless tobacco. She reports previous alcohol use. She reports that she does not use drugs.  Medications  Current Facility-Administered Medications:  .  levETIRAcetam (KEPPRA) IVPB 1000 mg/100 mL premix, 1,000 mg, Intravenous, Once, [place], Adam, DO, Last Rate: 400 mL/hr at 02/09/18 1319, 1,000 mg at 02/09/18 1319  Current Outpatient Medications:  .  amoxicillin-clavulanate (AUGMENTIN) 875-125 MG tablet, Take 1 tablet by mouth every 12 (twelve) hours for 7 days., Disp: 14 tablet, Rfl: 0 .  benzonatate (TESSALON) 100 MG capsule, Take 1 capsule (100 mg  total) by mouth 3 (three) times daily as needed for cough., Disp: 50 capsule, Rfl: 1 .  feeding supplement, GLUCERNA SHAKE, (GLUCERNA SHAKE) LIQD, Take 237 mLs by mouth 3 (three) times daily between meals., Disp: , Rfl: 0 .  fentaNYL (DURAGESIC) 12 MCG/HR, Place 1 patch onto the skin every 3 (three) days., Disp: 10 patch, Rfl: 0 .  hydrochlorothiazide (HYDRODIURIL) 25 MG tablet, Take 0.5 tablets (12.5 mg total) by mouth daily., Disp: 30 tablet, Rfl: 0 .  HYDROmorphone (DILAUDID) 2 MG tablet, Take 1-2 tablets (2-4 mg total) by mouth every 4 (four) hours as needed for severe pain., Disp: 60 tablet, Rfl: 0 .  insulin degludec (TRESIBA FLEXTOUCH) 100 UNIT/ML SOPN FlexTouch Pen, Inject 10 Units into the skin at bedtime., Disp: , Rfl:  .  ipratropium-albuterol (DUONEB) 0.5-2.5 (3) MG/3ML SOLN, Take 3 mLs by nebulization every 6 (six) hours as needed., Disp: 360 mL, Rfl: 4 .  losartan (COZAAR) 25 MG tablet, Take 25 mg by mouth  daily., Disp: , Rfl:  .  ondansetron (ZOFRAN ODT) 4 MG disintegrating tablet, Take 1 tablet (4 mg total) by mouth every 8 (eight) hours as needed for nausea or vomiting., Disp: 20 tablet, Rfl: 0 .  polyethylene glycol (MIRALAX / GLYCOLAX) packet, Take 17 g by mouth daily for 30 days., Disp: 14 each, Rfl: 0 .  Rivaroxaban 15 & 20 MG TBPK, Take as directed on package: Start with one 37m tablet by mouth twice a day with food. On Day 22, switch to one 261mtablet once a day with food., Disp: 51 each, Rfl: 0 .  rosuvastatin (CRESTOR) 40 MG tablet, Take 40 mg by mouth daily., Disp: , Rfl: 0 .  sorbitol 70 % SOLN, Take 30 mLs by mouth daily as needed for severe constipation., Disp: 1 Bottle, Rfl: 0 .  zolpidem (AMBIEN) 10 MG tablet, Take 10 mg by mouth at bedtime. , Disp: , Rfl:    Exam: Current vital signs: BP (!) 153/90   Pulse 82   Temp 98.3 F (36.8 C) (Temporal)   Resp (!) 25   Ht 5' (1.524 m)   Wt 65 kg   SpO2 100%   BMI 27.99 kg/m  Vital signs in last 24 hours: Temp:  [98.3 F (36.8 C)] 98.3 F (36.8 C) (02/02 1229) Pulse Rate:  [82-102] 82 (02/02 1315) Resp:  [22-25] 25 (02/02 1315) BP: (153-168)/(87-90) 153/90 (02/02 1315) SpO2:  [100 %] 100 % (02/02 1315) Weight:  [65 kg] 65 kg (02/02 1229) General: Drowsy, opens eyes to noxious stimulation HEENT normocephalic atraumatic CVs: S1G2-6egular rhythm Respiratory: Chest clear to auscultation Extremities: Warm well perfused Neurological exam Mental status: Drowsy, opens eyes to noxious stimulation.  Does not follow commands initially but then started following some simple commands. Moaning, nonverbal. Cranial nerves: Pupils equal round reactive to light, extraocular movements intact, blinks to threat from both sides, face symmetric, tongue midline. Motor exam: Mild right hemiparesis on my exam but 4/5 in right upper and lower extremity but equal and strong withdrawal to noxious stimulation.  5/5 on left upper and lower  extremity. Sensory exam: Strong withdrawal in all 4 extremities Coordination cannot be tested due to mentation Gait testing was deferred. NIH stroke scale 1a Level of Conscious.: 1 1b LOC Questions: 2 1c LOC Commands: 1 2 Best Gaze: 0 3 Visual: 0 4 Facial Palsy: 0 5a Motor Arm - left: 2 5b Motor Arm - Right: 0 6a Motor Leg - Left: 2 6b Motor Leg -  Right: 0 7 Limb Ataxia: 0 8 Sensory: 0 9 Best Language: 2 10 Dysarthria: 1 11 Extinct. and Inatten.:0  TOTAL: 11   Labs I have reviewed labs in epic and the results pertinent to this consultation are: CBC    Component Value Date/Time   WBC 8.8 02/09/2018 1236   RBC 3.33 (L) 02/09/2018 1236   HGB 9.7 (L) 02/09/2018 1236   HGB 11.8 (L) 01/07/2018 1428   HCT 29.8 (L) 02/09/2018 1236   PLT 404 (H) 02/09/2018 1236   PLT 120 (L) 01/07/2018 1428   MCV 89.5 02/09/2018 1236   MCH 29.1 02/09/2018 1236   MCHC 32.6 02/09/2018 1236   RDW 15.2 02/09/2018 1236   LYMPHSABS 0.9 02/09/2018 1236   MONOABS 0.7 02/09/2018 1236   EOSABS 0.1 02/09/2018 1236   BASOSABS 0.0 02/09/2018 1236    CMP     Component Value Date/Time   NA 136 02/09/2018 1236   K 3.2 (L) 02/09/2018 1236   CL 98 02/09/2018 1236   CO2 26 02/09/2018 1236   GLUCOSE 167 (H) 02/09/2018 1236   BUN 10 02/09/2018 1236   CREATININE 0.60 02/09/2018 1240   CREATININE 1.06 (H) 01/07/2018 1428   CALCIUM 8.6 (L) 02/09/2018 1236   PROT 6.8 02/09/2018 1236   ALBUMIN 2.3 (L) 02/09/2018 1236   AST 27 02/09/2018 1236   AST 11 (L) 12/19/2017 1442   ALT 13 02/09/2018 1236   ALT 8 12/19/2017 1442   ALKPHOS 159 (H) 02/09/2018 1236   BILITOT 0.6 02/09/2018 1236   BILITOT 0.3 12/19/2017 1442   GFRNONAA >60 02/09/2018 1236   GFRNONAA 52 (L) 01/07/2018 1428   GFRAA >60 02/09/2018 1236   GFRAA >60 01/07/2018 1428    Imaging I have reviewed the images obtained: CT-scan of the brain shows multiple areas of hypodensities likely suspicious for metastases given metastatic brain  disease involving the right occipital, left cerebellum and also visible is the small basal ganglia hypodensity in the left which previously is a known met.     Assessment:  73 year old woman with recently diagnosed metastatic lung cancer with mets to the basal ganglia now presenting with unresponsiveness and difficulty following commands. Also recent PE started on rivaroxaban. Noncontrast head CT shows multiple areas of vasogenic edema consistent and concerning for more brain metastases. Given that she is on rivaroxaban, not a candidate for TPA and also she was outside of the window. Her symptoms are nonfocal, and not much in terms of cortical signs, hence not a candidate for EVT. I suspect that she had either a seizure from the metastases or had a hypoxic spell with syncope which is her cause for presentation.  Needs admission for further work-up That said, she still could have had new strokes because she is hypercoagulable but again no intervention is indicated.   Impression: Alert for seizure, status epilepticus Evaluate for new brain metastases Evaluate for stroke Evaluate for toxic metabolic encephalopathy  Recommendations: MRI of the brain with and without contrast Keppra-loaded with 1 g IV now Keppra 500 twice daily following that Dexamethasone 4 mg every 6 hours Oncological consultation tomorrow Supportive care per primary team If the MRI is concerning for stroke, then obtain vascular imaging as well Neurology will follow with you.  -- Amie Portland, MD [facility] Pager: [phone] If 7pm to 7am, please call on call as listed on AMION.

## 2018-02-09 NOTE — ED Triage Notes (Signed)
Arrived via EMS CODE STROKE - Lives at home alone last known well 0248. Daughter found patient on floor 0500. EMS reported LVO positive, left side facial droop unable to move left arm with slurred speech. CBG 183.

## 2018-02-09 NOTE — ED Notes (Signed)
Spoke with admit doctor who stated patient will be transferred to [facility] after MRI results.

## 2018-02-09 NOTE — ED Notes (Signed)
Neurologist at the bedside 

## 2018-02-09 NOTE — ED Notes (Signed)
Pt's CBG result was 163. Informed Gerald Stabs - RN.

## 2018-02-09 NOTE — ED Notes (Signed)
Doctor at bedside.

## 2018-02-09 NOTE — Progress Notes (Signed)
  Radiation Oncology         [phone] ________________________________  Name: Elizabeth Calhoun MRN: [id number]  Date: 12/23/2017  DOB: [birth year]  SIMULATION AND TREATMENT PLANNING NOTE    ICD-10-CM   1. Bone metastasis ([place]) C79.51     DIAGNOSIS: stage IV primary lung cancer    NARRATIVE:  The patient was brought to the [facility].  Identity was confirmed.  All relevant records and images related to the planned course of therapy were reviewed.  The patient freely provided informed written consent to proceed with treatment after reviewing the details related to the planned course of therapy. The consent form was witnessed and verified by the simulation staff.  Then, the patient was set-up in a stable reproducible  supine position for radiation therapy.  CT images were obtained.  Surface markings were placed.  The CT images were loaded into the planning software.  Then the target and avoidance structures were contoured.  Treatment planning then occurred.  The radiation prescription was entered and confirmed.  Then, I designed and supervised the construction of a total of 6 medically necessary complex treatment devices.  I have requested : 3D Simulation  I have requested a DVH of the following structures: heart, lungs, GTV, PTV, spinal cord, esophagus.  I have ordered:dose calc.  PLAN:  The patient will receive 35 Gy in 14 fractions directed at the left perihilar lung mass. The patient will also receive 35 gray in 14 fractions directed at the lumbar spine area.  -----------------------------------  Blair Promise, PhD, MD

## 2018-02-09 NOTE — ED Notes (Signed)
Doctor and neurologist at bedside. Spoke with 2 daughter at bedside regarding plan of care for patient.

## 2018-02-09 NOTE — ED Notes (Signed)
Report called report to rn  On 3w

## 2018-02-09 NOTE — ED Notes (Signed)
When the pt returned from mri she keeps her eyes closed mumbles when her name is called no voluntary movement

## 2018-02-09 NOTE — ED Notes (Signed)
Patient transported to MRI 

## 2018-02-09 NOTE — ED Notes (Signed)
Patient arrived to trauma room first to evaluate for possible intubation.

## 2018-02-09 NOTE — ED Notes (Signed)
Neurologist with the pts family  They are attempting to determine whether to  Continue treatment or withdraw

## 2018-02-09 NOTE — Progress Notes (Signed)
Paged MD regarding order verification, awaiting orders

## 2018-02-09 NOTE — ED Notes (Signed)
Pt in mri     pts code stroke was cancelled earlier today before I arrived

## 2018-02-09 NOTE — H&P (Addendum)
History and Physical    Elizabeth Calhoun LZJ:[id number] DOB: [birth year] DOA: 02/09/2018  PCP: Seward Carol, MD Consultants:  [name] - oncology; Kinard - rad onc Patient coming from: Home - lives with daughter   Chief Complaint: Weakness, AMS  HPI: Elizabeth Calhoun is a 73 y.o. female with medical history significant of hypertension, hyperlipidemia,type IIdiabetes mellitus,previous CVA, recently diagnosed PE on Xarelto,metastatic lung cancer(s/pfirst round ofpalliative radiation therapy) presenting with AMS.  She was found down this morning by family about 0500.  She left the hospital on 1/31 and was alert with no further fever x 48 hours.  Her only treatment to date was rads tx 1 month ago.  Once she got home, her demeanor changed and she appeared to worsen.  This AM, however, she was acutely worse.  By 0600, she was talking a little but she continued to have AMS all morning.  Family came to visit her and she didn't respond as she normally does.  They called her home health agency and were instructed to bring her to the ER.  She was unable to assist with her ADLs; had diminished motor skills bilaterally; and appeared to have left exotropia that may be new.  She is on Xarelto and so EMS was concerned about an acute bleed.  Her family reports that she really has been deteriorating since the cancer was diagnosed.    She was recently hospitalized from 1/22-31 for sepsis thought to be related to postobstructive PNA.  She received Zosyn for 8 days.  He was last seen by Dr. [name] on 1/29.  Cancer course to date: -11/15/17 - lumbar MRI with widespread marrow lesions consistent with metastatic disease. -12/04/17 - BM biopsy negative for malignancy -12/10/17 - PET scan with large LUL nodule c/w bronchogenic CA with local and skeletal metastatic disease -12/17-1/7 palliative RT to T3, lung, hip -12/19 - MRI with likely single metastatic lesion -Developed PE, on Xarelto   ED Course:  Lung cancer with  multiple recent admissions preventing outpatient treatment.  She was found down this AM.  Last known normal about 2AM.  Not moving left body - ?code stroke.  Has mets to the brain, needs MRI for further evaluation.  Given Keppra and Decadron for possible seizure.  She is aphasic which is new.  Goals of care reviewed - leaning toward DNR but it is new and appears to be aggressive.  Review of Systems: Unable to perform   Past Medical History:  Diagnosis Date  . Adenomatous colon polyp   . Cancer ([place])   . Diabetes mellitus without complication ([place])   . Hemorrhoids   . Menopause   . OA (osteoarthritis)   . Obesity   . Other and unspecified hyperlipidemia   . Spine metastasis ([place])   . Stage 4 lung cancer ([place])   . Stroke [facility])     Past Surgical History:  Procedure Laterality Date  . HYSTERECTOMY ABDOMINAL WITH SALPINGO-OOPHORECTOMY  1990  . KNEE ARTHROSCOPY Left 2009  . SHOULDER SURGERY  2005    Social History   Socioeconomic History  . Marital status: Widowed    Spouse name: Not on file  . Number of children: 3  . Years of education: Not on file  . Highest education level: Not on file  Occupational History  . Occupation: works at [employer]  . Financial resource strain: Not hard at all  . Food insecurity:    Worry: Patient refused    Inability: Patient refused  . Transportation  needs:    Medical: Patient refused    Non-medical: Patient refused  Tobacco Use  . Smoking status: Never Smoker  . Smokeless tobacco: Never Used  Substance and Sexual Activity  . Alcohol use: Not Currently    Comment: occasionally  . Drug use: No  . Sexual activity: Not on file  Lifestyle  . Physical activity:    Days per week: Patient refused    Minutes per session: Patient refused  . Stress: Only a little  Relationships  . Social connections:    Talks on phone: More than three times a week    Gets together: Not on file    Attends religious service: More than 4 times per  year    Active member of club or organization: Yes    Attends meetings of clubs or organizations: More than 4 times per year    Relationship status: Widowed  . Intimate partner violence:    Fear of current or ex partner: Patient refused    Emotionally abused: Patient refused    Physically abused: Patient refused    Forced sexual activity: Patient refused  Other Topics Concern  . Not on file  Social History Narrative  . Not on file    Allergies  Allergen Reactions  . Aspirin Other (See Comments)    Causes lethargy    Family History  Problem Relation Age of Onset  . Heart attack Father   . Heart disease Brother        1/8  . Heart disease Brother        2/8  . Heart disease Brother        3/8  . Heart disease Brother        4/8  . Heart disease Brother        5/8  . Heart disease Brother        6/8  . Cancer Brother        7/8  . Seizures Brother        8/8  . Stroke Brother        8/8  . Heart disease Sister        1/6  . Alzheimer's disease Sister        2/6  . Diabetes Sister        3/6  . Hypertension Sister        3/6  . Hypertension Sister        4/6  . Diabetes Sister        4/6  . Diabetes Daughter     Prior to Admission medications   Medication Sig Start Date End Date Taking? Authorizing Provider  amoxicillin-clavulanate (AUGMENTIN) 875-125 MG tablet Take 1 tablet by mouth every 12 (twelve) hours for 7 days. 02/07/18 02/14/18  Hosie Poisson, MD  benzonatate (TESSALON) 100 MG capsule Take 1 capsule (100 mg total) by mouth 3 (three) times daily as needed for cough. 11/28/17   Brunetta Genera, MD  feeding supplement, GLUCERNA SHAKE, (GLUCERNA SHAKE) LIQD Take 237 mLs by mouth 3 (three) times daily between meals. 02/07/18   Hosie Poisson, MD  fentaNYL (DURAGESIC) 12 MCG/HR Place 1 patch onto the skin every 3 (three) days. 01/24/18   Brunetta Genera, MD  hydrochlorothiazide (HYDRODIURIL) 25 MG tablet Take 0.5 tablets (12.5 mg total) by mouth  daily. 02/07/18   Hosie Poisson, MD  HYDROmorphone (DILAUDID) 2 MG tablet Take 1-2 tablets (2-4 mg total) by mouth every 4 (four) hours as needed for severe  pain. 12/10/17   Brunetta Genera, MD  insulin degludec (TRESIBA FLEXTOUCH) 100 UNIT/ML SOPN FlexTouch Pen Inject 10 Units into the skin at bedtime.    [provider]  ipratropium-albuterol (DUONEB) 0.5-2.5 (3) MG/3ML SOLN Take 3 mLs by nebulization every 6 (six) hours as needed. 02/07/18   Hosie Poisson, MD  losartan (COZAAR) 25 MG tablet Take 25 mg by mouth daily.    [provider]  ondansetron (ZOFRAN ODT) 4 MG disintegrating tablet Take 1 tablet (4 mg total) by mouth every 8 (eight) hours as needed for nausea or vomiting. 12/07/17   Long, Wonda Olds, MD  polyethylene glycol (MIRALAX / GLYCOLAX) packet Take 17 g by mouth daily for 30 days. 01/21/18 02/20/18  Arrien, Jimmy Picket, MD  Rivaroxaban 15 & 20 MG TBPK Take as directed on package: Start with one 15mg  tablet by mouth twice a day with food. On Day 22, switch to one 20mg  tablet once a day with food. 01/21/18   Arrien, Jimmy Picket, MD  rosuvastatin (CRESTOR) 40 MG tablet Take 40 mg by mouth daily. 10/01/16   [provider]  sorbitol 70 % SOLN Take 30 mLs by mouth daily as needed for severe constipation. 01/21/18   Arrien, Jimmy Picket, MD  zolpidem (AMBIEN) 10 MG tablet Take 10 mg by mouth at bedtime.     [provider]    Physical Exam: Vitals:   02/09/18 1229 02/09/18 1230 02/09/18 1315 02/09/18 1330  BP:  (!) 168/87 (!) 153/90 (!) 160/88  Pulse:  (!) 102 82 83  Resp:  (!) 22 (!) 25 (!) 22  Temp: 98.3 F (36.8 C)     TempSrc: Temporal     SpO2:  100% 100% 100%  Weight: 65 kg     Height: 5' (1.524 m)        . General:  Obtunded - opens eyes to voice and can answer very simple yes/no questions . Eyes: L exotropia - ?new, normal lids, iris . ENT:  grossly normal hearing, lips & tongue, mmm . Neck:  Hard C-collar in  place . Cardiovascular:  RR with mild tachycardia, no m/r/g. No LE edema.  Marland Kitchen Respiratory:   CTA bilaterally with no wheezes/rales/rhonchi.  Normal respiratory effort. . Abdomen:  soft, NT, ND, NABS . Skin:  no rash or induration seen on limited exam . Musculoskeletal:  grossly normal tone BUE/BLE, good ROM, no bony abnormality . Psychiatric: obtunded, opens eyes to voice and minimal stimulation, can answer simple yes/no questions . Neurologic: unable to perform    Radiological Exams on Admission: Ct Cervical Spine Wo Contrast  Result Date: 02/09/2018 CLINICAL DATA:  Patient found down and unresponsive. History of lung carcinoma with brain metastatic disease. EXAM: CT CERVICAL SPINE WITHOUT CONTRAST TECHNIQUE: Multidetector CT imaging of the cervical spine was performed without intravenous contrast. Multiplanar CT image reconstructions were also generated. COMPARISON:  None. FINDINGS: Alignment: Normal. Skull base and vertebrae: Destructive lesion in the right transverse process and adjacent pedicle and lamina of T1. Lytic lesion in the transverse process and pedicle on the left of T2 with a nondisplaced transverse process fracture. Nondisplaced fracture of the spinous process of T2. Lytic lesion in the T2 vertebra. There are several other lucent lesions in the cervical spine which may reflect additional areas of metastatic disease, degenerative cystic changes or a combination. Soft tissues and spinal canal: No prevertebral fluid or swelling. No canal hematoma. No spinal canal mass. No neck masses or enlarged lymph nodes. Disc levels:  Mild loss of disc height at C4-C5 with mild to moderate loss of disc height at C5-C6. Mild loss of disc height at C6-C7. Mild spondylotic disc bulging at these levels. No significant stenosis. No evidence of a disc herniation. Upper chest: Masses in the left apex consistent with the known lung carcinoma. No acute findings. Other: None. IMPRESSION: 1. Nondisplaced fractures  of the left transverse process of T2 and the spinous process of T2. These are most likely pathologic fractures and are of unclear chronicity. 2. No other fractures. 3. Metastatic destructive lesions noted in the right T1 transverse process extending to the adjacent pedicle and lamina and T2 vertebra and left T2 pedicle extending to the transverse process. Electronically Signed   By: Lajean Manes M.D.   On: 02/09/2018 13:24   Ct Head Code Stroke Wo Contrast  Result Date: 02/09/2018 CLINICAL DATA:  Code stroke.  Found down and unresponsive. EXAM: CT HEAD WITHOUT CONTRAST TECHNIQUE: Contiguous axial images were obtained from the base of the skull through the vertex without intravenous contrast. COMPARISON:  MR brain 12/26/2017. FINDINGS: Brain: No findings concerning for acute infarction or hemorrhage. No hydrocephalus or midline shift. No extra-axial fluid. There is vasogenic edema involving a large area of the RIGHT occipital lobe. There is additional vasogenic edema involving a large area of the LEFT cerebellar hemisphere. Chronic infarction affects the RIGHT cerebellum. Chronic encephalomalacia also affects the RIGHT temporal lobe, possible post treatment change. Chronic lacunar infarction of the RIGHT frontal subcortical white matter. Extensive chronic microvascular ischemic change versus post treatment effect of the BILATERAL supratentorial white matter. Vascular: Calcification of the cavernous internal carotid arteries consistent with cerebrovascular atherosclerotic disease. No signs of intracranial large vessel occlusion. Skull: No fracture. No clear metastatic disease. Sinuses/Orbits: No layering fluid. Negative orbits. Other: None. ASPECTS [facility] Stroke Program Early CT Score) - Ganglionic level infarction (caudate, lentiform nuclei, internal capsule, insula, M1-M3 cortex): 7 - Supraganglionic infarction (M4-M6 cortex): 3 Total score (0-10 with 10 being normal): 10 IMPRESSION: 1. No findings concerning  for acute infarction or intracranial hemorrhage. 2. Vasogenic edema affecting the RIGHT occipital lobe and LEFT cerebellum, concerning for new metastatic disease. MRI of the brain without and with contrast, or post infusion CT imaging of the head is recommended for further evaluation. 3. ASPECTS is 10. 4. These results were communicated to Dr. Rory Percy at 12:58 pmon 2/2/2020by text page via the [facility] messaging system. Electronically Signed   By: Staci Righter M.D.   On: 02/09/2018 13:00    EKG: Independently reviewed.  Sinus tachycardia with rate 97; nonspecific ST changes with artifact, will repeat   Labs on Admission: I have personally reviewed the available labs and imaging studies at the time of the admission.  Pertinent labs:   ABG: 7.482/37.2/99 K+ 3.2 Glucose 167 AP 159 Albumin 2.3 NH4 12 Troponin 0.01 WBC 8.8 Hgb 9.7 Platelets 404 INR 2.49  Assessment/Plan Principal Problem:   AMS (altered mental status) Active Problems:   Bone metastasis (HCC)   Pulmonary embolism (HCC)   HTN (hypertension)   HLD (hyperlipidemia)   Diabetes mellitus without complication (HCC)   Goals of care, counseling/discussion   Stage 4 lung cancer (HCC)   AMS, likely associated with metastatic lung cancer -Patient with known stage 4 lung CA that has not been effectively treated due to rapid progression of disease -She has had continued progressive weakness -She presented today with severe and marked diffuse weakness and inability to perform ADLs -Initial concern was for Code Stroke, but  CT appears to indicate progressive metastatic disease to the brain with vasogenic edema -MRI is pending -Assuming MRI does not indicate acute CVA, I have discussed the patient with Dr. Rory Percy and we are in agreement with transport to [facility] for admission there to [facility] -I spoke with Dr. [name], who is on call for RT and who preformed rad tx on her previously; he agrees with need for transfer (assuming no evidence of  ischemia on MRI) and will plan to consult and initiate XRT tomorrow -Will add Dr. [name] to the treatment team -She has received Keppra empirically for possible seizures; will continue BID -She was also given Dexamethasone for cerebral edema; will continue 4 mg q6h -She also continues to have metastatic disease of the spine which may also benefit from palliative RT -Her family is struggling with this disease, particularly its rapid progression; they are likely to benefit from palliative care consultation -Currently, the patient has requested to be DNR  PE -Will continue Xarelto for now  HTN -Hold HCTZ and Cozaar for now  DM -Recent A1c 10.4 -She is only on Tresiba 10 units daily, will continue for now -Cover with moderate-scale SSI  HLD -Continue Crestor  DVT prophylaxis: Xarelto Code Status:  DNR - confirmed with patient/family Family Communication: Daughters and other family member were present throughout evaluation  Disposition Plan:  To be determined Consults called: Rad Onc; Neurology; Oncology added to treatment team Admission status: Admit - It is my clinical opinion that admission to INPATIENT is reasonable and necessary because of the expectation that this patient will require hospital care that crosses at least 2 midnights to treat this condition based on the medical complexity of the problems presented.  Given the aforementioned information, the predictability of an adverse outcome is felt to be significant.    Karmen Bongo MD [facility]   How to contact the [facility] Attending or Consulting provider [place] or covering provider during after hours [place], for this patient?  1. Check the care team in [facility] and look for a) attending/consulting [facility] provider listed and b) the [facility] team listed 2. Log into [email or web address] and use 's universal password to access. If you do not have the password, please contact the hospital operator. 3. Locate the [facility] provider you are  looking for under [facility] and page to a number that you can be directly reached. 4. If you still have difficulty reaching the provider, please page the [facility] (Director on Call) for the Hospitalists listed on amion for assistance.   02/09/2018, 2:23 PM    Spoke to dr Rory Percy with  Neurology pt is now comfort care only.  [facility] and ordered palliative care order set.  No charge.

## 2018-02-09 NOTE — ED Provider Notes (Signed)
[facility] Provider Note   CSN: [id number] Arrival date & time: 02/09/18  1219     History   Chief Complaint Chief Complaint  Patient presents with  . Code Stroke    HPI Elizabeth Calhoun is a 73 y.o. female.  Level 5 caveat due to altered mental status.  Patient arrives as a code stroke.  Patient with known lung cancer.  Supposedly was found down by family member several hours ago.  Appeared to have left-sided weakness per EMS.  Patient arrives being bagged by EMS as it did not appear that she was breathing on her own.  Patient is on anticoagulation.  Concern for head bleed given history of possible fall.  The history is provided by the patient.  Neurologic Problem  This is a new problem. The current episode started 6 to 12 hours ago. The problem occurs constantly. The problem has not changed since onset.Nothing aggravates the symptoms.    Past Medical History:  Diagnosis Date  . Adenomatous colon polyp   . Cancer ([place])   . Diabetes mellitus without complication ([place])   . Hemorrhoids   . Menopause   . OA (osteoarthritis)   . Obesity   . Other and unspecified hyperlipidemia   . Spine metastasis ([place])   . Stage 4 lung cancer ([place])   . Stroke [facility])     Patient Active Problem List   Diagnosis Date Noted  . AMS (altered mental status) 02/09/2018  . Stage 4 lung cancer ([place])   . Spine metastasis ([place])   . Fever   . Lung cancer ([place]) 01/30/2018  . HTN (hypertension) 01/30/2018  . HLD (hyperlipidemia) 01/30/2018  . Hyponatremia 01/30/2018  . Stroke ([place]) 01/30/2018  . Sepsis ([place]) 01/30/2018  . Normocytic anemia 01/30/2018  . Diabetes mellitus without complication ([place]) 23/76/2831  . Acute metabolic encephalopathy 51/76/1607  . Palliative care by specialist   . Goals of care, counseling/discussion   . Pulmonary embolism ([place]) 01/19/2018  . Bone metastasis ([place]) 12/16/2017    Past Surgical History:  Procedure Laterality Date   . HYSTERECTOMY ABDOMINAL WITH SALPINGO-OOPHORECTOMY  1990  . KNEE ARTHROSCOPY Left 2009  . SHOULDER SURGERY  2005     OB History   No obstetric history on file.      Home Medications    Prior to Admission medications   Medication Sig Start Date End Date Taking? Authorizing Provider  acetaminophen (TYLENOL) 500 MG tablet Take 1,000 mg by mouth every 6 (six) hours as needed for mild pain.   Yes [provider]  benzonatate (TESSALON) 100 MG capsule Take 1 capsule (100 mg total) by mouth 3 (three) times daily as needed for cough. 11/28/17  Yes Brunetta Genera, MD  feeding supplement, GLUCERNA SHAKE, (GLUCERNA SHAKE) LIQD Take 237 mLs by mouth 3 (three) times daily between meals. 02/07/18  Yes Hosie Poisson, MD  fentaNYL (DURAGESIC) 12 MCG/HR Place 1 patch onto the skin every 3 (three) days. 01/24/18  Yes Brunetta Genera, MD  hydrochlorothiazide (HYDRODIURIL) 25 MG tablet Take 0.5 tablets (12.5 mg total) by mouth daily. 02/07/18  Yes Hosie Poisson, MD  HYDROmorphone (DILAUDID) 2 MG tablet Take 1-2 tablets (2-4 mg total) by mouth every 4 (four) hours as needed for severe pain. 12/10/17  Yes Brunetta Genera, MD  insulin degludec (TRESIBA FLEXTOUCH) 100 UNIT/ML SOPN FlexTouch Pen Inject 10 Units into the skin at bedtime.   Yes [provider]  ipratropium-albuterol (DUONEB) 0.5-2.5 (3) MG/3ML SOLN  Take 3 mLs by nebulization every 6 (six) hours as needed. 02/07/18  Yes Hosie Poisson, MD  losartan (COZAAR) 25 MG tablet Take 25 mg by mouth daily.   Yes [provider]  ondansetron (ZOFRAN ODT) 4 MG disintegrating tablet Take 1 tablet (4 mg total) by mouth every 8 (eight) hours as needed for nausea or vomiting. 12/07/17  Yes Long, Wonda Olds, MD  polyethylene glycol (MIRALAX / GLYCOLAX) packet Take 17 g by mouth daily for 30 days. 01/21/18 02/20/18 Yes Arrien, Jimmy Picket, MD  Rivaroxaban 15 & 20 MG TBPK Take as directed on package: Start with one 15mg  tablet by  mouth twice a day with food. On Day 22, switch to one 20mg  tablet once a day with food. 01/21/18  Yes Arrien, Jimmy Picket, MD  rosuvastatin (CRESTOR) 40 MG tablet Take 40 mg by mouth daily. 10/01/16  Yes [provider]  sorbitol 70 % SOLN Take 30 mLs by mouth daily as needed for severe constipation. 01/21/18  Yes Arrien, Jimmy Picket, MD  zolpidem (AMBIEN) 10 MG tablet Take 10 mg by mouth at bedtime.    Yes [provider]    Family History Family History  Problem Relation Age of Onset  . Heart attack Father   . Heart disease Brother        1/8  . Heart disease Brother        2/8  . Heart disease Brother        3/8  . Heart disease Brother        4/8  . Heart disease Brother        5/8  . Heart disease Brother        6/8  . Cancer Brother        7/8  . Seizures Brother        8/8  . Stroke Brother        8/8  . Heart disease Sister        1/6  . Alzheimer's disease Sister        2/6  . Diabetes Sister        3/6  . Hypertension Sister        3/6  . Hypertension Sister        4/6  . Diabetes Sister        4/6  . Diabetes Daughter     Social History Social History   Tobacco Use  . Smoking status: Never Smoker  . Smokeless tobacco: Never Used  Substance Use Topics  . Alcohol use: Not Currently    Comment: occasionally  . Drug use: No     Allergies   Aspirin   Review of Systems Review of Systems  Unable to perform ROS: Acuity of condition     Physical Exam Updated Vital Signs  ED Triage Vitals  Enc Vitals Group     BP 02/09/18 1230 (!) 168/87     Pulse Rate 02/09/18 1230 (!) 102     Resp 02/09/18 1230 (!) 22     Temp 02/09/18 1229 98.3 F (36.8 C)     Temp Source 02/09/18 1229 Temporal     SpO2 02/09/18 1230 100 %     Weight 02/09/18 1229 143 lb 4.8 oz (65 kg)     Height 02/09/18 1229 5' (1.524 m)     Head Circumference --      Peak Flow --      Pain Score --  Pain Loc --      Pain Edu? --      Excl. in [place]? --      Physical Exam Constitutional:      General: She is in acute distress.     Appearance: She is ill-appearing.  HENT:     Head: Normocephalic and atraumatic.     Nose: Nose normal.     Mouth/Throat:     Mouth: Mucous membranes are moist.  Eyes:     Pupils: Pupils are equal, round, and reactive to light.     Comments: Disconjugate gaze  Neck:     Musculoskeletal: Neck supple.  Cardiovascular:     Pulses: Normal pulses.     Heart sounds: Normal heart sounds. No murmur.  Pulmonary:     Breath sounds: Rhonchi present.     Comments: Patient breathing on her own, diminished throughout, end-tidal CO2 normal Abdominal:     General: There is no distension.  Musculoskeletal:     Right lower leg: Edema present.     Left lower leg: Edema present.  Skin:    General: Skin is warm and dry.     Capillary Refill: Capillary refill takes less than 2 seconds.     Findings: No erythema.  Neurological:     GCS: GCS eye subscore is 4. GCS verbal subscore is 1. GCS motor subscore is 6.     Motor: No abnormal muscle tone.      ED Treatments / Results  Labs (all labs ordered are listed, but only abnormal results are displayed) Labs Reviewed  PROTIME-INR - Abnormal; Notable for the following components:      Result Value   Prothrombin Time 26.6 (*)    All other components within normal limits  APTT - Abnormal; Notable for the following components:   aPTT 46 (*)    All other components within normal limits  CBC - Abnormal; Notable for the following components:   RBC 3.33 (*)    Hemoglobin 9.7 (*)    HCT 29.8 (*)    Platelets 404 (*)    All other components within normal limits  DIFFERENTIAL - Abnormal; Notable for the following components:   Abs Immature Granulocytes 0.15 (*)    All other components within normal limits  COMPREHENSIVE METABOLIC PANEL - Abnormal; Notable for the following components:   Potassium 3.2 (*)    Glucose, Bld 167 (*)    Calcium 8.6 (*)    Albumin 2.3 (*)     Alkaline Phosphatase 159 (*)    All other components within normal limits  URINALYSIS, ROUTINE W REFLEX MICROSCOPIC - Abnormal; Notable for the following components:   APPearance HAZY (*)    Glucose, UA 50 (*)    Ketones, ur 5 (*)    Protein, ur 100 (*)    Bacteria, UA RARE (*)    All other components within normal limits  CBG MONITORING, ED - Abnormal; Notable for the following components:   Glucose-Capillary 142 (*)    All other components within normal limits  POCT I-STAT 7, (LYTES, BLD GAS, ICA,H+H) - Abnormal; Notable for the following components:   pH, Arterial 7.482 (*)    Acid-Base Excess 4.0 (*)    Potassium 3.2 (*)    HCT 26.0 (*)    Hemoglobin 8.8 (*)    All other components within normal limits  CBG MONITORING, ED - Abnormal; Notable for the following components:   Glucose-Capillary 155 (*)    All other components within  normal limits  URINE CULTURE  AMMONIA  I-STAT TROPONIN, ED  I-STAT CREATININE, ED    EKG EKG Interpretation  Date/Time:  Sunday February 09 2018 12:48:12 EST Ventricular Rate:  97 PR Interval:    QRS Duration: 78 QT Interval:  364 QTC Calculation: 472 R Axis:   10 Text Interpretation:  Sinus tachycardia with irregular rate Abnormal R-wave progression, early transition Confirmed by Lennice Sites [phone]) on 02/09/2018 1:49:33 PM   Radiology Ct Cervical Spine Wo Contrast  Result Date: 02/09/2018 CLINICAL DATA:  Patient found down and unresponsive. History of lung carcinoma with brain metastatic disease. EXAM: CT CERVICAL SPINE WITHOUT CONTRAST TECHNIQUE: Multidetector CT imaging of the cervical spine was performed without intravenous contrast. Multiplanar CT image reconstructions were also generated. COMPARISON:  None. FINDINGS: Alignment: Normal. Skull base and vertebrae: Destructive lesion in the right transverse process and adjacent pedicle and lamina of T1. Lytic lesion in the transverse process and pedicle on the left of T2 with a  nondisplaced transverse process fracture. Nondisplaced fracture of the spinous process of T2. Lytic lesion in the T2 vertebra. There are several other lucent lesions in the cervical spine which may reflect additional areas of metastatic disease, degenerative cystic changes or a combination. Soft tissues and spinal canal: No prevertebral fluid or swelling. No canal hematoma. No spinal canal mass. No neck masses or enlarged lymph nodes. Disc levels: Mild loss of disc height at C4-C5 with mild to moderate loss of disc height at C5-C6. Mild loss of disc height at C6-C7. Mild spondylotic disc bulging at these levels. No significant stenosis. No evidence of a disc herniation. Upper chest: Masses in the left apex consistent with the known lung carcinoma. No acute findings. Other: None. IMPRESSION: 1. Nondisplaced fractures of the left transverse process of T2 and the spinous process of T2. These are most likely pathologic fractures and are of unclear chronicity. 2. No other fractures. 3. Metastatic destructive lesions noted in the right T1 transverse process extending to the adjacent pedicle and lamina and T2 vertebra and left T2 pedicle extending to the transverse process. Electronically Signed   By: Lajean Manes M.D.   On: 02/09/2018 13:24   Dg Chest Portable 1 View  Result Date: 02/09/2018 CLINICAL DATA:  Patient found down at 5 a.m. this morning. EXAM: PORTABLE CHEST 1 VIEW COMPARISON:  PA and lateral chest 02/01/2018. CT chest 02/05/2018 and 01/30/2018. FINDINGS: The lungs are emphysematous. Masslike opacity in the left mid lung is unchanged. Asymmetric opacity in the left apex relative to the to the right lung apex correlates with the patient's known left apical mass. Right lung is clear. Right basilar airspace disease and small right effusion seen on the most recent examination have improved. There is cardiomegaly. Atherosclerosis is seen. No acute or focal bony abnormality. IMPRESSION: Improved right basilar  airspace disease and small effusion since the most recent examination. No change in a masslike density projecting in the left mid lung presumably due to infection as it is not seen on the patient's 01/30/2018 CT scan. Left apical mass lesion consistent with the patient's known carcinoma. Electronically Signed   By: Inge Rise M.D.   On: 02/09/2018 14:31   Ct Head Code Stroke Wo Contrast  Result Date: 02/09/2018 CLINICAL DATA:  Code stroke.  Found down and unresponsive. EXAM: CT HEAD WITHOUT CONTRAST TECHNIQUE: Contiguous axial images were obtained from the base of the skull through the vertex without intravenous contrast. COMPARISON:  MR brain 12/26/2017. FINDINGS: Brain: No findings concerning  for acute infarction or hemorrhage. No hydrocephalus or midline shift. No extra-axial fluid. There is vasogenic edema involving a large area of the RIGHT occipital lobe. There is additional vasogenic edema involving a large area of the LEFT cerebellar hemisphere. Chronic infarction affects the RIGHT cerebellum. Chronic encephalomalacia also affects the RIGHT temporal lobe, possible post treatment change. Chronic lacunar infarction of the RIGHT frontal subcortical white matter. Extensive chronic microvascular ischemic change versus post treatment effect of the BILATERAL supratentorial white matter. Vascular: Calcification of the cavernous internal carotid arteries consistent with cerebrovascular atherosclerotic disease. No signs of intracranial large vessel occlusion. Skull: No fracture. No clear metastatic disease. Sinuses/Orbits: No layering fluid. Negative orbits. Other: None. ASPECTS [facility] Stroke Program Early CT Score) - Ganglionic level infarction (caudate, lentiform nuclei, internal capsule, insula, M1-M3 cortex): 7 - Supraganglionic infarction (M4-M6 cortex): 3 Total score (0-10 with 10 being normal): 10 IMPRESSION: 1. No findings concerning for acute infarction or intracranial hemorrhage. 2. Vasogenic  edema affecting the RIGHT occipital lobe and LEFT cerebellum, concerning for new metastatic disease. MRI of the brain without and with contrast, or post infusion CT imaging of the head is recommended for further evaluation. 3. ASPECTS is 10. 4. These results were communicated to Dr. Rory Percy at 12:58 pmon 2/2/2020by text page via the [facility] messaging system. Electronically Signed   By: Staci Righter M.D.   On: 02/09/2018 13:00    Procedures .Critical Care Performed by: Lennice Sites, DO Authorized by: Lennice Sites, DO   Critical care provider statement:    Critical care time (minutes):  45   Critical care time was exclusive of:  Teaching time and separately billable procedures and treating other patients   Critical care was necessary to treat or prevent imminent or life-threatening deterioration of the following conditions:  CNS failure or compromise   Critical care was time spent personally by me on the following activities:  Blood draw for specimens, development of treatment plan with patient or surrogate, discussions with primary provider, evaluation of patient's response to treatment, examination of patient, obtaining history from patient or surrogate, ordering and performing treatments and interventions, ordering and review of laboratory studies, ordering and review of radiographic studies, pulse oximetry, re-evaluation of patient's condition and review of old charts   I assumed direction of critical care for this patient from another provider in my specialty: no     (including critical care time)  Medications Ordered in ED Medications  dexamethasone (DECADRON) injection 4 mg (has no administration in time range)  levETIRAcetam (KEPPRA) IVPB 1000 mg/100 mL premix (has no administration in time range)  fentaNYL (DURAGESIC) 12 MCG/HR 1 patch (has no administration in time range)  HYDROmorphone (DILAUDID) injection 0.5-1 mg (has no administration in time range)  rosuvastatin (CRESTOR) tablet  40 mg (has no administration in time range)  insulin degludec (TRESIBA) 100 UNIT/ML FlexTouch Pen 10 Units (has no administration in time range)  Rivaroxaban TBPK 15 mg (has no administration in time range)  ipratropium-albuterol (DUONEB) 0.5-2.5 (3) MG/3ML nebulizer solution 3 mL (has no administration in time range)  insulin aspart (novoLOG) injection 0-15 Units (has no administration in time range)  0.9 %  sodium chloride infusion (has no administration in time range)  acetaminophen (TYLENOL) tablet 650 mg (has no administration in time range)    Or  acetaminophen (TYLENOL) solution 650 mg (has no administration in time range)    Or  acetaminophen (TYLENOL) suppository 650 mg (has no administration in time range)  senna-docusate (Senokot-S) tablet 1  tablet (has no administration in time range)  insulin aspart (novoLOG) injection 0-5 Units (has no administration in time range)  sodium chloride flush (NS) 0.9 % injection 3 mL (3 mLs Intravenous Given 02/09/18 1304)  levETIRAcetam (KEPPRA) IVPB 1000 mg/100 mL premix (0 mg Intravenous Stopped 02/09/18 1335)  dexamethasone (DECADRON) injection 4 mg (4 mg Intravenous Given 02/09/18 1314)     Initial Impression / Assessment and Plan / ED Course  I have reviewed the triage vital signs and the nursing notes.  Pertinent labs & imaging results that were available during my care of the patient were reviewed by me and considered in my medical decision making (see chart for details).     Akilah Cureton is a 73 year old female with history of lung cancer metastasis to the bone and brain who presents to the ED with altered mental status.  Patient was a code stroke in the field.  She arrives hypertensive but otherwise with normal vitals.  Patient was being bagged by EMS upon arrival however upon my evaluation patient appears to follow commands, breathing on her own, aphasic, opens her eyes spontaneously. GCS11.  She appears to be protecting her airway.  She  went to CT scan with neurology and CT scan was concerning for increasing metastasis to the brain.  Most likely concern for seizures/postictal process.  There is no bleeding.  Neurology recommends IV Keppra, Decadron, MRI for further evaluation.  Altered mental status work-up was initiated also with lab work including chest x-ray, urine studies.  Blood gas reassuring.  No acidosis.  No hypercarbia.  EKG showed sinus rhythm, no ischemic changes.  Troponin within normal limits.  No significant electrolyte abnormality, kidney injury, leukocytosis.  Chest x-ray of the chest showed overall improvement of fluid.  No obvious pneumonia.  Persistent left apical mass lesion.  Patient remained hemodynamically stable throughout my care.  Mental status was stable as patient is able to follow commands but is unable to speak.  She moves all of her extremities.  Care goals were discussed with family members at length and at this time they would like for patient to be DNR and this was placed.    Hospitalist was consulted for admission.  MRI was ordered for further evaluation for metastasis versus stroke.  Patient also had CT scan of the neck that showed some pathologic fractures of the transverse process of T2 and spinous process of T2.  Neurosurgery recommends further work-up including PET scan.  No need for any restrictions right now is these are likely pathologic.  No need for cervical collar.  Remained hemodynamically stable throughout my care and to be admitted to hospitalist service for further care. Suspect brain mets causing seizure today vs possible stroke  This chart was dictated using voice recognition software.  Despite best efforts to proofread,  errors can occur which can change the documentation meaning.    Final Clinical Impressions(s) / ED Diagnoses   Final diagnoses:  Altered mental status, unspecified altered mental status type  Brain metastases ([place])  Metastasis to brain of unknown origin [facility])     ED Discharge Orders    None       Lennice Sites, DO 02/09/18 1601

## 2018-02-09 NOTE — ED Notes (Signed)
Admit Doctor spoke with family regarding possible transfer to [facility].

## 2018-02-09 NOTE — ED Notes (Signed)
Daughter arrived to ED wanting to speak with EDP. Doctor notified.

## 2018-02-09 NOTE — Progress Notes (Signed)
Received pt to the unit, family at bedside.

## 2018-02-09 NOTE — Code Documentation (Addendum)
Per family she was LKW at 0248 this am.  At 0500 am she was found on the floor after a fall.  Per family she was OK this morning then she progressively worsened this am.  Patient arrived via EMS as Code Stroke.  Bagging pt upon arrival.  Pt assess by EDP able to awake patient, she will follow simple commands and is moving all extremities.  Placed on 2l [place].  Multiple attempts at IV start unsuccessful.  Transported to Radiology for head CT.  Dr Carloyn Jaeger at bedside to assess patient.  Concern for metastatic lesions vs CVA, plan MRI.  Decacron and Keppra given.  Family at bedside. No acute interventions.  Hand off with Marya Amsler ED RN

## 2018-02-09 NOTE — ED Notes (Signed)
Admit Doctor at bedside.  

## 2018-02-09 NOTE — ED Notes (Signed)
The pt lies with both eyes closed is nodding and shaking her head sometimes to questions asked

## 2018-02-09 NOTE — Progress Notes (Signed)
MRI brain shows scattered posterior circulation stroke. MRA brain shows basilar occlusion past the ICU. Discussed these findings with the family in detail. Explained the poor prognosis given these findings. Multiple family members at bedside including daughters and niece. They chose to go with comfort measures only as that would be her wishes. Spoke with the hospitalist Dr. Shanon Brow who has placed comfort measure orders as well as change the admission orders to the palliative floor. Neurology will be available as needed. Please call with questions.  -- Amie Portland, MD [facility] Pager: [phone] If 7pm to 7am, please call on call as listed on AMION.

## 2018-02-09 NOTE — ED Notes (Signed)
Unsuccessful attempt to call report  rn to call me back 

## 2018-02-10 ENCOUNTER — Telehealth: Payer: Self-pay | Admitting: Hematology

## 2018-02-10 DIAGNOSIS — I2699 Other pulmonary embolism without acute cor pulmonale: Secondary | ICD-10-CM

## 2018-02-10 DIAGNOSIS — E785 Hyperlipidemia, unspecified: Secondary | ICD-10-CM

## 2018-02-10 DIAGNOSIS — I639 Cerebral infarction, unspecified: Secondary | ICD-10-CM

## 2018-02-10 LAB — CULTURE, BLOOD (ROUTINE X 2)
Culture: NO GROWTH
Culture: NO GROWTH
Special Requests: ADEQUATE

## 2018-02-10 LAB — URINE CULTURE: Culture: NO GROWTH

## 2018-02-10 LAB — GLUCOSE, CAPILLARY: Glucose-Capillary: 107 mg/dL — ABNORMAL HIGH (ref 70–99)

## 2018-02-10 MED ORDER — HYDROMORPHONE HCL 2 MG PO TABS: 2.0000 mg | ORAL_TABLET | ORAL | 0 refills | Status: AC | PRN

## 2018-02-10 MED ORDER — SENNOSIDES-DOCUSATE SODIUM 8.6-50 MG PO TABS: 1.0000 | ORAL_TABLET | Freq: Every evening | ORAL | 1 refills | Status: AC | PRN

## 2018-02-10 MED ORDER — HALOPERIDOL 0.5 MG PO TABS: 0.5000 mg | ORAL_TABLET | ORAL | 0 refills | Status: AC | PRN

## 2018-02-10 MED ORDER — MORPHINE SULFATE 20 MG/5ML PO SOLN: 5.0000 mg | ORAL | 0 refills | Status: AC | PRN

## 2018-02-10 MED ORDER — LORAZEPAM 1 MG PO TABS: 1.0000 mg | ORAL_TABLET | ORAL | 0 refills | Status: AC | PRN

## 2018-02-10 MED ORDER — FENTANYL 12 MCG/HR TD PT72: 1.0000 | MEDICATED_PATCH | TRANSDERMAL | 0 refills | Status: AC

## 2018-02-10 NOTE — Progress Notes (Signed)
Nutrition Brief Note  Chart reviewed. Pt with poor prognosis. Pt currently on comfort measures. No nutrition interventions warranted at this time.  Please re-consult as needed.   Corrin Parker, MS, RD, LDN Pager # [phone] After hours/ weekend pager # [phone]

## 2018-02-10 NOTE — Telephone Encounter (Signed)
Called patient per 2/3 sch message - left message for patient to call back to r./s

## 2018-02-10 NOTE — Evaluation (Signed)
SLP Cancellation Note  Patient Details Name: Elizabeth Calhoun MRN: [id number] DOB: Jun 11, 1945   Cancelled treatment:       Reason Eval/Treat Not Completed: Other (comment)(pt comfort care only, will sign off)   Macario Golds 02/10/2018, 7:59 AM  Luanna Salk, [place] SLP [place] Pager [phone] Office [phone]

## 2018-02-10 NOTE — Consult Note (Signed)
   [facility] CM Inpatient Consult   02/10/2018  Laiya Wisby [birth year] [id number]   Patient chart has been reviewed for readmissions less than 30 days and for high risk score 38% for unplanned readmissions.    Per chart review, patient orders in place for comfort measures. No [facility] needs.  Netta Cedars, MSN, [facility] Liaison Nurse Mobile Phone [phone]  Toll free office [phone]

## 2018-02-10 NOTE — Discharge Summary (Signed)
Physician Discharge Summary  Elizabeth Calhoun WCB:[id number] DOB: 1946/01/08 DOA: 01/29/2018  PCP: Seward Carol, MD  Admit date: 01/29/2018 Discharge date:1 /30/2020  Admitted From: Home Disposition: Home  Recommendations for Outpatient Follow-up:  1. Follow up with PCP in 1-2 weeks 2. Please obtain BMP/CBC in one week Please follow up with oncology as recommended.   Discharge Condition:STABLE.  CODE STATUS:DNR  Diet recommendation: Heart Healthy Brief/Interim Summary: Elizabeth Calhoun a 73 y.o.femalewith medical history significant ofhypertension, hyperlipidemia,type IIdiabetes mellitus,previous CVA, recently diagnosed PE on Xarelto,metastatic lung cancer(s/pfirst round ofpalliative radiation therapy), whopresents with generalized weakness, poor appetite, fever and chills, mild confusion.Admitted for sepsis of unclear etiology. Treated with IV antibiotics empirically. Hospital course complicated by persistent recurrent feversdespite empiric treatment for HCAP.ID consulted and following.  Independently reviewed her CT chest which revealed known lung mass left apical. and bilateral nodular densities. Lactic acid, procalcitonin, WBC, blood cultures,unremarkable. Influenza a and B negative. Respiratory viral panelnegative. Possible radiation pneumonitis? discussed with radiation oncology Dr. [name] and Dr Colletta Maryland not convinced of this.  Fevers have resolved.  Pt discharged on oral antibiotics as per ID to complete the course for post obstructive pneumonia.   Discharge Diagnoses:  Principal Problem:   Sepsis ([place]) Active Problems:   Bone metastasis ([place])   Pulmonary embolism ([facility])   Lung cancer ([facility])   HTN (hypertension)   HLD (hyperlipidemia)   Hyponatremia   Stroke ([place])   Normocytic anemia   Diabetes mellitus without complication ([place])   Acute metabolic encephalopathy   Palliative care by specialist   Goals of care, counseling/discussion    Fever  Sepsis probably secondary to postobstructive pneumonia Patient on admission had fever, tachycardia was  tachypneic and hypotensive which have all resolved except for the fever. Blood cultures have been negative so far.  pt received 9 days of IV antibiotics and she is being discharged on oral antibiotics to complete the course.  ID consulted and recommendations given. Repeat CXR showed worsening haziness on the right, probably pulm edema as pt is on IV fluids and appears slightly fluid overloaded.  One dose of lasix ordered and her breathing has improved.  She will be discharged on 2lit of  oxygen.   Discussed with Dr Lamonte Sakai and reviewed the CXR and CT scan of the chest independently, suggested there are no signs of radiation pneumonitis.  Recommending continue the antibiotics.   History of left lung cancer with bone mets. Follows up with Dr. [name].    Hyponatremia Probably secondary to hydrochlorothiazide.  It appears to have resolved.   Hypertension Well-controlled.   History of pulmonary embolism On Xarelto.  History of stroke/ 5 mm lesion in the brain patient is on Xarelto and Crestor at this time   Moderate protein calorie malnutrition Nutrition consulted continue with Glucerna 3 times daily.   Acute metabolic encephalopathy Resolved Obstipation Continue with bowel regimen with Senokot med MiraLAX daily.  Type 2 diabetes mellitus with hyperglycemia Well-controlled    Anemia of chronic disease Hemoglobin around 8 .  Transfuse to keep hemoglobin greater than 7. No obvious signs of bleeding.      Discharge Instructions  Discharge Instructions    Diet - low sodium heart healthy   Complete by:  As directed    Discharge instructions   Complete by:  As directed    Follow up with Dr [name] as recommended.     Allergies as of 02/07/2018      Reactions   Aspirin Other (See Comments)   Causes lethargy  Medication List     TAKE these medications   benzonatate 100 MG capsule Commonly known as:  TESSALON Take 1 capsule (100 mg total) by mouth 3 (three) times daily as needed for cough.   feeding supplement (GLUCERNA SHAKE) Liqd Take 237 mLs by mouth 3 (three) times daily between meals.   fentaNYL 12 MCG/HR Commonly known as:  [place]on 1 patch onto the skin every 3 (three) days.   hydrochlorothiazide 25 MG tablet Commonly known as:  HYDRODIURIL Take 0.5 tablets (12.5 mg total) by mouth daily. What changed:  how much to take   HYDROmorphone 2 MG tablet Commonly known as:  DILAUDID Take 1-2 tablets (2-4 mg total) by mouth every 4 (four) hours as needed for severe pain.   ipratropium-albuterol 0.5-2.5 (3) MG/3ML Soln Commonly known as:  DUONEB Take 3 mLs by nebulization every 6 (six) hours as needed.   losartan 25 MG tablet Commonly known as:  COZAAR Take 25 mg by mouth daily.   ondansetron 4 MG disintegrating tablet Commonly known as:  ZOFRAN ODT Take 1 tablet (4 mg total) by mouth every 8 (eight) hours as needed for nausea or vomiting.   polyethylene glycol packet Commonly known as:  MIRALAX / GLYCOLAX Take 17 g by mouth daily for 30 days.   Rivaroxaban 15 & 20 MG Tbpk Take as directed on package: Start with one 15mg  tablet by mouth twice a day with food. On Day 22, switch to one 20mg  tablet once a day with food.   rosuvastatin 40 MG tablet Commonly known as:  CRESTOR Take 40 mg by mouth daily.   sorbitol 70 % Soln Take 30 mLs by mouth daily as needed for severe constipation.   TRESIBA FLEXTOUCH 100 UNIT/ML Sopn FlexTouch Pen Generic drug:  insulin degludec Inject 10 Units into the skin at bedtime.   zolpidem 10 MG tablet Commonly known as:  AMBIEN Take 10 mg by mouth at bedtime.      Follow-up Information    Seward Carol, MD. Schedule an appointment as soon as possible for a visit in 1 week(s).   Specialty:  Internal Medicine Contact information: [address] [place] [address] [phone]        Brunetta Genera, MD Follow up.   Specialties:  Hematology, Oncology Contact information: [address] [place]  [address] [phone]          Allergies  Allergen Reactions  . Aspirin Other (See Comments)    Causes lethargy    Consultations:  ID  Oncology.   Procedures/Studies: Dg Chest 2 View  Result Date: 02/05/2018 CLINICAL DATA:  Fever, lung cancer EXAM: CHEST - 2 VIEW COMPARISON:  02/01/2018 Correlation: CT chest 01/30/2018 FINDINGS: Enlargement of cardiac silhouette. Atherosclerotic calcification aorta. Mediastinal contours and pulmonary vascularity normal. BILATERAL pulmonary infiltrates consistent with pneumonia. This includes a focal area of opacity in LEFT mid lung similar to the previous exam. RIGHT lung infiltrates increased since previous study and now with small RIGHT pleural effusion. Medial LEFT apical neoplasm identified by previous imaging is not well visualized by this study. No pneumothorax. Bones demineralized with multilevel endplate spur formation thoracic spine. IMPRESSION: COPD changes with BILATERAL pulmonary infiltrates consistent with pneumonia, progressive in lower RIGHT lung since previous exam and now associated with a small RIGHT pleural effusion. Known LEFT apical neoplasm, poorly visualized. Electronically Signed   By: Lavonia Dana M.D.   On: 02/05/2018 14:12   Dg Chest 2 View  Result Date:  01/29/2018 CLINICAL DATA:  Loss of appetite. Stage IV lung cancer. EXAM: CHEST - 2 VIEW COMPARISON:  01/19/2018 CT FINDINGS: Low lung volumes. Borderline cardiomegaly with aortic atherosclerosis. Known spiculated AP window nodal mass as seen on prior CT measuring up to 2.3 cm radiographically likely accounts for the pulmonary opacity seen in the region of the AP window. Central vascular congestion is otherwise noted bilaterally. No aggressive osseous lesions. Osteoarthritis of the [facility]  and glenohumeral joints. IMPRESSION: Low lung volumes with borderline cardiomegaly and aortic atherosclerosis. Mild central vascular congestion is noted. Spiculated masslike opacity in the AP window compatible with known nodal mass on recent CT. Electronically Signed   By: Ashley Royalty M.D.   On: 01/29/2018 22:29   Dg Chest 2 View  Result Date: 01/19/2018 CLINICAL DATA:  Shortness of breath. EXAM: CHEST - 2 VIEW COMPARISON:  PET scan of December 10, 2017. Radiographs of December 07, 2017. FINDINGS: Stable cardiac silhouette. Left hilar spiculated abnormality is noted consistent with malignancy as described on PET scan. No pneumothorax is noted. No pleural effusion is noted. Elevated left hemidiaphragm is noted. Right lung is clear. No acute consolidative process is noted. Bony thorax is unremarkable. IMPRESSION: Stable left hilar spiculated density is noted consistent with malignancy as described on PET scan. No significant changes noted compared to prior exam. Electronically Signed   By: Marijo Conception, M.D.   On: 01/19/2018 14:02   Ct Angio Chest Pe W And/or Wo Contrast  Result Date: 01/30/2018 CLINICAL DATA:  73 year old female with failure to thrive. EXAM: CT ANGIOGRAPHY CHEST WITH CONTRAST TECHNIQUE: Multidetector CT imaging of the chest was performed using the standard protocol during bolus administration of intravenous contrast. Multiplanar CT image reconstructions and MIPs were obtained to evaluate the vascular anatomy. CONTRAST:  81mL ISOVUE-370 IOPAMIDOL (ISOVUE-370) INJECTION 76% COMPARISON:  Chest radiograph dated 01/29/2018 and CT dated 01/19/2018 FINDINGS: Cardiovascular: There is mild cardiomegaly. No pericardial effusion. There is multi vessel coronary vascular calcification. Mild atherosclerotic calcification of the thoracic aorta. No aneurysmal dilatation or evidence of dissection. There is mild dilatation of the main pulmonary trunk suggestive of a degree of pulmonary hypertension. Clinical  correlation is recommended. Residual embolus noted in the right lower lobe segmental branch pulmonary artery as well as in the right upper lobe segmental branch. No new pulmonary artery embolus identified. There is high-grade narrowing of the left main pulmonary artery secondary to hilar mass/metastasis. Mediastinum/Nodes: Spiculated left hilar/suprahilar mass/metastasis measuring 3.8 x 3.0 cm similar to prior CT. There is encasement and narrowing of the left main pulmonary artery. There is diffuse thickening of the left mediastinal pleural surface. Right paratracheal lymph node measures 9 mm short axis. No mediastinal fluid collection. Lungs/Pleura: A 2.7 x 2.6 cm left apical spiculated mass as well as smaller adjacent nodular densities, likely satellite lesions. There is diffuse ground-glass airspace density and nodularity right greater left, new or worsened since the prior CT. The rapid progression since the prior CT suggest an acute etiology such as edema or pneumonia. Lymphangitic spread of tumor is considered less likely given rapid interval development. Clinical correlation and follow-up recommended. No large pleural effusion. There is no pneumothorax. The central airways are patent. Upper Abdomen: No acute abnormality. Musculoskeletal: Osteopenia with degenerative changes of the spine. Diffuse sclerotic lesions throughout the spine most consistent with metastatic disease. There is a lytic lesion involving the anterior half of T11 consistent with metastatic disease. Additional lytic metastatic disease involving the left scapula as seen previously. No acute pathologic  fracture. Review of the MIP images confirms the above findings. IMPRESSION: 1. Interval development of bilateral ground-glass and nodular density most consistent with edema or pneumonia. Clinical correlation is recommended. 2. Left apical malignancy with nodal and osseous metastatic disease as seen previously. 3. Encasement and high-grade  narrowing of the left main pulmonary artery by the hilar mass/metastatic node. 4. Residual pulmonary artery emboli similar to prior CT.  No new PE. Electronically Signed   By: Anner Crete M.D.   On: 01/30/2018 02:24   Ct Angio Chest Pe W/cm &/or Wo Cm  Result Date: 01/19/2018 CLINICAL DATA:  Worsening shortness of breath over the past few days. Recently diagnosed with metastatic lung cancer. EXAM: CT ANGIOGRAPHY CHEST WITH CONTRAST TECHNIQUE: Multidetector CT imaging of the chest was performed using the standard protocol during bolus administration of intravenous contrast. Multiplanar CT image reconstructions and MIPs were obtained to evaluate the vascular anatomy. CONTRAST:  58mL ISOVUE-370 IOPAMIDOL (ISOVUE-370) INJECTION 76% COMPARISON:  PET-CT dated December 10, 2017. CT chest dated December 07, 2017. FINDINGS: Cardiovascular: Acute segmental pulmonary emboli within the right upper and lower lobes. No central or lobar pulmonary emboli. Elevated RV/LV ratio of 1.6. No pericardial effusion. No thoracic aortic aneurysm or dissection. Coronary, aortic arch, and branch vessel atherosclerotic vascular disease. Mediastinum/Nodes: Unchanged aortopulmonary window nodal mass measuring 3.8 x 4.0 cm, this again completely encases the left pulmonary artery and left upper lobe bronchus. Other scattered mediastinal lymph nodes measuring up to 1 cm in short axis are unchanged. Stable to slightly decreased left hilar lymph node measuring 1.3 cm, previously 1.5 cm. No axillary right hilar lymphadenopathy. The thyroid gland, trachea, and esophagus demonstrate no significant findings. Lungs/Pleura: Unchanged 3.1 cm mass in the left apical of lobe. Additional scattered nodules in the left upper lobe are also unchanged. Stable multifocal pleural nodularity in the left hemithorax. No pleural effusion or pneumothorax. Upper Abdomen: No acute abnormality. Musculoskeletal: Unchanged lytic metastases involving the left scapula,  left T2 pedicle and transverse process, and T11 vertebral body. Review of the MIP images confirms the above findings. IMPRESSION: 1. Acute segmental pulmonary emboli within the right upper and lower lobes. Evidence of right heart strain. 2. Unchanged 3.1 cm left apical lung mass with left upper lobe, left pleural, nodal, and osseous metastatic disease. The 4.0 cm aortopulmonary window nodal mass severely narrows the left pulmonary artery and left upper lobe bronchus. 3. Aortic atherosclerosis (ICD10-I70.0). Critical Value/emergent results were called by telephone at the time of interpretation on 01/19/2018 at 5:17 pm to Dr. Sherry Ruffing, who verbally acknowledged these results. Electronically Signed   By: Titus Dubin M.D.   On: 01/19/2018 17:26   Ct Cervical Spine Wo Contrast  Result Date: 02/09/2018 CLINICAL DATA:  Patient found down and unresponsive. History of lung carcinoma with brain metastatic disease. EXAM: CT CERVICAL SPINE WITHOUT CONTRAST TECHNIQUE: Multidetector CT imaging of the cervical spine was performed without intravenous contrast. Multiplanar CT image reconstructions were also generated. COMPARISON:  None. FINDINGS: Alignment: Normal. Skull base and vertebrae: Destructive lesion in the right transverse process and adjacent pedicle and lamina of T1. Lytic lesion in the transverse process and pedicle on the left of T2 with a nondisplaced transverse process fracture. Nondisplaced fracture of the spinous process of T2. Lytic lesion in the T2 vertebra. There are several other lucent lesions in the cervical spine which may reflect additional areas of metastatic disease, degenerative cystic changes or a combination. Soft tissues and spinal canal: No prevertebral fluid or swelling. No canal  hematoma. No spinal canal mass. No neck masses or enlarged lymph nodes. Disc levels: Mild loss of disc height at C4-C5 with mild to moderate loss of disc height at C5-C6. Mild loss of disc height at C6-C7. Mild  spondylotic disc bulging at these levels. No significant stenosis. No evidence of a disc herniation. Upper chest: Masses in the left apex consistent with the known lung carcinoma. No acute findings. Other: None. IMPRESSION: 1. Nondisplaced fractures of the left transverse process of T2 and the spinous process of T2. These are most likely pathologic fractures and are of unclear chronicity. 2. No other fractures. 3. Metastatic destructive lesions noted in the right T1 transverse process extending to the adjacent pedicle and lamina and T2 vertebra and left T2 pedicle extending to the transverse process. Electronically Signed   By: Lajean Manes M.D.   On: 02/09/2018 13:24   Mr Jeri Cos And Wo Contrast  Result Date: 02/09/2018 CLINICAL DATA:  72 year old female found down, unresponsive. History of lung cancer with early metastatic disease to the brain suspected in December. EXAM: MRI HEAD WITHOUT AND WITH CONTRAST TECHNIQUE: Multiplanar, multiecho pulse sequences of the brain and surrounding structures were obtained without and with intravenous contrast. CONTRAST:  7 milliliters Gadavist COMPARISON:  Head MRA today reported separately. Head CT without contrast earlier today. Brain MRI 12/26/2017 FINDINGS: Brain: There is confluent restricted diffusion in the right PCA territory, bilateral superior cerebellar artery territories (greater on the left, series 5, image 37) and left thalamus (thalamus striate artery territory). Associated cytotoxic edema in those areas, but no acute hemorrhage or mass effect. There may be some restricted diffusion in the right midbrain today on series 5, image 30, but otherwise the brainstem is acutely spared (previous left pontine infarct). No anterior circulation restricted diffusion identified. Although imaging labeled postcontrast was obtained there is no contrast identified (no enhancement of the nasal mucosa). Patchy in conflict cerebral white matter T2 and FLAIR hyperintensity  appears stable since December. No areas are specific for vasogenic edema today. No ventriculomegaly or intracranial mass effect. Vascular: The mid basilar artery flow void has been lost since December on series 9, image 16. The distal vertebral and other Major intracranial vascular flow voids are preserved. Skull and upper cervical spine: Accidentally sagittal T1 weighted imaging was not performed. Sinuses/Orbits: Stable, negative. Other: Mastoids are clear. IMPRESSION: 1. Acute Basilar Artery Occlusion on MRI/MRA. Occluded basilar beyond the AICAs, with acute bilateral SCA and right greater than left PCA territory infarcts (fetal type left PCA is spared except for left thalamostriate small-vessel ischemia). 2. Cytotoxic edema associated with #1 but no associated hemorrhage or mass effect. 3. Although IV contrast was reportedly administered, there is no contrast apparent on these images. Salient findings discussed by telephone with Neurology Dr. Rory Percy at 5:10 pm on 02/09/2018. Electronically Signed   By: Genevie Ann M.D.   On: 02/09/2018 17:11   Dg Chest Portable 1 View  Result Date: 02/09/2018 CLINICAL DATA:  Patient found down at 5 a.m. this morning. EXAM: PORTABLE CHEST 1 VIEW COMPARISON:  PA and lateral chest 02/01/2018. CT chest 02/05/2018 and 01/30/2018. FINDINGS: The lungs are emphysematous. Masslike opacity in the left mid lung is unchanged. Asymmetric opacity in the left apex relative to the to the right lung apex correlates with the patient's known left apical mass. Right lung is clear. Right basilar airspace disease and small right effusion seen on the most recent examination have improved. There is cardiomegaly. Atherosclerosis is seen. No acute or focal  bony abnormality. IMPRESSION: Improved right basilar airspace disease and small effusion since the most recent examination. No change in a masslike density projecting in the left mid lung presumably due to infection as it is not seen on the patient's  01/30/2018 CT scan. Left apical mass lesion consistent with the patient's known carcinoma. Electronically Signed   By: Inge Rise M.D.   On: 02/09/2018 14:31   Dg Chest Port 1 View  Result Date: 02/01/2018 CLINICAL DATA:  Stage IV primary lung cancer. Hypoxia. EXAM: PORTABLE CHEST 1 VIEW COMPARISON:  Chest x-ray dated 01/29/2018. Chest CT dated 01/30/2018. FINDINGS: New rounded opacity within the LEFT perihilar lung measures 4 cm greatest dimension. Increased interstitial markings noted within the RIGHT perihilar lung. No pleural effusion or pneumothorax seen. Heart size is stable. Osseous structures about the chest are unremarkable. IMPRESSION: 1. New rounded opacity within the LEFT perihilar lung, measuring 4 cm greatest dimension. The rapid development suggests pneumonia, possibly postobstructive pneumonia given the perihilar mass/metastasis demonstrated on recent chest CT of 01/30/2018 with associated encasement of the LEFT main pulmonary artery. This new opacity is less likely asymmetric pulmonary edema. 2. Increased interstitial markings within the RIGHT perihilar lung, corresponding to the ground-glass opacities described on recent CT suggesting edema or atypical pneumonia. 3. LEFT hilar opacity, compatible with the hilar mass/metastatic lymph node described on recent chest CT report. Electronically Signed   By: Franki Cabot M.D.   On: 02/01/2018 10:24   Mr Jodene Nam Head Wo Contrast  Addendum Date: 02/09/2018   ADDENDUM REPORT: 02/09/2018 17:12 ADDENDUM: Basilar Artery Occlusion was discussed by telephone with Neurology Dr. Rory Percy at 5:10 pm on 02/09/2018. Electronically Signed   By: Genevie Ann M.D.   On: 02/09/2018 17:12   Result Date: 02/09/2018 CLINICAL DATA:  73 year old female found down, unresponsive. History of lung cancer with early metastatic disease to the brain suspected in December. EXAM: MRA HEAD WITHOUT CONTRAST TECHNIQUE: Angiographic images of the Circle of Willis were obtained using MRA  technique without intravenous contrast. COMPARISON:  MRI brain today reported separately. FINDINGS: Antegrade flow in the distal vertebral arteries, but both distal vertebral arteries are irregular. There is mild to moderate stenosis of the distal left V4. Basilar artery occlusion at the mid basilar artery level, just beyond patent AICA origins. No flow signal in the distal basilar or basilar tip. There is a fetal type left PCA origin with preserved left PCA flow signal, but absent bilateral SCA and right PCA flow. Antegrade flow in both ICA siphons. Both siphons are irregular compatible with atherosclerosis, but there is only mild stenosis in the supraclinoid right ICA. Patent carotid termini. Patent MCA and ACA origins. Dominant left ACA. Severe stenosis in the left A2 segment but preserved distal left ACA flow signal. The right A1 *SCRATCHED* at the right A2 is diminutive and/or demonstrates poor flow. Left MCA M1 segment and bifurcation are patent. Visible left MCA branches are patent but irregular. Right MCA M1 segment and bifurcation are patent. Visible right MCA branches are patent but irregular. IMPRESSION: 1. 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 Basilar clues and distal to the AICA origins concordant with the infarct pattern on MRI today (fetal type left PCA origin preserves that territory). 2. Superimposed moderate to severe intracranial atherosclerosis with hemodynamically significant stenosis of the dominant left A2, and bilateral MCA branch irregularity. Electronically Signed: By: Genevie Ann M.D. On: 02/09/2018 17:06   Ct Head Code Stroke Wo Contrast  Result Date: 02/09/2018 CLINICAL DATA:  Code stroke.  Found down and unresponsive. EXAM: CT HEAD WITHOUT CONTRAST TECHNIQUE: Contiguous axial images were obtained from the base of the skull through the vertex without intravenous contrast. COMPARISON:  MR brain 12/26/2017. FINDINGS: Brain: No findings concerning for acute infarction or  hemorrhage. No hydrocephalus or midline shift. No extra-axial fluid. There is vasogenic edema involving a large area of the RIGHT occipital lobe. There is additional vasogenic edema involving a large area of the LEFT cerebellar hemisphere. Chronic infarction affects the RIGHT cerebellum. Chronic encephalomalacia also affects the RIGHT temporal lobe, possible post treatment change. Chronic lacunar infarction of the RIGHT frontal subcortical white matter. Extensive chronic microvascular ischemic change versus post treatment effect of the BILATERAL supratentorial white matter. Vascular: Calcification of the cavernous internal carotid arteries consistent with cerebrovascular atherosclerotic disease. No signs of intracranial large vessel occlusion. Skull: No fracture. No clear metastatic disease. Sinuses/Orbits: No layering fluid. Negative orbits. Other: None. ASPECTS [facility] Stroke Program Early CT Score) - Ganglionic level infarction (caudate, lentiform nuclei, internal capsule, insula, M1-M3 cortex): 7 - Supraganglionic infarction (M4-M6 cortex): 3 Total score (0-10 with 10 being normal): 10 IMPRESSION: 1. No findings concerning for acute infarction or intracranial hemorrhage. 2. Vasogenic edema affecting the RIGHT occipital lobe and LEFT cerebellum, concerning for new metastatic disease. MRI of the brain without and with contrast, or post infusion CT imaging of the head is recommended for further evaluation. 3. ASPECTS is 10. 4. These results were communicated to Dr. Rory Percy at 12:58 pmon 2/2/2020by text page via the [facility] messaging system. Electronically Signed   By: Staci Righter M.D.   On: 02/09/2018 13:00       Subjective: No new complaints.   Discharge Exam: Vitals:   02/07/18 0800 02/07/18 0903  BP:    Pulse:    Resp:    Temp: 98.6 F (37 C)   SpO2:  100%   Vitals:   02/06/18 2113 02/07/18 0614 02/07/18 0800 02/07/18 0903  BP:  (!) 142/74    Pulse:  74    Resp:  19    Temp: 98.5 F (36.9  C) 98.3 F (36.8 C) 98.6 F (37 C)   TempSrc: Oral Oral Oral   SpO2:  100%  100%  Weight:      Height:        General: Pt is alert, awake, not in acute distress Cardiovascular: RRR, S1/S2 +, no rubs, no gallops Respiratory: CTA bilaterally, no wheezing, no rhonchi Abdominal: Soft, NT, ND, bowel sounds + Extremities: no edema, no cyanosis    The results of significant diagnostics from this hospitalization (including imaging, microbiology, ancillary and laboratory) are listed below for reference.     Microbiology: Recent Results (from the past 240 hour(s))  Culture, blood (routine x 2)     Status: None   Collection Time: 01/31/18  5:18 PM  Result Value Ref Range Status   Specimen Description   Final    BLOOD LEFT ARM Performed at [facility], [place] [address] [place], [place] [address]    Special Requests   Final    BOTTLES DRAWN AEROBIC ONLY Blood Culture adequate volume Performed at [place] [address], [place], [place] [address]    Culture   Final    NO GROWTH 5 DAYS Performed at [facility], [place] [address] [place], [place] [address]    Report Status 02/06/2018 FINAL  Final  Culture, blood (routine x 2)     Status: None  Collection Time: 01/31/18  5:18 PM  Result Value Ref Range Status   Specimen Description   Final    BLOOD RIGHT ARM Performed at [place] [address] [place], [place] [address]    Special Requests   Final    BOTTLES DRAWN AEROBIC ONLY Blood Culture results may not be optimal due to an inadequate volume of blood received in culture bottles Performed at [place] [address], [place], [place] [address]    Culture   Final    NO GROWTH 5 DAYS Performed at [facility], [place] [address] [place], [place] [address]    Report Status 02/06/2018 FINAL  Final  Culture, blood (Routine X 2) w Reflex to ID Panel     Status: None   Collection  Time: 02/05/18 11:25 AM  Result Value Ref Range Status   Specimen Description   Final    BLOOD LEFT ARM Performed at [place] [address] [place], [place] [address]    Special Requests   Final    BOTTLES DRAWN AEROBIC ONLY Blood Culture results may not be optimal due to an inadequate volume of blood received in culture bottles Performed at [place] [address] [place], [place] [address]    Culture   Final    NO GROWTH 5 DAYS Performed at [facility], [place] [address], [place], [place] [address]    Report Status 02/10/2018 FINAL  Final  Culture, blood (Routine X 2) w Reflex to ID Panel     Status: None   Collection Time: 02/05/18 11:43 AM  Result Value Ref Range Status   Specimen Description   Final    BLOOD LEFT HAND Performed at [place] [address] [place], [place] [address]    Special Requests   Final    BOTTLES DRAWN AEROBIC AND ANAEROBIC Blood Culture adequate volume Performed at [place] [address], [place], [place] [address]    Culture   Final    NO GROWTH 5 DAYS Performed at [facility], [place] [address], [place], [place] [address]    Report Status 02/10/2018 FINAL  Final     Labs: BNP (last 3 results) Recent Labs    01/19/18 1439 01/30/18 0545  BNP 49.2 24.5   Basic Metabolic Panel: Recent Labs  Lab 02/04/18 0431 02/05/18 0359 02/07/18 0823 02/09/18 1235 02/09/18 1236 02/09/18 1240  NA 135 139 136 135 136  --   K 3.9 3.6 3.6 3.2* 3.2*  --   CL 107 112* 104  --  98  --   CO2 19* 21* 23  --  26  --   GLUCOSE 154* 91 250*  --  167*  --   BUN 12 10 17   --  10  --   CREATININE 0.86 0.91 1.01*  --  0.87 0.60  CALCIUM 7.9* 7.9* 8.6*  --  8.6*  --    Liver Function Tests: Recent Labs  Lab 02/09/18 1236  AST 27  ALT 13  ALKPHOS 159*  BILITOT 0.6  PROT 6.8  ALBUMIN 2.3*   No results for input(s): LIPASE, AMYLASE in the last 168  hours. Recent Labs  Lab 02/09/18 1248  AMMONIA 12   CBC: Recent Labs  Lab 02/04/18 0431 02/04/18 0707 02/05/18 0359 02/07/18 0823 02/09/18 1235 02/09/18 1236  WBC 5.7  --  6.1 8.3  --  8.8  NEUTROABS  --   --   --  7.0  --  7.0  HGB 7.7* 7.8* 8.1* 8.5* 8.8* 9.7*  HCT 25.2* 25.3* 26.9* 27.4* 26.0* 29.8*  MCV 94.4  --  95.7 93.5  --  89.5  PLT 301  --  340 403*  --  404*   Cardiac Enzymes: No results for input(s): CKTOTAL, CKMB, CKMBINDEX, TROPONINI in the last 168 hours. BNP: Invalid input(s): POCBNP CBG: Recent Labs  Lab 02/07/18 0728 02/07/18 1132 02/09/18 1222 02/09/18 1326 02/09/18 1724  GLUCAP 239* 227* 155* 142* 163*   D-Dimer No results for input(s): DDIMER in the last 72 hours. Hgb A1c No results for input(s): HGBA1C in the last 72 hours. Lipid Profile No results for input(s): CHOL, HDL, LDLCALC, TRIG, CHOLHDL, LDLDIRECT in the last 72 hours. Thyroid function studies No results for input(s): TSH, T4TOTAL, T3FREE, THYROIDAB in the last 72 hours.  Invalid input(s): FREET3 Anemia work up No results for input(s): VITAMINB12, FOLATE, FERRITIN, TIBC, IRON, RETICCTPCT in the last 72 hours. Urinalysis    Component Value Date/Time   COLORURINE YELLOW 02/09/2018 1427   APPEARANCEUR HAZY (A) 02/09/2018 1427   LABSPEC 1.020 02/09/2018 1427   PHURINE 6.0 02/09/2018 1427   GLUCOSEU 50 (A) 02/09/2018 1427   HGBUR NEGATIVE 02/09/2018 1427   BILIRUBINUR NEGATIVE 02/09/2018 1427   KETONESUR 5 (A) 02/09/2018 1427   PROTEINUR 100 (A) 02/09/2018 1427   UROBILINOGEN 0.2 01/10/2010 1749   NITRITE NEGATIVE 02/09/2018 1427   LEUKOCYTESUR NEGATIVE 02/09/2018 1427   Sepsis Labs Invalid input(s): PROCALCITONIN,  WBC,  LACTICIDVEN Microbiology Recent Results (from the past 240 hour(s))  Culture, blood (routine x 2)     Status: None   Collection Time: 01/31/18  5:18 PM  Result Value Ref Range Status   Specimen Description   Final    BLOOD LEFT ARM Performed at [place] [address] [place], [place] [address]    Special Requests   Final    BOTTLES DRAWN AEROBIC ONLY Blood Culture adequate volume Performed at [place] [address], [place], [place] [address]    Culture   Final    NO GROWTH 5 DAYS Performed at [facility], [place] [address] [place], [place] [address]    Report Status 02/06/2018 FINAL  Final  Culture, blood (routine x 2)     Status: None   Collection Time: 01/31/18  5:18 PM  Result Value Ref Range Status   Specimen Description   Final    BLOOD RIGHT ARM Performed at [place] [address], [place], [place] [address]    Special Requests   Final    BOTTLES DRAWN AEROBIC ONLY Blood Culture results may not be optimal due to an inadequate volume of blood received in culture bottles Performed at [place] [address], [place], [place] [address]    Culture   Final    NO GROWTH 5 DAYS Performed at [facility], [place] [address] [place], [place] [address]    Report Status 02/06/2018 FINAL  Final  Culture, blood (Routine X 2) w Reflex to ID Panel     Status: None   Collection Time: 02/05/18 11:25 AM  Result Value Ref Range Status   Specimen Description   Final    BLOOD LEFT ARM Performed at [place] [address], [place], [place] [address]    Special Requests   Final  BOTTLES DRAWN AEROBIC ONLY Blood Culture results may not be optimal due to an inadequate volume of blood received in culture bottles Performed at [facility], [place] [address] [place], [place] [address]    Culture   Final    NO GROWTH 5 DAYS Performed at [facility], [place] [address] [place], [place] [address]    Report Status 02/10/2018 FINAL  Final  Culture, blood (Routine X 2) w Reflex to ID Panel     Status: None   Collection Time: 02/05/18 11:43 AM  Result Value Ref Range Status   Specimen  Description   Final    BLOOD LEFT HAND Performed at [place] [address] [place], [place] [address]    Special Requests   Final    BOTTLES DRAWN AEROBIC AND ANAEROBIC Blood Culture adequate volume Performed at [place] [address], [place], [place] [address]    Culture   Final    NO GROWTH 5 DAYS Performed at [facility], [place] [address], [place], [place] [address]    Report Status 02/10/2018 FINAL  Final     Time coordinating discharge:36 minutes  SIGNED:   Hosie Poisson, MD  [facility] 02/10/2018, 9:26 AM Pager   If 7PM-7AM, please contact night-coverage [email or web address] Password [email or web address]

## 2018-02-10 NOTE — Progress Notes (Signed)
OT Cancellation Note  Patient Details Name: Elizabeth Calhoun MRN: [id number] DOB: 25-Nov-1945   Cancelled Treatment:    Reason Eval/Treat Not Completed: Active bedrest order;Other (comment)(comfort care per RN).  Will sign off, please re-consult as needed.    Delight Stare, OT Acute Rehabilitation Services Pager [phone] Office [phone]    Delight Stare 02/10/2018, 8:16 AM

## 2018-02-10 NOTE — Progress Notes (Signed)
[facility]ospice and [facility]) Hospital Liaison.   Notified by Kathlee Nations, [place] that family is interested in information on home hospice services verses residential hospice at [facility]. Met with daughter [name] and family to provide education. Family states they need to discuss options with other family members before making a decision.   [facility] liaison will follow up with family on 02/11/2018.   Please call with any hospice related questions.   Thank you, Farrel Gordon, RN, [place] [facility] Hospital Liaison (listed on [place]) [phone]

## 2018-02-10 NOTE — Progress Notes (Signed)
PT Cancellation Note  Patient Details Name: Elizabeth Calhoun MRN: [id number] DOB: 1945/12/24   Cancelled Treatment:    Reason Eval/Treat Not Completed: Other (comment)(Per neurology note, pt with poor prognosis, family electing to move to comfort measures at this time. PT signing off. )  8:25 AM, 02/10/18 Etta Grandchild, PT, DPT Physical Therapist - [place] [phone] (Pager)  [phone] (Office)    Buccola,Allan C 02/10/2018, 8:25 AM

## 2018-02-10 NOTE — Progress Notes (Signed)
PROGRESS NOTE    Elizabeth Calhoun  JME:[id number] DOB: 1945/03/24 DOA: 02/09/2018 PCP: Seward Carol, MD   Brief Narrative:  73 year old with history of essential hypertension, diabetes mellitus type 2, hyperlipidemia, CVA, pulmonary embolism on Xarelto, metastatic lung cancer on palliative radiation presented to the ER with altered mental status.  MRI of the brain during the hospitalization showed scattered posterior circulation stroke with basilar occlusion.  Neurology recommended poor prognosis and comfort measures.  Case was discussed with the family who agreed with this.   Assessment & Plan:   Principal Problem:   AMS (altered mental status) Active Problems:   Bone metastasis (HCC)   Pulmonary embolism (HCC)   HTN (hypertension)   HLD (hyperlipidemia)   Stroke (HCC)   Diabetes mellitus without complication ([name])   Goals of care, counseling/discussion   Stage 4 lung cancer ([name])   CVA (cerebral vascular accident) ([name])  Acute metabolic encephalopathy secondary to acute CVA, nonhemorrhagic Scattered posterior circulation CVA with basilar occlusion - Recommend comfort care at this time.  Comfort care orders have been placed.  I had an extensive discussion with the patient's daughter who agrees with this.  All the questions have been answered.  We will consult hospice care team.  Stage IV lung cancer with metastases - Comfort care.  Diabetes mellitus type 2 without complication - Hold off on Accu-Cheks.  Hold long-acting insulin.  Family does not want POC is to be done.  Essential hypertension -Hold off on home p.o. medications.  Hyperlipidemia -Hold off on p.o. medications  Pulmonary embolism - Hold home p.o. Xarelto.  Patient is comfort care at this time.  Had an extensive discussion with the patient's daughter.  Hospice care team is aware.  We will see the patient today and hopefully we can transition patient to residential hospice.  Consultants:    Neurology  Hospice  Procedures:   None  Antimicrobials:   None   Subjective: Patient is nonverbal laying in the bed with eyes closed.  She is verbally not responsive.  Minimal response to physical stimuli. Daughter at the bedside, had an extensive discussion with her.  She agrees with transitioning patient to residential hospice.  Review of Systems Unable to obtain full review of system due to her mentation  Objective: Vitals:   02/09/18 1900 02/09/18 1930 02/09/18 2000 02/09/18 2039  BP: (!) 172/97 (!) 164/88 (!) 180/88 (!) 189/87  Pulse: 98 80 95 89  Resp: (!) 24 (!) 21 (!) 21 14  Temp:    99 F (37.2 C)  TempSrc:    Oral  SpO2: 100% 100% 100% 100%  Weight:      Height:        Intake/Output Summary (Last 24 hours) at 02/10/2018 1154 Last data filed at 02/09/2018 1430 Gross per 24 hour  Intake -  Output 375 ml  Net -375 ml   Filed Weights   02/09/18 1229  Weight: 65 kg    Examination:  General exam: Minimally responsive to physical stimuli, no response to verbal stimuli Respiratory system: Diminished breath sounds due to poor respiratory efforts Cardiovascular system: S1 & S2 heard, RRR. No JVD, murmurs, rubs, gallops or clicks. No pedal edema. Gastrointestinal system: Abdomen is nondistended, soft and nontender. No organomegaly or masses felt. Normal bowel sounds heard. Central nervous system: Unable to fully assess Extremities: No contractures noted Skin: No rashes, lesions or ulcers Psychiatry: Unable to assess    Data Reviewed:   CBC: Recent Labs  Lab 02/04/18 0431 02/04/18 0707  02/05/18 0359 02/07/18 0823 02/09/18 1235 02/09/18 1236  WBC 5.7  --  6.1 8.3  --  8.8  NEUTROABS  --   --   --  7.0  --  7.0  HGB 7.7* 7.8* 8.1* 8.5* 8.8* 9.7*  HCT 25.2* 25.3* 26.9* 27.4* 26.0* 29.8*  MCV 94.4  --  95.7 93.5  --  89.5  PLT 301  --  340 403*  --  440*   Basic Metabolic Panel: Recent Labs  Lab 02/04/18 0431 02/05/18 0359 02/07/18 0823  02/09/18 1235 02/09/18 1236 02/09/18 1240  NA 135 139 136 135 136  --   K 3.9 3.6 3.6 3.2* 3.2*  --   CL 107 112* 104  --  98  --   CO2 19* 21* 23  --  26  --   GLUCOSE 154* 91 250*  --  167*  --   BUN 12 10 17   --  10  --   CREATININE 0.86 0.91 1.01*  --  0.87 0.60  CALCIUM 7.9* 7.9* 8.6*  --  8.6*  --    GFR: Estimated Creatinine Clearance: 52.7 mL/min (by C-G formula based on SCr of 0.6 mg/dL). Liver Function Tests: Recent Labs  Lab 02/09/18 1236  AST 27  ALT 13  ALKPHOS 159*  BILITOT 0.6  PROT 6.8  ALBUMIN 2.3*   No results for input(s): LIPASE, AMYLASE in the last 168 hours. Recent Labs  Lab 02/09/18 1248  AMMONIA 12   Coagulation Profile: Recent Labs  Lab 02/09/18 1236  INR 2.49   Cardiac Enzymes: No results for input(s): CKTOTAL, CKMB, CKMBINDEX, TROPONINI in the last 168 hours. BNP (last 3 results) No results for input(s): PROBNP in the last 8760 hours. HbA1C: No results for input(s): HGBA1C in the last 72 hours. CBG: Recent Labs  Lab 02/07/18 0728 02/07/18 1132 02/09/18 1222 02/09/18 1326 02/09/18 1724  GLUCAP 239* 227* 155* 142* 163*   Lipid Profile: No results for input(s): CHOL, HDL, LDLCALC, TRIG, CHOLHDL, LDLDIRECT in the last 72 hours. Thyroid Function Tests: No results for input(s): TSH, T4TOTAL, FREET4, T3FREE, THYROIDAB in the last 72 hours. Anemia Panel: No results for input(s): VITAMINB12, FOLATE, FERRITIN, TIBC, IRON, RETICCTPCT in the last 72 hours. Sepsis Labs: No results for input(s): PROCALCITON, LATICACIDVEN in the last 168 hours.  Recent Results (from the past 240 hour(s))  Culture, blood (routine x 2)     Status: None   Collection Time: 01/31/18  5:18 PM  Result Value Ref Range Status   Specimen Description   Final    BLOOD LEFT ARM Performed at [facility] [address], [place], [place] [address]    Special Requests   Final    BOTTLES DRAWN AEROBIC ONLY Blood Culture adequate volume Performed  at [facility] [address] [place], [place] [address]    Culture   Final    NO GROWTH 5 DAYS Performed at [facility], [place] [address] [place], [place] [address]    Report Status 02/06/2018 FINAL  Final  Culture, blood (routine x 2)     Status: None   Collection Time: 01/31/18  5:18 PM  Result Value Ref Range Status   Specimen Description   Final    BLOOD RIGHT ARM Performed at [facility] [address] [place], [place] [address]    Special Requests   Final    BOTTLES DRAWN AEROBIC ONLY Blood Culture results may not be optimal  due to an inadequate volume of blood received in culture bottles Performed at [facility] [address], [place], [place] [address]    Culture   Final    NO GROWTH 5 DAYS Performed at [facility], [place] [address], [place], [place] [address]    Report Status 02/06/2018 FINAL  Final  Culture, blood (Routine X 2) w Reflex to ID Panel     Status: None   Collection Time: 02/05/18 11:25 AM  Result Value Ref Range Status   Specimen Description   Final    BLOOD LEFT ARM Performed at [facility] [address] [place], [place] [address]    Special Requests   Final    BOTTLES DRAWN AEROBIC ONLY Blood Culture results may not be optimal due to an inadequate volume of blood received in culture bottles Performed at [facility] [address] [place], [place] [address]    Culture   Final    NO GROWTH 5 DAYS Performed at [facility], [place] [address], [place], [place] [address]    Report Status 02/10/2018 FINAL  Final  Culture, blood (Routine X 2) w Reflex to ID Panel     Status: None   Collection Time: 02/05/18 11:43 AM  Result Value Ref Range Status   Specimen Description   Final    BLOOD LEFT HAND Performed at [facility] [address] [place], [place] [address]    Special Requests   Final    BOTTLES DRAWN  AEROBIC AND ANAEROBIC Blood Culture adequate volume Performed at [facility] [address], [place],  [address]    Culture   Final    NO GROWTH 5 DAYS Performed at [facility], [place] [address], [place],  [address]    Report Status 02/10/2018 FINAL  Final  Urine culture     Status: None   Collection Time: 02/09/18  2:27 PM  Result Value Ref Range Status   Specimen Description URINE, RANDOM  Final   Special Requests   Final    NONE Performed at [facility], [place] [address], [place],  [address]    Culture NO GROWTH  Final   Report Status 02/10/2018 FINAL  Final         Radiology Studies: Ct Cervical Spine Wo Contrast  Result Date: 02/09/2018 CLINICAL DATA:  Patient found down and unresponsive. History of lung carcinoma with brain metastatic disease. EXAM: CT CERVICAL SPINE WITHOUT CONTRAST TECHNIQUE: Multidetector CT imaging of the cervical spine was performed without intravenous contrast. Multiplanar CT image reconstructions were also generated. COMPARISON:  None. FINDINGS: Alignment: Normal. Skull base and vertebrae: Destructive lesion in the right transverse process and adjacent pedicle and lamina of T1. Lytic lesion in the transverse process and pedicle on the left of T2 with a nondisplaced transverse process fracture. Nondisplaced fracture of the spinous process of T2. Lytic lesion in the T2 vertebra. There are several other lucent lesions in the cervical spine which may reflect additional areas of metastatic disease, degenerative cystic changes or a combination. Soft tissues and spinal canal: No prevertebral fluid or swelling. No canal hematoma. No spinal canal mass. No neck masses or enlarged lymph nodes. Disc levels: Mild loss of disc height at C4-C5 with mild to moderate loss of disc height at C5-C6. Mild loss of disc height at C6-C7. Mild spondylotic disc bulging at these levels. No significant stenosis. No evidence of  a disc  herniation. Upper chest: Masses in the left apex consistent with the known lung carcinoma. No acute findings. Other: None. IMPRESSION: 1. Nondisplaced fractures of the left transverse process of T2 and the spinous process of T2. These are most likely pathologic fractures and are of unclear chronicity. 2. No other fractures. 3. Metastatic destructive lesions noted in the right T1 transverse process extending to the adjacent pedicle and lamina and T2 vertebra and left T2 pedicle extending to the transverse process. Electronically Signed   By: Lajean Manes M.D.   On: 02/09/2018 13:24   Mr Jeri Cos And Wo Contrast  Result Date: 02/09/2018 CLINICAL DATA:  73 year old female found down, unresponsive. History of lung cancer with early metastatic disease to the brain suspected in December. EXAM: MRI HEAD WITHOUT AND WITH CONTRAST TECHNIQUE: Multiplanar, multiecho pulse sequences of the brain and surrounding structures were obtained without and with intravenous contrast. CONTRAST:  7 milliliters Gadavist COMPARISON:  Head MRA today reported separately. Head CT without contrast earlier today. Brain MRI 12/26/2017 FINDINGS: Brain: There is confluent restricted diffusion in the right PCA territory, bilateral superior cerebellar artery territories (greater on the left, series 5, image 37) and left thalamus (thalamus striate artery territory). Associated cytotoxic edema in those areas, but no acute hemorrhage or mass effect. There may be some restricted diffusion in the right midbrain today on series 5, image 30, but otherwise the brainstem is acutely spared (previous left pontine infarct). No anterior circulation restricted diffusion identified. Although imaging labeled postcontrast was obtained there is no contrast identified (no enhancement of the nasal mucosa). Patchy in conflict cerebral white matter T2 and FLAIR hyperintensity appears stable since December. No areas are specific for vasogenic edema today. No  ventriculomegaly or intracranial mass effect. Vascular: The mid basilar artery flow void has been lost since December on series 9, image 16. The distal vertebral and other Major intracranial vascular flow voids are preserved. Skull and upper cervical spine: Accidentally sagittal T1 weighted imaging was not performed. Sinuses/Orbits: Stable, negative. Other: Mastoids are clear. IMPRESSION: 1. Acute Basilar Artery Occlusion on MRI/MRA. Occluded basilar beyond the AICAs, with acute bilateral SCA and right greater than left PCA territory infarcts (fetal type left PCA is spared except for left thalamostriate small-vessel ischemia). 2. Cytotoxic edema associated with #1 but no associated hemorrhage or mass effect. 3. Although IV contrast was reportedly administered, there is no contrast apparent on these images. Salient findings discussed by telephone with Neurology Dr. Rory Percy at 5:10 pm on 02/09/2018. Electronically Signed   By: Genevie Ann M.D.   On: 02/09/2018 17:11   Dg Chest Portable 1 View  Result Date: 02/09/2018 CLINICAL DATA:  Patient found down at 5 a.m. this morning. EXAM: PORTABLE CHEST 1 VIEW COMPARISON:  PA and lateral chest 02/01/2018. CT chest 02/05/2018 and 01/30/2018. FINDINGS: The lungs are emphysematous. Masslike opacity in the left mid lung is unchanged. Asymmetric opacity in the left apex relative to the to the right lung apex correlates with the patient's known left apical mass. Right lung is clear. Right basilar airspace disease and small right effusion seen on the most recent examination have improved. There is cardiomegaly. Atherosclerosis is seen. No acute or focal bony abnormality. IMPRESSION: Improved right basilar airspace disease and small effusion since the most recent examination. No change in a masslike density projecting in the left mid lung presumably due to infection as it is not seen on the patient's 01/30/2018 CT scan. Left apical mass lesion consistent with the patient's  known  carcinoma. Electronically Signed   By: Inge Rise M.D.   On: 02/09/2018 14:31   Mr Jodene Nam Head Wo Contrast  Addendum Date: 02/09/2018   ADDENDUM REPORT: 02/09/2018 17:12 ADDENDUM: Basilar Artery Occlusion was discussed by telephone with Neurology Dr. Rory Percy at 5:10 pm on 02/09/2018. Electronically Signed   By: Genevie Ann M.D.   On: 02/09/2018 17:12   Result Date: 02/09/2018 CLINICAL DATA:  73 year old female found down, unresponsive. History of lung cancer with early metastatic disease to the brain suspected in December. EXAM: MRA HEAD WITHOUT CONTRAST TECHNIQUE: Angiographic images of the Circle of Willis were obtained using MRA technique without intravenous contrast. COMPARISON:  MRI brain today reported separately. FINDINGS: Antegrade flow in the distal vertebral arteries, but both distal vertebral arteries are irregular. There is mild to moderate stenosis of the distal left V4. Basilar artery occlusion at the mid basilar artery level, just beyond patent AICA origins. No flow signal in the distal basilar or basilar tip. There is a fetal type left PCA origin with preserved left PCA flow signal, but absent bilateral SCA and right PCA flow. Antegrade flow in both ICA siphons. Both siphons are irregular compatible with atherosclerosis, but there is only mild stenosis in the supraclinoid right ICA. Patent carotid termini. Patent MCA and ACA origins. Dominant left ACA. Severe stenosis in the left A2 segment but preserved distal left ACA flow signal. The right A1 *SCRATCHED* at the right A2 is diminutive and/or demonstrates poor flow. Left MCA M1 segment and bifurcation are patent. Visible left MCA branches are patent but irregular. Right MCA M1 segment and bifurcation are patent. Visible right MCA branches are patent but irregular. IMPRESSION: 1. 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 Basilar clues and distal to the AICA origins concordant with the infarct pattern on MRI today (fetal type left PCA  origin preserves that territory). 2. Superimposed moderate to severe intracranial atherosclerosis with hemodynamically significant stenosis of the dominant left A2, and bilateral MCA branch irregularity. Electronically Signed: By: Genevie Ann M.D. On: 02/09/2018 17:06   Ct Head Code Stroke Wo Contrast  Result Date: 02/09/2018 CLINICAL DATA:  Code stroke.  Found down and unresponsive. EXAM: CT HEAD WITHOUT CONTRAST TECHNIQUE: Contiguous axial images were obtained from the base of the skull through the vertex without intravenous contrast. COMPARISON:  MR brain 12/26/2017. FINDINGS: Brain: No findings concerning for acute infarction or hemorrhage. No hydrocephalus or midline shift. No extra-axial fluid. There is vasogenic edema involving a large area of the RIGHT occipital lobe. There is additional vasogenic edema involving a large area of the LEFT cerebellar hemisphere. Chronic infarction affects the RIGHT cerebellum. Chronic encephalomalacia also affects the RIGHT temporal lobe, possible post treatment change. Chronic lacunar infarction of the RIGHT frontal subcortical white matter. Extensive chronic microvascular ischemic change versus post treatment effect of the BILATERAL supratentorial white matter. Vascular: Calcification of the cavernous internal carotid arteries consistent with cerebrovascular atherosclerotic disease. No signs of intracranial large vessel occlusion. Skull: No fracture. No clear metastatic disease. Sinuses/Orbits: No layering fluid. Negative orbits. Other: None. ASPECTS [facility] Stroke Program Early CT Score) - Ganglionic level infarction (caudate, lentiform nuclei, internal capsule, insula, M1-M3 cortex): 7 - Supraganglionic infarction (M4-M6 cortex): 3 Total score (0-10 with 10 being normal): 10 IMPRESSION: 1. No findings concerning for acute infarction or intracranial hemorrhage. 2. Vasogenic edema affecting the RIGHT occipital lobe and LEFT cerebellum, concerning for new metastatic disease.  MRI of the brain without and with contrast, or post infusion  CT imaging of the head is recommended for further evaluation. 3. ASPECTS is 10. 4. These results were communicated to Dr. Rory Percy at 12:58 pmon 2/2/2020by text page via the [facility] messaging system. Electronically Signed   By: Staci Righter M.D.   On: 02/09/2018 13:00        Scheduled Meds: . dexamethasone  4 mg Intravenous Q6H  . fentaNYL  1 patch Transdermal Q72H  . insulin aspart  0-15 Units Subcutaneous TID WC  . insulin aspart  0-5 Units Subcutaneous QHS   Continuous Infusions: . sodium chloride    . levETIRAcetam       LOS: 1 day   Time spent= 35 mins     Arsenio Loader, MD [facility]  If 7PM-7AM, please contact night-coverage [email or web address] 02/10/2018, 11:54 AM

## 2018-02-11 ENCOUNTER — Ambulatory Visit: Payer: Medicare Other | Admitting: Hematology

## 2018-02-11 ENCOUNTER — Other Ambulatory Visit: Payer: Medicare Other

## 2018-02-11 ENCOUNTER — Telehealth: Payer: Self-pay | Admitting: *Deleted

## 2018-02-11 LAB — GLUCOSE, CAPILLARY
GLUCOSE-CAPILLARY: 149 mg/dL — AB (ref 70–99)
GLUCOSE-CAPILLARY: 162 mg/dL — AB (ref 70–99)
GLUCOSE-CAPILLARY: 177 mg/dL — AB (ref 70–99)
Glucose-Capillary: 159 mg/dL — ABNORMAL HIGH (ref 70–99)
Glucose-Capillary: 177 mg/dL — ABNORMAL HIGH (ref 70–99)

## 2018-02-11 NOTE — Progress Notes (Addendum)
[facility])  Family has decided to take pt home with hospice support.  Pt and chart currently under review by [facility] physician and eligibility is pending at this time.  Met with family at bedside, they met with [facility] yesterday and have decided to take her home as opposed to residential hospice.  Currently has:  O2 through [facility].  Needs:  Bed with full rails, bedside table, suction.  [facility] DME specialist will order.  [facility] Referral Center is aware of the above information.  Completed discharge summary should be faxed to [phone], once eligibility confirmed, and completed.     Please notify [facility] at [phone] (if after 5 call [phone]) when the patient is ready to leave the unit.    [facility] information given to daughters:  Omaira, Mellen, Solmon Ice and Clara at bedside   Above information shared with [facility] RN case manager  **13:45 addendum, eligibility has been confirmed by [facility] physician  Thank you, Venia Carbon BSN, RN [facility] Liaison (listed in [place]) [phone]

## 2018-02-11 NOTE — Telephone Encounter (Signed)
Orders received for continuation of care from [facility]. Orders reviewed, signed by Dr. [name] and faxed back to [facility] [phone]. Fax confirmation received.

## 2018-02-11 NOTE — Care Management Note (Addendum)
Case Management Note  Patient Details  Name: Elizabeth Calhoun MRN: [id number] Date of Birth: [birth year]  Subjective/Objective:    Family has decided to d/c home with [facility] services. CM called and left message for [facility] with [facility]. CM spoke to 2 of patients daughters at the bedside and they informed CM that [facility] was on the way to meet with them. CM following.                Action/Plan: 1400: spoke to [place] with [facility]. DME has been ordered but will not be delivered until late today vs am. Plan is to get pt home in the am. MD updated and in agreement. Family aware of the plan. CM following.  Expected Discharge Date:  02/11/18               Expected Discharge Plan:  Home w Hospice Care  In-House Referral:     Discharge planning Services  CM Consult  Post Acute Care Choice:    Choice offered to:     DME Arranged:    DME Agency:     HH Arranged:    [place] Agency:     Status of Service:  Completed, signed off  If discussed at [employer], dates discussed:    Additional Comments:  Pollie Friar, RN 02/11/2018, 11:20 AM

## 2018-02-11 NOTE — Progress Notes (Signed)
Pt retaining urine with bladder scan greater than 1020ml according to RN Ala; nurse Ala notified MD and leadership and foley order received d/t retention and end of life. Perineal care done and pt foley inserted per protocol and order with RN Ala assistance. Yellow urine return noted in foley drainage back. Delia Heady RN

## 2018-02-11 NOTE — Discharge Summary (Signed)
Physician Discharge Summary  Elizabeth Calhoun MRN:[id number] DOB: 03/26/1945 DOA: 02/09/2018  PCP: Polite, Ronald, MD  Admit date: 02/09/2018 Discharge date: 02/11/2018  Admitted From: Home Disposition: Home with hospice  Recommendations for Outpatient Follow-up:  1. Follow up with PCP in 1-2 weeks 2. Please obtain BMP/CBC in one week your next doctors visit.  3. Home hospice  Home Health: Hospice Equipment/Devices: None Discharge Condition: Stable CODE STATUS: DNR Diet recommendation: Comfort feeding  Brief/Interim Summary: 72-year-old with history of essential hypertension, diabetes mellitus type 2, hyperlipidemia, CVA, pulmonary embolism on Xarelto, metastatic lung cancer on palliative radiation presented to the ER with altered mental status.  MRI of the brain during the hospitalization showed scattered posterior circulation stroke with basilar occlusion.  Neurology recommended poor prognosis and comfort measures.  Case was discussed with the family who agreed with this.  Family met with hospice team and decided to proceed with hospice. Eventually patient's family decided to take her home with home hospice therefore arrangements were made.   Discharge Diagnoses:  Principal Problem:   AMS (altered mental status) Active Problems:   Bone metastasis (HCC)   Pulmonary embolism (HCC)   HTN (hypertension)   HLD (hyperlipidemia)   Stroke (HCC)   Diabetes mellitus without complication (HCC)   Goals of care, counseling/discussion   Stage 4 lung cancer (HCC)   CVA (cerebral vascular accident) (HCC)   Acute metabolic encephalopathy secondary to acute CVA, nonhemorrhagic Scattered posterior circulation CVA with basilar occlusion - Recommend comfort care at this time.  Comfort care orders have been placed.  I had an extensive discussion with the patient's daughter who agrees with this.  All the questions have been answered.    Appreciate input from hospice team, family would like to proceed  to take the patient home with hospice.  Stage IV lung cancer with metastases - Comfort care.  Diabetes mellitus type 2 without complication - Hold off on Accu-Cheks.  Hold long-acting insulin.  Family does not want POC is to be done.  Essential hypertension -Hold off on home p.o. medications.  Hyperlipidemia -Hold off on p.o. medications  Pulmonary embolism - Hold home p.o. Xarelto.  Will advise family for comfort feeding. Discharge today once arrangements has been made for home hospice.  Discharge Instructions   Allergies as of 02/11/2018      Reactions   Aspirin Other (See Comments)   Causes lethargy      Medication List    STOP taking these medications   acetaminophen 500 MG tablet Commonly known as:  TYLENOL   benzonatate 100 MG capsule Commonly known as:  TESSALON   feeding supplement (GLUCERNA SHAKE) Liqd   hydrochlorothiazide 25 MG tablet Commonly known as:  HYDRODIURIL   ipratropium-albuterol 0.5-2.5 (3) MG/3ML Soln Commonly known as:  DUONEB   losartan 25 MG tablet Commonly known as:  COZAAR   ondansetron 4 MG disintegrating tablet Commonly known as:  ZOFRAN ODT   polyethylene glycol packet Commonly known as:  MIRALAX / GLYCOLAX   Rivaroxaban 15 & 20 MG Tbpk   rosuvastatin 40 MG tablet Commonly known as:  CRESTOR   sorbitol 70 % Soln   TRESIBA FLEXTOUCH 100 UNIT/ML Sopn FlexTouch Pen Generic drug:  insulin degludec   zolpidem 10 MG tablet Commonly known as:  AMBIEN     TAKE these medications   fentaNYL 12 MCG/HR Commonly known as:  DURAGESIC Place 1 patch onto the skin every 3 (three) days for 5 days.   haloperidol 0.5 MG tablet Commonly known   as:  HALDOL Take 1 tablet (0.5 mg total) by mouth every 4 (four) hours as needed for up to 5 days for agitation (or delirium).   HYDROmorphone 2 MG tablet Commonly known as:  DILAUDID Take 1-2 tablets (2-4 mg total) by mouth every 4 (four) hours as needed for up to 3 days for severe  pain.   LORazepam 1 MG tablet Commonly known as:  ATIVAN Take 1 tablet (1 mg total) by mouth every 4 (four) hours as needed for up to 5 days for anxiety.   morphine 20 MG/5ML solution Take 1.3 mLs (5.2 mg total) by mouth every 3 (three) hours as needed for up to 5 days for pain.   senna-docusate 8.6-50 MG tablet Commonly known as:  Senokot-S Take 1 tablet by mouth at bedtime as needed for up to 30 days for mild constipation or moderate constipation.      Follow-up Information    Seward Carol, MD. Schedule an appointment as soon as possible for a visit in 1 week(s).   Specialty:  Internal Medicine Contact information: [address] [place] [address] [phone]          Allergies  Allergen Reactions  . Aspirin Other (See Comments)    Causes lethargy    You were cared for by a hospitalist during your hospital stay. If you have any questions about your discharge medications or the care you received while you were in the hospital after you are discharged, you can call the unit and asked to speak with the hospitalist on call if the hospitalist that took care of you is not available. Once you are discharged, your primary care physician will handle any further medical issues. Please note that no refills for any discharge medications will be authorized once you are discharged, as it is imperative that you return to your primary care physician (or establish a relationship with a primary care physician if you do not have one) for your aftercare needs so that they can reassess your need for medications and monitor your lab values.  Consultations:  Neurology  Hospice   Procedures/Studies: Dg Chest 2 View  Result Date: 02/05/2018 CLINICAL DATA:  Fever, lung cancer EXAM: CHEST - 2 VIEW COMPARISON:  02/01/2018 Correlation: CT chest 01/30/2018 FINDINGS: Enlargement of cardiac silhouette. Atherosclerotic calcification aorta. Mediastinal contours and pulmonary  vascularity normal. BILATERAL pulmonary infiltrates consistent with pneumonia. This includes a focal area of opacity in LEFT mid lung similar to the previous exam. RIGHT lung infiltrates increased since previous study and now with small RIGHT pleural effusion. Medial LEFT apical neoplasm identified by previous imaging is not well visualized by this study. No pneumothorax. Bones demineralized with multilevel endplate spur formation thoracic spine. IMPRESSION: COPD changes with BILATERAL pulmonary infiltrates consistent with pneumonia, progressive in lower RIGHT lung since previous exam and now associated with a small RIGHT pleural effusion. Known LEFT apical neoplasm, poorly visualized. Electronically Signed   By: Lavonia Dana M.D.   On: 02/05/2018 14:12   Dg Chest 2 View  Result Date: 01/29/2018 CLINICAL DATA:  Loss of appetite. Stage IV lung cancer. EXAM: CHEST - 2 VIEW COMPARISON:  01/19/2018 CT FINDINGS: Low lung volumes. Borderline cardiomegaly with aortic atherosclerosis. Known spiculated AP window nodal mass as seen on prior CT measuring up to 2.3 cm radiographically likely accounts for the pulmonary opacity seen in the region of the AP window. Central vascular congestion is otherwise noted bilaterally. No aggressive osseous lesions. Osteoarthritis of the [facility] and  glenohumeral joints. IMPRESSION: Low lung volumes with borderline cardiomegaly and aortic atherosclerosis. Mild central vascular congestion is noted. Spiculated masslike opacity in the AP window compatible with known nodal mass on recent CT. Electronically Signed   By: David  Kwon M.D.   On: 01/29/2018 22:29   Dg Chest 2 View  Result Date: 01/19/2018 CLINICAL DATA:  Shortness of breath. EXAM: CHEST - 2 VIEW COMPARISON:  PET scan of December 10, 2017. Radiographs of December 07, 2017. FINDINGS: Stable cardiac silhouette. Left hilar spiculated abnormality is noted consistent with malignancy as described on PET scan. No pneumothorax is noted. No  pleural effusion is noted. Elevated left hemidiaphragm is noted. Right lung is clear. No acute consolidative process is noted. Bony thorax is unremarkable. IMPRESSION: Stable left hilar spiculated density is noted consistent with malignancy as described on PET scan. No significant changes noted compared to prior exam. Electronically Signed   By: James  Green Jr, M.D.   On: 01/19/2018 14:02   Ct Angio Chest Pe W And/or Wo Contrast  Result Date: 01/30/2018 CLINICAL DATA:  72-year-old female with failure to thrive. EXAM: CT ANGIOGRAPHY CHEST WITH CONTRAST TECHNIQUE: Multidetector CT imaging of the chest was performed using the standard protocol during bolus administration of intravenous contrast. Multiplanar CT image reconstructions and MIPs were obtained to evaluate the vascular anatomy. CONTRAST:  80mL ISOVUE-370 IOPAMIDOL (ISOVUE-370) INJECTION 76% COMPARISON:  Chest radiograph dated 01/29/2018 and CT dated 01/19/2018 FINDINGS: Cardiovascular: There is mild cardiomegaly. No pericardial effusion. There is multi vessel coronary vascular calcification. Mild atherosclerotic calcification of the thoracic aorta. No aneurysmal dilatation or evidence of dissection. There is mild dilatation of the main pulmonary trunk suggestive of a degree of pulmonary hypertension. Clinical correlation is recommended. Residual embolus noted in the right lower lobe segmental branch pulmonary artery as well as in the right upper lobe segmental branch. No new pulmonary artery embolus identified. There is high-grade narrowing of the left main pulmonary artery secondary to hilar mass/metastasis. Mediastinum/Nodes: Spiculated left hilar/suprahilar mass/metastasis measuring 3.8 x 3.0 cm similar to prior CT. There is encasement and narrowing of the left main pulmonary artery. There is diffuse thickening of the left mediastinal pleural surface. Right paratracheal lymph node measures 9 mm short axis. No mediastinal fluid collection.  Lungs/Pleura: A 2.7 x 2.6 cm left apical spiculated mass as well as smaller adjacent nodular densities, likely satellite lesions. There is diffuse ground-glass airspace density and nodularity right greater left, new or worsened since the prior CT. The rapid progression since the prior CT suggest an acute etiology such as edema or pneumonia. Lymphangitic spread of tumor is considered less likely given rapid interval development. Clinical correlation and follow-up recommended. No large pleural effusion. There is no pneumothorax. The central airways are patent. Upper Abdomen: No acute abnormality. Musculoskeletal: Osteopenia with degenerative changes of the spine. Diffuse sclerotic lesions throughout the spine most consistent with metastatic disease. There is a lytic lesion involving the anterior half of T11 consistent with metastatic disease. Additional lytic metastatic disease involving the left scapula as seen previously. No acute pathologic fracture. Review of the MIP images confirms the above findings. IMPRESSION: 1. Interval development of bilateral ground-glass and nodular density most consistent with edema or pneumonia. Clinical correlation is recommended. 2. Left apical malignancy with nodal and osseous metastatic disease as seen previously. 3. Encasement and high-grade narrowing of the left main pulmonary artery by the hilar mass/metastatic node. 4. Residual pulmonary artery emboli similar to prior CT.  No new PE. Electronically Signed     By: Anner Crete M.D.   On: 01/30/2018 02:24   Ct Angio Chest Pe W/cm &/or Wo Cm  Result Date: 01/19/2018 CLINICAL DATA:  Worsening shortness of breath over the past few days. Recently diagnosed with metastatic lung cancer. EXAM: CT ANGIOGRAPHY CHEST WITH CONTRAST TECHNIQUE: Multidetector CT imaging of the chest was performed using the standard protocol during bolus administration of intravenous contrast. Multiplanar CT image reconstructions and MIPs were obtained to  evaluate the vascular anatomy. CONTRAST:  13m ISOVUE-370 IOPAMIDOL (ISOVUE-370) INJECTION 76% COMPARISON:  PET-CT dated December 10, 2017. CT chest dated December 07, 2017. FINDINGS: Cardiovascular: Acute segmental pulmonary emboli within the right upper and lower lobes. No central or lobar pulmonary emboli. Elevated RV/LV ratio of 1.6. No pericardial effusion. No thoracic aortic aneurysm or dissection. Coronary, aortic arch, and branch vessel atherosclerotic vascular disease. Mediastinum/Nodes: Unchanged aortopulmonary window nodal mass measuring 3.8 x 4.0 cm, this again completely encases the left pulmonary artery and left upper lobe bronchus. Other scattered mediastinal lymph nodes measuring up to 1 cm in short axis are unchanged. Stable to slightly decreased left hilar lymph node measuring 1.3 cm, previously 1.5 cm. No axillary right hilar lymphadenopathy. The thyroid gland, trachea, and esophagus demonstrate no significant findings. Lungs/Pleura: Unchanged 3.1 cm mass in the left apical of lobe. Additional scattered nodules in the left upper lobe are also unchanged. Stable multifocal pleural nodularity in the left hemithorax. No pleural effusion or pneumothorax. Upper Abdomen: No acute abnormality. Musculoskeletal: Unchanged lytic metastases involving the left scapula, left T2 pedicle and transverse process, and T11 vertebral body. Review of the MIP images confirms the above findings. IMPRESSION: 1. Acute segmental pulmonary emboli within the right upper and lower lobes. Evidence of right heart strain. 2. Unchanged 3.1 cm left apical lung mass with left upper lobe, left pleural, nodal, and osseous metastatic disease. The 4.0 cm aortopulmonary window nodal mass severely narrows the left pulmonary artery and left upper lobe bronchus. 3. Aortic atherosclerosis (ICD10-I70.0). Critical Value/emergent results were called by telephone at the time of interpretation on 01/19/2018 at 5:17 pm to Dr. TSherry Ruffing who verbally  acknowledged these results. Electronically Signed   By: WTitus DubinM.D.   On: 01/19/2018 17:26   Ct Cervical Spine Wo Contrast  Result Date: 02/09/2018 CLINICAL DATA:  Patient found down and unresponsive. History of lung carcinoma with brain metastatic disease. EXAM: CT CERVICAL SPINE WITHOUT CONTRAST TECHNIQUE: Multidetector CT imaging of the cervical spine was performed without intravenous contrast. Multiplanar CT image reconstructions were also generated. COMPARISON:  None. FINDINGS: Alignment: Normal. Skull base and vertebrae: Destructive lesion in the right transverse process and adjacent pedicle and lamina of T1. Lytic lesion in the transverse process and pedicle on the left of T2 with a nondisplaced transverse process fracture. Nondisplaced fracture of the spinous process of T2. Lytic lesion in the T2 vertebra. There are several other lucent lesions in the cervical spine which may reflect additional areas of metastatic disease, degenerative cystic changes or a combination. Soft tissues and spinal canal: No prevertebral fluid or swelling. No canal hematoma. No spinal canal mass. No neck masses or enlarged lymph nodes. Disc levels: Mild loss of disc height at C4-C5 with mild to moderate loss of disc height at C5-C6. Mild loss of disc height at C6-C7. Mild spondylotic disc bulging at these levels. No significant stenosis. No evidence of a disc herniation. Upper chest: Masses in the left apex consistent with the known lung carcinoma. No acute findings. Other: None. IMPRESSION: 1. Nondisplaced fractures  of the left transverse process of T2 and the spinous process of T2. These are most likely pathologic fractures and are of unclear chronicity. 2. No other fractures. 3. Metastatic destructive lesions noted in the right T1 transverse process extending to the adjacent pedicle and lamina and T2 vertebra and left T2 pedicle extending to the transverse process. Electronically Signed   By: Lajean Manes M.D.   On:  02/09/2018 13:24   Mr Jeri Cos And Wo Contrast  Result Date: 02/09/2018 CLINICAL DATA:  72 year old female found down, unresponsive. History of lung cancer with early metastatic disease to the brain suspected in December. EXAM: MRI HEAD WITHOUT AND WITH CONTRAST TECHNIQUE: Multiplanar, multiecho pulse sequences of the brain and surrounding structures were obtained without and with intravenous contrast. CONTRAST:  7 milliliters Gadavist COMPARISON:  Head MRA today reported separately. Head CT without contrast earlier today. Brain MRI 12/26/2017 FINDINGS: Brain: There is confluent restricted diffusion in the right PCA territory, bilateral superior cerebellar artery territories (greater on the left, series 5, image 37) and left thalamus (thalamus striate artery territory). Associated cytotoxic edema in those areas, but no acute hemorrhage or mass effect. There may be some restricted diffusion in the right midbrain today on series 5, image 30, but otherwise the brainstem is acutely spared (previous left pontine infarct). No anterior circulation restricted diffusion identified. Although imaging labeled postcontrast was obtained there is no contrast identified (no enhancement of the nasal mucosa). Patchy in conflict cerebral white matter T2 and FLAIR hyperintensity appears stable since December. No areas are specific for vasogenic edema today. No ventriculomegaly or intracranial mass effect. Vascular: The mid basilar artery flow void has been lost since December on series 9, image 16. The distal vertebral and other Major intracranial vascular flow voids are preserved. Skull and upper cervical spine: Accidentally sagittal T1 weighted imaging was not performed. Sinuses/Orbits: Stable, negative. Other: Mastoids are clear. IMPRESSION: 1. Acute Basilar Artery Occlusion on MRI/MRA. Occluded basilar beyond the AICAs, with acute bilateral SCA and right greater than left PCA territory infarcts (fetal type left PCA is spared  except for left thalamostriate small-vessel ischemia). 2. Cytotoxic edema associated with #1 but no associated hemorrhage or mass effect. 3. Although IV contrast was reportedly administered, there is no contrast apparent on these images. Salient findings discussed by telephone with Neurology Dr. Rory Percy at 5:10 pm on 02/09/2018. Electronically Signed   By: Genevie Ann M.D.   On: 02/09/2018 17:11   Dg Chest Portable 1 View  Result Date: 02/09/2018 CLINICAL DATA:  Patient found down at 5 a.m. this morning. EXAM: PORTABLE CHEST 1 VIEW COMPARISON:  PA and lateral chest 02/01/2018. CT chest 02/05/2018 and 01/30/2018. FINDINGS: The lungs are emphysematous. Masslike opacity in the left mid lung is unchanged. Asymmetric opacity in the left apex relative to the to the right lung apex correlates with the patient's known left apical mass. Right lung is clear. Right basilar airspace disease and small right effusion seen on the most recent examination have improved. There is cardiomegaly. Atherosclerosis is seen. No acute or focal bony abnormality. IMPRESSION: Improved right basilar airspace disease and small effusion since the most recent examination. No change in a masslike density projecting in the left mid lung presumably due to infection as it is not seen on the patient's 01/30/2018 CT scan. Left apical mass lesion consistent with the patient's known carcinoma. Electronically Signed   By: Inge Rise M.D.   On: 02/09/2018 14:31   Dg Chest [facility] 1 View  Result  Date: 02/01/2018 CLINICAL DATA:  Stage IV primary lung cancer. Hypoxia. EXAM: PORTABLE CHEST 1 VIEW COMPARISON:  Chest x-ray dated 01/29/2018. Chest CT dated 01/30/2018. FINDINGS: New rounded opacity within the LEFT perihilar lung measures 4 cm greatest dimension. Increased interstitial markings noted within the RIGHT perihilar lung. No pleural effusion or pneumothorax seen. Heart size is stable. Osseous structures about the chest are unremarkable. IMPRESSION: 1.  New rounded opacity within the LEFT perihilar lung, measuring 4 cm greatest dimension. The rapid development suggests pneumonia, possibly postobstructive pneumonia given the perihilar mass/metastasis demonstrated on recent chest CT of 01/30/2018 with associated encasement of the LEFT main pulmonary artery. This new opacity is less likely asymmetric pulmonary edema. 2. Increased interstitial markings within the RIGHT perihilar lung, corresponding to the ground-glass opacities described on recent CT suggesting edema or atypical pneumonia. 3. LEFT hilar opacity, compatible with the hilar mass/metastatic lymph node described on recent chest CT report. Electronically Signed   By: Stan  Maynard M.D.   On: 02/01/2018 10:24   Mr Mra Head Wo Contrast  Addendum Date: 02/09/2018   ADDENDUM REPORT: 02/09/2018 17:12 ADDENDUM: Basilar Artery Occlusion was discussed by telephone with Neurology Dr. Arora at 5:10 pm on 02/09/2018. Electronically Signed   By: H  Hall M.D.   On: 02/09/2018 17:12   Result Date: 02/09/2018 CLINICAL DATA:  72-year-old female found down, unresponsive. History of lung cancer with early metastatic disease to the brain suspected in December. EXAM: MRA HEAD WITHOUT CONTRAST TECHNIQUE: Angiographic images of the Circle of Willis were obtained using MRA technique without intravenous contrast. COMPARISON:  MRI brain today reported separately. FINDINGS: Antegrade flow in the distal vertebral arteries, but both distal vertebral arteries are irregular. There is mild to moderate stenosis of the distal left V4. Basilar artery occlusion at the mid basilar artery level, just beyond patent AICA origins. No flow signal in the distal basilar or basilar tip. There is a fetal type left PCA origin with preserved left PCA flow signal, but absent bilateral SCA and right PCA flow. Antegrade flow in both ICA siphons. Both siphons are irregular compatible with atherosclerosis, but there is only mild stenosis in the supraclinoid  right ICA. Patent carotid termini. Patent MCA and ACA origins. Dominant left ACA. Severe stenosis in the left A2 segment but preserved distal left ACA flow signal. The right A1 *SCRATCHED* at the right A2 is diminutive and/or demonstrates poor flow. Left MCA M1 segment and bifurcation are patent. Visible left MCA branches are patent but irregular. Right MCA M1 segment and bifurcation are patent. Visible right MCA branches are patent but irregular. IMPRESSION: 1. 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 Basilar clues and distal to the AICA origins concordant with the infarct pattern on MRI today (fetal type left PCA origin preserves that territory). 2. Superimposed moderate to severe intracranial atherosclerosis with hemodynamically significant stenosis of the dominant left A2, and bilateral MCA branch irregularity. Electronically Signed: By: H  Hall M.D. On: 02/09/2018 17:06   Ct Head Code Stroke Wo Contrast  Result Date: 02/09/2018 CLINICAL DATA:  Code stroke.  Found down and unresponsive. EXAM: CT HEAD WITHOUT CONTRAST TECHNIQUE: Contiguous axial images were obtained from the base of the skull through the vertex without intravenous contrast. COMPARISON:  MR brain 12/26/2017. FINDINGS: Brain: No findings concerning for acute infarction or hemorrhage. No hydrocephalus or midline shift. No extra-axial fluid. There is vasogenic edema involving a large area of the RIGHT occipital lobe. There is additional vasogenic edema involving a large area   of the LEFT cerebellar hemisphere. Chronic infarction affects the RIGHT cerebellum. Chronic encephalomalacia also affects the RIGHT temporal lobe, possible post treatment change. Chronic lacunar infarction of the RIGHT frontal subcortical white matter. Extensive chronic microvascular ischemic change versus post treatment effect of the BILATERAL supratentorial white matter. Vascular: Calcification of the cavernous internal carotid arteries consistent with  cerebrovascular atherosclerotic disease. No signs of intracranial large vessel occlusion. Skull: No fracture. No clear metastatic disease. Sinuses/Orbits: No layering fluid. Negative orbits. Other: None. ASPECTS ([place] Stroke Program Early CT Score) - Ganglionic level infarction (caudate, lentiform nuclei, internal capsule, insula, M1-M3 cortex): 7 - Supraganglionic infarction (M4-M6 cortex): 3 Total score (0-10 with 10 being normal): 10 IMPRESSION: 1. No findings concerning for acute infarction or intracranial hemorrhage. 2. Vasogenic edema affecting the RIGHT occipital lobe and LEFT cerebellum, concerning for new metastatic disease. MRI of the brain without and with contrast, or post infusion CT imaging of the head is recommended for further evaluation. 3. ASPECTS is 10. 4. These results were communicated to Dr. Arora at 12:58 pmon 2/2/2020by text page via the AMION messaging system. Electronically Signed   By: John T Curnes M.D.   On: 02/09/2018 13:00      Subjective: Patient is laying in the bed sleeping.  Unresponsive to verbal stimuli.  Daughter is at the bedside.  All the questions have been answered.   Discharge Exam: Vitals:   02/10/18 2043 02/11/18 0900  BP: 127/68 123/70  Pulse: 75 71  Resp: 17 18  Temp: 99.5 F (37.5 C) 99.5 F (37.5 C)  SpO2: 100% 100%   Vitals:   02/09/18 2039 02/10/18 1313 02/10/18 2043 02/11/18 0900  BP: (!) 189/87 (!) 152/85 127/68 123/70  Pulse: 89 70 75 71  Resp: 14 (!) 24 17 18  Temp: 99 F (37.2 C) 99 F (37.2 C) 99.5 F (37.5 C) 99.5 F (37.5 C)  TempSrc: Oral Oral Oral Axillary  SpO2: 100% 100% 100% 100%  Weight:      Height:        General: Patient is unresponsive laying in the bed, does not appear to be any acute distress at this time. Cardiovascular: RRR, S1/S2 +, no rubs, no gallops Respiratory: Diffuse diminished breath sounds secondary to poor respiratory efforts Abdominal: Soft, NT, ND, bowel sounds + Extremities: no edema, no  cyanosis    The results of significant diagnostics from this hospitalization (including imaging, microbiology, ancillary and laboratory) are listed below for reference.     Microbiology: Recent Results (from the past 240 hour(s))  Culture, blood (Routine X 2) w Reflex to ID Panel     Status: None   Collection Time: 02/05/18 11:25 AM  Result Value Ref Range Status   Specimen Description   Final    BLOOD LEFT ARM Performed at [facility], [address] [place], [place] [address]    Special Requests   Final    BOTTLES DRAWN AEROBIC ONLY Blood Culture results may not be optimal due to an inadequate volume of blood received in culture bottles Performed at  [facility], [address] [place], [place] [address]    Culture   Final    NO GROWTH 5 DAYS Performed at [facility], [address] [place], [place] [address]    Report Status 02/10/2018 FINAL  Final  Culture, blood (Routine X 2) w Reflex to ID Panel     Status: None   Collection Time: 02/05/18 11:43 AM  Result Value Ref   Range Status   Specimen Description   Final    BLOOD LEFT HAND Performed at [facility] [address] [place], [place] [address]    Special Requests   Final    BOTTLES DRAWN AEROBIC AND ANAEROBIC Blood Culture adequate volume Performed at [facility] [address] [place], [place] [address]    Culture   Final    NO GROWTH 5 DAYS Performed at [facility], [place] [address] [place], [place] [address]    Report Status 02/10/2018 FINAL  Final  Urine culture     Status: None   Collection Time: 02/09/18  2:27 PM  Result Value Ref Range Status   Specimen Description URINE, RANDOM  Final   Special Requests   Final    NONE Performed at [facility], [place] [address], [place], [place] [address]    Culture NO GROWTH  Final   Report Status 02/10/2018 FINAL  Final     Labs: BNP (last 3 results) Recent  Labs    01/19/18 1439 01/30/18 0545  BNP 49.2 51.8   Basic Metabolic Panel: Recent Labs  Lab 02/05/18 0359 02/07/18 0823 02/09/18 1235 02/09/18 1236 02/09/18 1240  NA 139 136 135 136  --   K 3.6 3.6 3.2* 3.2*  --   CL 112* 104  --  98  --   CO2 21* 23  --  26  --   GLUCOSE 91 250*  --  167*  --   BUN 10 17  --  10  --   CREATININE 0.91 1.01*  --  0.87 0.60  CALCIUM 7.9* 8.6*  --  8.6*  --    Liver Function Tests: Recent Labs  Lab 02/09/18 1236  AST 27  ALT 13  ALKPHOS 159*  BILITOT 0.6  PROT 6.8  ALBUMIN 2.3*   No results for input(s): LIPASE, AMYLASE in the last 168 hours. Recent Labs  Lab 02/09/18 1248  AMMONIA 12   CBC: Recent Labs  Lab 02/05/18 0359 02/07/18 0823 02/09/18 1235 02/09/18 1236  WBC 6.1 8.3  --  8.8  NEUTROABS  --  7.0  --  7.0  HGB 8.1* 8.5* 8.8* 9.7*  HCT 26.9* 27.4* 26.0* 29.8*  MCV 95.7 93.5  --  89.5  PLT 340 403*  --  404*   Cardiac Enzymes: No results for input(s): CKTOTAL, CKMB, CKMBINDEX, TROPONINI in the last 168 hours. BNP: Invalid input(s): POCBNP CBG: Recent Labs  Lab 02/09/18 1222 02/09/18 1326 02/09/18 1724 02/10/18 2147 02/11/18 0908  GLUCAP 155* 142* 163* 107* 159*   D-Dimer No results for input(s): DDIMER in the last 72 hours. Hgb A1c No results for input(s): HGBA1C in the last 72 hours. Lipid Profile No results for input(s): CHOL, HDL, LDLCALC, TRIG, CHOLHDL, LDLDIRECT in the last 72 hours. Thyroid function studies No results for input(s): TSH, T4TOTAL, T3FREE, THYROIDAB in the last 72 hours.  Invalid input(s): FREET3 Anemia work up No results for input(s): VITAMINB12, FOLATE, FERRITIN, TIBC, IRON, RETICCTPCT in the last 72 hours. Urinalysis    Component Value Date/Time   COLORURINE YELLOW 02/09/2018 1427   APPEARANCEUR HAZY (A) 02/09/2018 1427   LABSPEC 1.020 02/09/2018 1427   PHURINE 6.0 02/09/2018 1427   GLUCOSEU 50 (A) 02/09/2018 1427   HGBUR NEGATIVE 02/09/2018 1427   BILIRUBINUR NEGATIVE  02/09/2018 1427   KETONESUR 5 (A) 02/09/2018 1427   PROTEINUR 100 (A) 02/09/2018 1427   UROBILINOGEN 0.2 01/10/2010 1749  NITRITE NEGATIVE 02/09/2018 1427   LEUKOCYTESUR NEGATIVE 02/09/2018 1427   Sepsis Labs Invalid input(s): PROCALCITONIN,  WBC,  LACTICIDVEN Microbiology Recent Results (from the past 240 hour(s))  Culture, blood (Routine X 2) w Reflex to ID Panel     Status: None   Collection Time: 02/05/18 11:25 AM  Result Value Ref Range Status   Specimen Description   Final    BLOOD LEFT ARM Performed at [facility] [address], [place], [place] [address]    Special Requests   Final    BOTTLES DRAWN AEROBIC ONLY Blood Culture results may not be optimal due to an inadequate volume of blood received in culture bottles Performed at [facility] [address], [place], [place] [address]    Culture   Final    NO GROWTH 5 DAYS Performed at [facility], [place] [address] [place], [place] [address]    Report Status 02/10/2018 FINAL  Final  Culture, blood (Routine X 2) w Reflex to ID Panel     Status: None   Collection Time: 02/05/18 11:43 AM  Result Value Ref Range Status   Specimen Description   Final    BLOOD LEFT HAND Performed at [facility] [address] [place], [place] [address]    Special Requests   Final    BOTTLES DRAWN AEROBIC AND ANAEROBIC Blood Culture adequate volume Performed at [facility] [address] [place], [place] [address]    Culture   Final    NO GROWTH 5 DAYS Performed at [facility], [place] [address], [place], [place] [address]    Report Status 02/10/2018 FINAL  Final  Urine culture     Status: None   Collection Time: 02/09/18  2:27 PM  Result Value Ref Range Status   Specimen Description URINE, RANDOM  Final   Special Requests   Final    NONE Performed at [facility], [place] [address], [place], [place] [address]    Culture NO GROWTH   Final   Report Status 02/10/2018 FINAL  Final     Time coordinating discharge:  I have spent 35 minutes face to face with the patient and on the ward discussing the patients care, assessment, plan and disposition with other care givers. >50% of the time was devoted counseling the patient about the risks and benefits of treatment/Discharge disposition and coordinating care.   SIGNED:   Damita Lack, MD  [facility] 02/11/2018, 10:44 AM   If 7PM-7AM, please contact night-coverage [email or web address]

## 2018-02-12 ENCOUNTER — Other Ambulatory Visit: Payer: Self-pay

## 2018-02-12 LAB — GLUCOSE, CAPILLARY: Glucose-Capillary: 195 mg/dL — ABNORMAL HIGH (ref 70–99)

## 2018-02-12 NOTE — Progress Notes (Signed)
[facility] ([facility])  Spoke with daughter [name], DME was delivered last night.  They request phone call to [facility] or [facility] (on facesheet) when pt is ready to leave the unit so they can be ready at her residence.  [facility] will send a nurse at 230 to meet with them.  Updated Case Manager.  Thank you, Venia Carbon BSN, [facility] Liaison (listed in [place]) [phone]

## 2018-02-12 NOTE — Care Management Note (Signed)
Case Management Note  Patient Details  Name: Elizabeth Calhoun MRN: [id number] Date of Birth: Jun 03, 1945  Subjective/Objective:                    Action/Plan: Pt discharging home with hospice. CM spoke with 2 daughters in the room, MD and bedside RN. [facility] called and transport arranged. Transport form at the desk.  Daughter provided prescriptions.  Expected Discharge Date:  02/12/18               Expected Discharge Plan:  Home w Hospice Care  In-House Referral:     Discharge planning Services  CM Consult  Post Acute Care Choice:    Choice offered to:     DME Arranged:    DME Agency:     HH Arranged:    [place] Agency:     Status of Service:  Completed, signed off  If discussed at [employer], dates discussed:    Additional Comments:  Pollie Friar, RN 02/12/2018, 10:57 AM

## 2018-02-12 NOTE — Discharge Summary (Signed)
Physician Discharge Summary  Elizabeth Calhoun ZDG:[id number] DOB: 08-05-1945 DOA: 02/09/2018  PCP: Seward Carol, MD  Admit date: 02/09/2018 Discharge date: 02/12/2018  Admitted From: Inpatient Disposition: home  Recommendations for Outpatient Follow-up:  1. Follow up with PCP in 1-2 weeks 2. Please obtain BMP/CBC in one week 3. Please follow up on the following pending results:  Home Health:No Equipment/Devices:per [facility]spice, hosp bed  Discharge Condition:Serious CODE STATUS:DNR Diet recommendation: Regular healthy diet  Brief/Interim Summary:   Pt remianed in the hospital an additional day for 4 arrangements with hospice: Family had multiple questions regarding prognosis.  Answered all questions.  Family now in agreement and ready for transfer home with hospice. Hospital course as below. Per priorr attending.  73 year old with history of essential hypertension, diabetes mellitus type 2, hyperlipidemia, CVA, pulmonary embolism on Xarelto, metastatic lung cancer on palliative radiation presented to the ER with altered mental status. MRI of the brain during the hospitalization showed scattered posterior circulation stroke with basilar occlusion. Neurology recommended poor prognosis and comfort measures. Case was discussed with the family who agreed with this.  Family met with hospice team and decided to proceed with hospice. Eventually patient's family decided to take her home with home hospice therefore arrangements were made.   Discharge Diagnoses:  Principal Problem:   AMS (altered mental status) Active Problems:   Bone metastasis (HCC)   Pulmonary embolism (HCC)   HTN (hypertension)   HLD (hyperlipidemia)   Stroke (HCC)   Diabetes mellitus without complication ([place])   Goals of care, counseling/discussion   Stage 4 lung cancer ([place])   CVA (cerebral vascular accident) ([place])   Acute metabolic encephalopathy secondary to acute CVA, nonhemorrhagic Scattered posterior  circulation CVA with basilar occlusion - Recommend comfort care at this time. Comfort care orders have been placed. I had an extensive discussion with the patient's daughter who agrees with this. All the questions have been answered.   Appreciate input from hospice team, family would like to proceed to take the patient home with hospice.  Stage IV lung cancer with metastases - Comfort care.  Diabetes mellitus type 2 without complication - Hold off on Accu-Cheks. Hold long-acting insulin. Family does not want POC is to be done.  Essential hypertension -Hold off on home p.o. medications.  Hyperlipidemia -Hold off on p.o. medications  Pulmonary embolism - Hold home p.o. Xarelto.  Will advise family for comfort feeding. Discharge today once arrangements has been made for home hospice.  Discharge Diagnoses:  Principal Problem:   AMS (altered mental status) Active Problems:   Bone metastasis (HCC)   Pulmonary embolism (HCC)   HTN (hypertension)   HLD (hyperlipidemia)   Stroke (HCC)   Diabetes mellitus without complication ([place])   Goals of care, counseling/discussion   Stage 4 lung cancer ([place])   CVA (cerebral vascular accident) [facility])    Discharge Instructions  Discharge Instructions    Diet - low sodium heart healthy   Complete by:  As directed    Increase activity slowly   Complete by:  As directed      Allergies as of 02/12/2018      Reactions   Aspirin Other (See Comments)   Causes lethargy      Medication List    STOP taking these medications   acetaminophen 500 MG tablet Commonly known as:  TYLENOL   benzonatate 100 MG capsule Commonly known as:  TESSALON   feeding supplement (GLUCERNA SHAKE) Liqd   hydrochlorothiazide 25 MG tablet Commonly known as:  HYDRODIURIL  ipratropium-albuterol 0.5-2.5 (3) MG/3ML Soln Commonly known as:  DUONEB   losartan 25 MG tablet Commonly known as:  COZAAR   ondansetron 4 MG disintegrating tablet Commonly  known as:  ZOFRAN ODT   polyethylene glycol packet Commonly known as:  MIRALAX / GLYCOLAX   Rivaroxaban 15 & 20 MG Tbpk   rosuvastatin 40 MG tablet Commonly known as:  CRESTOR   sorbitol 70 % Soln   TRESIBA FLEXTOUCH 100 UNIT/ML Sopn FlexTouch Pen Generic drug:  insulin degludec   zolpidem 10 MG tablet Commonly known as:  AMBIEN     TAKE these medications   fentaNYL 12 MCG/HR Commonly known as:  [place] 1 patch onto the skin every 3 (three) days for 5 days.   haloperidol 0.5 MG tablet Commonly known as:  HALDOL Take 1 tablet (0.5 mg total) by mouth every 4 (four) hours as needed for up to 5 days for agitation (or delirium).   HYDROmorphone 2 MG tablet Commonly known as:  DILAUDID Take 1-2 tablets (2-4 mg total) by mouth every 4 (four) hours as needed for up to 3 days for severe pain.   LORazepam 1 MG tablet Commonly known as:  ATIVAN Take 1 tablet (1 mg total) by mouth every 4 (four) hours as needed for up to 5 days for anxiety.   morphine 20 MG/5ML solution Take 1.3 mLs (5.2 mg total) by mouth every 3 (three) hours as needed for up to 5 days for pain.   senna-docusate 8.6-50 MG tablet Commonly known as:  Senokot-S Take 1 tablet by mouth at bedtime as needed for up to 30 days for mild constipation or moderate constipation.      Follow-up Information    Seward Carol, MD. Schedule an appointment as soon as possible for a visit in 1 week(s).   Specialty:  Internal Medicine Contact information: [address] [place] [address] [phone]          Allergies  Allergen Reactions  . Aspirin Other (See Comments)    Causes lethargy    Consultations:  neurology   Hospice     Procedures/Studies: Dg Chest 2 View  Result Date: 02/05/2018 CLINICAL DATA:  Fever, lung cancer EXAM: CHEST - 2 VIEW COMPARISON:  02/01/2018 Correlation: CT chest 01/30/2018 FINDINGS: Enlargement of cardiac silhouette. Atherosclerotic calcification  aorta. Mediastinal contours and pulmonary vascularity normal. BILATERAL pulmonary infiltrates consistent with pneumonia. This includes a focal area of opacity in LEFT mid lung similar to the previous exam. RIGHT lung infiltrates increased since previous study and now with small RIGHT pleural effusion. Medial LEFT apical neoplasm identified by previous imaging is not well visualized by this study. No pneumothorax. Bones demineralized with multilevel endplate spur formation thoracic spine. IMPRESSION: COPD changes with BILATERAL pulmonary infiltrates consistent with pneumonia, progressive in lower RIGHT lung since previous exam and now associated with a small RIGHT pleural effusion. Known LEFT apical neoplasm, poorly visualized. Electronically Signed   By: Lavonia Dana M.D.   On: 02/05/2018 14:12   Dg Chest 2 View  Result Date: 01/29/2018 CLINICAL DATA:  Loss of appetite. Stage IV lung cancer. EXAM: CHEST - 2 VIEW COMPARISON:  01/19/2018 CT FINDINGS: Low lung volumes. Borderline cardiomegaly with aortic atherosclerosis. Known spiculated AP window nodal mass as seen on prior CT measuring up to 2.3 cm radiographically likely accounts for the pulmonary opacity seen in the region of the AP window. Central vascular congestion is otherwise noted bilaterally. No aggressive osseous lesions. Osteoarthritis of the [facility]  and glenohumeral joints. IMPRESSION: Low lung volumes with borderline cardiomegaly and aortic atherosclerosis. Mild central vascular congestion is noted. Spiculated masslike opacity in the AP window compatible with known nodal mass on recent CT. Electronically Signed   By: Ashley Royalty M.D.   On: 01/29/2018 22:29   Dg Chest 2 View  Result Date: 01/19/2018 CLINICAL DATA:  Shortness of breath. EXAM: CHEST - 2 VIEW COMPARISON:  PET scan of December 10, 2017. Radiographs of December 07, 2017. FINDINGS: Stable cardiac silhouette. Left hilar spiculated abnormality is noted consistent with malignancy as described on  PET scan. No pneumothorax is noted. No pleural effusion is noted. Elevated left hemidiaphragm is noted. Right lung is clear. No acute consolidative process is noted. Bony thorax is unremarkable. IMPRESSION: Stable left hilar spiculated density is noted consistent with malignancy as described on PET scan. No significant changes noted compared to prior exam. Electronically Signed   By: Marijo Conception, M.D.   On: 01/19/2018 14:02   Ct Angio Chest Pe W And/or Wo Contrast  Result Date: 01/30/2018 CLINICAL DATA:  73 year old female with failure to thrive. EXAM: CT ANGIOGRAPHY CHEST WITH CONTRAST TECHNIQUE: Multidetector CT imaging of the chest was performed using the standard protocol during bolus administration of intravenous contrast. Multiplanar CT image reconstructions and MIPs were obtained to evaluate the vascular anatomy. CONTRAST:  32m ISOVUE-370 IOPAMIDOL (ISOVUE-370) INJECTION 76% COMPARISON:  Chest radiograph dated 01/29/2018 and CT dated 01/19/2018 FINDINGS: Cardiovascular: There is mild cardiomegaly. No pericardial effusion. There is multi vessel coronary vascular calcification. Mild atherosclerotic calcification of the thoracic aorta. No aneurysmal dilatation or evidence of dissection. There is mild dilatation of the main pulmonary trunk suggestive of a degree of pulmonary hypertension. Clinical correlation is recommended. Residual embolus noted in the right lower lobe segmental branch pulmonary artery as well as in the right upper lobe segmental branch. No new pulmonary artery embolus identified. There is high-grade narrowing of the left main pulmonary artery secondary to hilar mass/metastasis. Mediastinum/Nodes: Spiculated left hilar/suprahilar mass/metastasis measuring 3.8 x 3.0 cm similar to prior CT. There is encasement and narrowing of the left main pulmonary artery. There is diffuse thickening of the left mediastinal pleural surface. Right paratracheal lymph node measures 9 mm short axis. No  mediastinal fluid collection. Lungs/Pleura: A 2.7 x 2.6 cm left apical spiculated mass as well as smaller adjacent nodular densities, likely satellite lesions. There is diffuse ground-glass airspace density and nodularity right greater left, new or worsened since the prior CT. The rapid progression since the prior CT suggest an acute etiology such as edema or pneumonia. Lymphangitic spread of tumor is considered less likely given rapid interval development. Clinical correlation and follow-up recommended. No large pleural effusion. There is no pneumothorax. The central airways are patent. Upper Abdomen: No acute abnormality. Musculoskeletal: Osteopenia with degenerative changes of the spine. Diffuse sclerotic lesions throughout the spine most consistent with metastatic disease. There is a lytic lesion involving the anterior half of T11 consistent with metastatic disease. Additional lytic metastatic disease involving the left scapula as seen previously. No acute pathologic fracture. Review of the MIP images confirms the above findings. IMPRESSION: 1. Interval development of bilateral ground-glass and nodular density most consistent with edema or pneumonia. Clinical correlation is recommended. 2. Left apical malignancy with nodal and osseous metastatic disease as seen previously. 3. Encasement and high-grade narrowing of the left main pulmonary artery by the hilar mass/metastatic node. 4. Residual pulmonary artery emboli similar to prior CT.  No new PE. Electronically Signed  By: Anner Crete M.D.   On: 01/30/2018 02:24   Ct Angio Chest Pe W/cm &/or Wo Cm  Result Date: 01/19/2018 CLINICAL DATA:  Worsening shortness of breath over the past few days. Recently diagnosed with metastatic lung cancer. EXAM: CT ANGIOGRAPHY CHEST WITH CONTRAST TECHNIQUE: Multidetector CT imaging of the chest was performed using the standard protocol during bolus administration of intravenous contrast. Multiplanar CT image  reconstructions and MIPs were obtained to evaluate the vascular anatomy. CONTRAST:  59m ISOVUE-370 IOPAMIDOL (ISOVUE-370) INJECTION 76% COMPARISON:  PET-CT dated December 10, 2017. CT chest dated December 07, 2017. FINDINGS: Cardiovascular: Acute segmental pulmonary emboli within the right upper and lower lobes. No central or lobar pulmonary emboli. Elevated RV/LV ratio of 1.6. No pericardial effusion. No thoracic aortic aneurysm or dissection. Coronary, aortic arch, and branch vessel atherosclerotic vascular disease. Mediastinum/Nodes: Unchanged aortopulmonary window nodal mass measuring 3.8 x 4.0 cm, this again completely encases the left pulmonary artery and left upper lobe bronchus. Other scattered mediastinal lymph nodes measuring up to 1 cm in short axis are unchanged. Stable to slightly decreased left hilar lymph node measuring 1.3 cm, previously 1.5 cm. No axillary right hilar lymphadenopathy. The thyroid gland, trachea, and esophagus demonstrate no significant findings. Lungs/Pleura: Unchanged 3.1 cm mass in the left apical of lobe. Additional scattered nodules in the left upper lobe are also unchanged. Stable multifocal pleural nodularity in the left hemithorax. No pleural effusion or pneumothorax. Upper Abdomen: No acute abnormality. Musculoskeletal: Unchanged lytic metastases involving the left scapula, left T2 pedicle and transverse process, and T11 vertebral body. Review of the MIP images confirms the above findings. IMPRESSION: 1. Acute segmental pulmonary emboli within the right upper and lower lobes. Evidence of right heart strain. 2. Unchanged 3.1 cm left apical lung mass with left upper lobe, left pleural, nodal, and osseous metastatic disease. The 4.0 cm aortopulmonary window nodal mass severely narrows the left pulmonary artery and left upper lobe bronchus. 3. Aortic atherosclerosis (ICD10-I70.0). Critical Value/emergent results were called by telephone at the time of interpretation on 01/19/2018  at 5:17 pm to Dr. TSherry Ruffing who verbally acknowledged these results. Electronically Signed   By: WTitus DubinM.D.   On: 01/19/2018 17:26   Ct Cervical Spine Wo Contrast  Result Date: 02/09/2018 CLINICAL DATA:  Patient found down and unresponsive. History of lung carcinoma with brain metastatic disease. EXAM: CT CERVICAL SPINE WITHOUT CONTRAST TECHNIQUE: Multidetector CT imaging of the cervical spine was performed without intravenous contrast. Multiplanar CT image reconstructions were also generated. COMPARISON:  None. FINDINGS: Alignment: Normal. Skull base and vertebrae: Destructive lesion in the right transverse process and adjacent pedicle and lamina of T1. Lytic lesion in the transverse process and pedicle on the left of T2 with a nondisplaced transverse process fracture. Nondisplaced fracture of the spinous process of T2. Lytic lesion in the T2 vertebra. There are several other lucent lesions in the cervical spine which may reflect additional areas of metastatic disease, degenerative cystic changes or a combination. Soft tissues and spinal canal: No prevertebral fluid or swelling. No canal hematoma. No spinal canal mass. No neck masses or enlarged lymph nodes. Disc levels: Mild loss of disc height at C4-C5 with mild to moderate loss of disc height at C5-C6. Mild loss of disc height at C6-C7. Mild spondylotic disc bulging at these levels. No significant stenosis. No evidence of a disc herniation. Upper chest: Masses in the left apex consistent with the known lung carcinoma. No acute findings. Other: None. IMPRESSION: 1. Nondisplaced fractures  of the left transverse process of T2 and the spinous process of T2. These are most likely pathologic fractures and are of unclear chronicity. 2. No other fractures. 3. Metastatic destructive lesions noted in the right T1 transverse process extending to the adjacent pedicle and lamina and T2 vertebra and left T2 pedicle extending to the transverse process.  Electronically Signed   By: Lajean Manes M.D.   On: 02/09/2018 13:24   Mr Jeri Cos And Wo Contrast  Result Date: 02/09/2018 CLINICAL DATA:  73 year old female found down, unresponsive. History of lung cancer with early metastatic disease to the brain suspected in December. EXAM: MRI HEAD WITHOUT AND WITH CONTRAST TECHNIQUE: Multiplanar, multiecho pulse sequences of the brain and surrounding structures were obtained without and with intravenous contrast. CONTRAST:  7 milliliters Gadavist COMPARISON:  Head MRA today reported separately. Head CT without contrast earlier today. Brain MRI 12/26/2017 FINDINGS: Brain: There is confluent restricted diffusion in the right PCA territory, bilateral superior cerebellar artery territories (greater on the left, series 5, image 37) and left thalamus (thalamus striate artery territory). Associated cytotoxic edema in those areas, but no acute hemorrhage or mass effect. There may be some restricted diffusion in the right midbrain today on series 5, image 30, but otherwise the brainstem is acutely spared (previous left pontine infarct). No anterior circulation restricted diffusion identified. Although imaging labeled postcontrast was obtained there is no contrast identified (no enhancement of the nasal mucosa). Patchy in conflict cerebral white matter T2 and FLAIR hyperintensity appears stable since December. No areas are specific for vasogenic edema today. No ventriculomegaly or intracranial mass effect. Vascular: The mid basilar artery flow void has been lost since December on series 9, image 16. The distal vertebral and other Major intracranial vascular flow voids are preserved. Skull and upper cervical spine: Accidentally sagittal T1 weighted imaging was not performed. Sinuses/Orbits: Stable, negative. Other: Mastoids are clear. IMPRESSION: 1. Acute Basilar Artery Occlusion on MRI/MRA. Occluded basilar beyond the AICAs, with acute bilateral SCA and right greater than left PCA  territory infarcts (fetal type left PCA is spared except for left thalamostriate small-vessel ischemia). 2. Cytotoxic edema associated with #1 but no associated hemorrhage or mass effect. 3. Although IV contrast was reportedly administered, there is no contrast apparent on these images. Salient findings discussed by telephone with Neurology Dr. Rory Percy at 5:10 pm on 02/09/2018. Electronically Signed   By: Genevie Ann M.D.   On: 02/09/2018 17:11   Dg Chest Portable 1 View  Result Date: 02/09/2018 CLINICAL DATA:  Patient found down at 5 a.m. this morning. EXAM: PORTABLE CHEST 1 VIEW COMPARISON:  PA and lateral chest 02/01/2018. CT chest 02/05/2018 and 01/30/2018. FINDINGS: The lungs are emphysematous. Masslike opacity in the left mid lung is unchanged. Asymmetric opacity in the left apex relative to the to the right lung apex correlates with the patient's known left apical mass. Right lung is clear. Right basilar airspace disease and small right effusion seen on the most recent examination have improved. There is cardiomegaly. Atherosclerosis is seen. No acute or focal bony abnormality. IMPRESSION: Improved right basilar airspace disease and small effusion since the most recent examination. No change in a masslike density projecting in the left mid lung presumably due to infection as it is not seen on the patient's 01/30/2018 CT scan. Left apical mass lesion consistent with the patient's known carcinoma. Electronically Signed   By: Inge Rise M.D.   On: 02/09/2018 14:31   Dg Chest [facility] 1 View  Result  Date: 02/01/2018 CLINICAL DATA:  Stage IV primary lung cancer. Hypoxia. EXAM: PORTABLE CHEST 1 VIEW COMPARISON:  Chest x-ray dated 01/29/2018. Chest CT dated 01/30/2018. FINDINGS: New rounded opacity within the LEFT perihilar lung measures 4 cm greatest dimension. Increased interstitial markings noted within the RIGHT perihilar lung. No pleural effusion or pneumothorax seen. Heart size is stable. Osseous structures  about the chest are unremarkable. IMPRESSION: 1. New rounded opacity within the LEFT perihilar lung, measuring 4 cm greatest dimension. The rapid development suggests pneumonia, possibly postobstructive pneumonia given the perihilar mass/metastasis demonstrated on recent chest CT of 01/30/2018 with associated encasement of the LEFT main pulmonary artery. This new opacity is less likely asymmetric pulmonary edema. 2. Increased interstitial markings within the RIGHT perihilar lung, corresponding to the ground-glass opacities described on recent CT suggesting edema or atypical pneumonia. 3. LEFT hilar opacity, compatible with the hilar mass/metastatic lymph node described on recent chest CT report. Electronically Signed   By: Franki Cabot M.D.   On: 02/01/2018 10:24   Mr Jodene Nam Head Wo Contrast  Addendum Date: 02/09/2018   ADDENDUM REPORT: 02/09/2018 17:12 ADDENDUM: Basilar Artery Occlusion was discussed by telephone with Neurology Dr. Rory Percy at 5:10 pm on 02/09/2018. Electronically Signed   By: Genevie Ann M.D.   On: 02/09/2018 17:12   Result Date: 02/09/2018 CLINICAL DATA:  73 year old female found down, unresponsive. History of lung cancer with early metastatic disease to the brain suspected in December. EXAM: MRA HEAD WITHOUT CONTRAST TECHNIQUE: Angiographic images of the Circle of Willis were obtained using MRA technique without intravenous contrast. COMPARISON:  MRI brain today reported separately. FINDINGS: Antegrade flow in the distal vertebral arteries, but both distal vertebral arteries are irregular. There is mild to moderate stenosis of the distal left V4. Basilar artery occlusion at the mid basilar artery level, just beyond patent AICA origins. No flow signal in the distal basilar or basilar tip. There is a fetal type left PCA origin with preserved left PCA flow signal, but absent bilateral SCA and right PCA flow. Antegrade flow in both ICA siphons. Both siphons are irregular compatible with atherosclerosis,  but there is only mild stenosis in the supraclinoid right ICA. Patent carotid termini. Patent MCA and ACA origins. Dominant left ACA. Severe stenosis in the left A2 segment but preserved distal left ACA flow signal. The right A1 *SCRATCHED* at the right A2 is diminutive and/or demonstrates poor flow. Left MCA M1 segment and bifurcation are patent. Visible left MCA branches are patent but irregular. Right MCA M1 segment and bifurcation are patent. Visible right MCA branches are patent but irregular. IMPRESSION: 1. 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 Basilar clues and distal to the AICA origins concordant with the infarct pattern on MRI today (fetal type left PCA origin preserves that territory). 2. Superimposed moderate to severe intracranial atherosclerosis with hemodynamically significant stenosis of the dominant left A2, and bilateral MCA branch irregularity. Electronically Signed: By: Genevie Ann M.D. On: 02/09/2018 17:06   Ct Head Code Stroke Wo Contrast  Result Date: 02/09/2018 CLINICAL DATA:  Code stroke.  Found down and unresponsive. EXAM: CT HEAD WITHOUT CONTRAST TECHNIQUE: Contiguous axial images were obtained from the base of the skull through the vertex without intravenous contrast. COMPARISON:  MR brain 12/26/2017. FINDINGS: Brain: No findings concerning for acute infarction or hemorrhage. No hydrocephalus or midline shift. No extra-axial fluid. There is vasogenic edema involving a large area of the RIGHT occipital lobe. There is additional vasogenic edema involving a large area  of the LEFT cerebellar hemisphere. Chronic infarction affects the RIGHT cerebellum. Chronic encephalomalacia also affects the RIGHT temporal lobe, possible post treatment change. Chronic lacunar infarction of the RIGHT frontal subcortical white matter. Extensive chronic microvascular ischemic change versus post treatment effect of the BILATERAL supratentorial white matter. Vascular: Calcification of the  cavernous internal carotid arteries consistent with cerebrovascular atherosclerotic disease. No signs of intracranial large vessel occlusion. Skull: No fracture. No clear metastatic disease. Sinuses/Orbits: No layering fluid. Negative orbits. Other: None. ASPECTS [facility] Stroke Program Early CT Score) - Ganglionic level infarction (caudate, lentiform nuclei, internal capsule, insula, M1-M3 cortex): 7 - Supraganglionic infarction (M4-M6 cortex): 3 Total score (0-10 with 10 being normal): 10 IMPRESSION: 1. No findings concerning for acute infarction or intracranial hemorrhage. 2. Vasogenic edema affecting the RIGHT occipital lobe and LEFT cerebellum, concerning for new metastatic disease. MRI of the brain without and with contrast, or post infusion CT imaging of the head is recommended for further evaluation. 3. ASPECTS is 10. 4. These results were communicated to Dr. Rory Percy at 12:58 pmon 2/2/2020by text page via the [facility] messaging system. Electronically Signed   By: Staci Righter M.D.   On: 02/09/2018 13:00       Subjective:   Discharge Exam: Vitals:   02/11/18 2025 02/12/18 0731  BP: (!) 144/66 127/70  Pulse: 67 61  Resp: 17 15  Temp: 98 F (36.7 C) 98.3 F (36.8 C)  SpO2: 100% 100%   Vitals:   02/11/18 1206 02/11/18 1513 02/11/18 2025 02/12/18 0731  BP: 123/71 128/68 (!) 144/66 127/70  Pulse: 68 71 67 61  Resp: _0 Temp: 99 F (37.2 C) 98.9 F (37.2 C) 98 F (36.7 C) 98.3 F (36.8 C)  TempSrc: Axillary Axillary Axillary Oral  SpO2: 100% 97% 100% 100%  Weight:      Height:        General: Pt is alert, awake, not in acute distress Cardiovascular: RRR, S1/S2 +, no rubs, no gallops Respiratory: CTA bilaterally, no wheezing, no rhonchi Abdominal: Soft, NT, ND, bowel sounds + Extremities: no edema, no cyanosis    The results of significant diagnostics from this hospitalization (including imaging, microbiology, ancillary and laboratory) are listed below for reference.      Microbiology: Recent Results (from the past 240 hour(s))  Culture, blood (Routine X 2) w Reflex to ID Panel     Status: None   Collection Time: 02/05/18 11:25 AM  Result Value Ref Range Status   Specimen Description   Final    BLOOD LEFT ARM Performed at [facility] [address], [place], [place] [address]    Special Requests   Final    BOTTLES DRAWN AEROBIC ONLY Blood Culture results may not be optimal due to an inadequate volume of blood received in culture bottles Performed at [facility] [address], [place], [place] [address]    Culture   Final    NO GROWTH 5 DAYS Performed at [facility], [place] [address] [place], [place] [address]    Report Status 02/10/2018 FINAL  Final  Culture, blood (Routine X 2) w Reflex to ID Panel     Status: None   Collection Time: 02/05/18 11:43 AM  Result Value Ref Range Status   Specimen Description   Final    BLOOD LEFT HAND Performed at [facility] [address] [place], [place] [address]    Special Requests   Final  BOTTLES DRAWN AEROBIC AND ANAEROBIC Blood Culture adequate volume Performed at [facility] [address] [place], [place] [address]    Culture   Final    NO GROWTH 5 DAYS Performed at [facility], [place] [address] [place], [place] [address]    Report Status 02/10/2018 FINAL  Final  Urine culture     Status: None   Collection Time: 02/09/18  2:27 PM  Result Value Ref Range Status   Specimen Description URINE, RANDOM  Final   Special Requests   Final    NONE Performed at [facility], [place] [address] [place], [place] [address]    Culture NO GROWTH  Final   Report Status 02/10/2018 FINAL  Final     Labs: BNP (last 3 results) Recent Labs    01/19/18 1439 01/30/18 0545  BNP 49.2 19.7   Basic Metabolic Panel: Recent Labs  Lab 02/07/18 0823 02/09/18 1235 02/09/18 1236 02/09/18 1240  NA 136 135 136   --   K 3.6 3.2* 3.2*  --   CL 104  --  98  --   CO2 23  --  26  --   GLUCOSE 250*  --  167*  --   BUN 17  --  10  --   CREATININE 1.01*  --  0.87 0.60  CALCIUM 8.6*  --  8.6*  --    Liver Function Tests: Recent Labs  Lab 02/09/18 1236  AST 27  ALT 13  ALKPHOS 159*  BILITOT 0.6  PROT 6.8  ALBUMIN 2.3*   No results for input(s): LIPASE, AMYLASE in the last 168 hours. Recent Labs  Lab 02/09/18 1248  AMMONIA 12   CBC: Recent Labs  Lab 02/07/18 0823 02/09/18 1235 02/09/18 1236  WBC 8.3  --  8.8  NEUTROABS 7.0  --  7.0  HGB 8.5* 8.8* 9.7*  HCT 27.4* 26.0* 29.8*  MCV 93.5  --  89.5  PLT 403*  --  404*   Cardiac Enzymes: No results for input(s): CKTOTAL, CKMB, CKMBINDEX, TROPONINI in the last 168 hours. BNP: Invalid input(s): POCBNP CBG: Recent Labs  Lab 02/11/18 1242 02/11/18 1732 02/11/18 2108 02/11/18 2349 02/12/18 0526  GLUCAP 149* 162* 177* 177* 195*   D-Dimer No results for input(s): DDIMER in the last 72 hours. Hgb A1c No results for input(s): HGBA1C in the last 72 hours. Lipid Profile No results for input(s): CHOL, HDL, LDLCALC, TRIG, CHOLHDL, LDLDIRECT in the last 72 hours. Thyroid function studies No results for input(s): TSH, T4TOTAL, T3FREE, THYROIDAB in the last 72 hours.  Invalid input(s): FREET3 Anemia work up No results for input(s): VITAMINB12, FOLATE, FERRITIN, TIBC, IRON, RETICCTPCT in the last 72 hours. Urinalysis    Component Value Date/Time   COLORURINE YELLOW 02/09/2018 1427   APPEARANCEUR HAZY (A) 02/09/2018 1427   LABSPEC 1.020 02/09/2018 1427   PHURINE 6.0 02/09/2018 1427   GLUCOSEU 50 (A) 02/09/2018 1427   HGBUR NEGATIVE 02/09/2018 1427   BILIRUBINUR NEGATIVE 02/09/2018 1427   KETONESUR 5 (A) 02/09/2018 1427   PROTEINUR 100 (A) 02/09/2018 1427   UROBILINOGEN 0.2 01/10/2010 1749   NITRITE NEGATIVE 02/09/2018 1427   LEUKOCYTESUR NEGATIVE 02/09/2018 1427   Sepsis Labs Invalid input(s): PROCALCITONIN,  WBC,   LACTICIDVEN Microbiology Recent Results (from the past 240 hour(s))  Culture, blood (Routine X 2) w Reflex to ID Panel     Status: None   Collection Time: 02/05/18 11:25 AM  Result Value Ref Range  Status   Specimen Description   Final    BLOOD LEFT ARM Performed at [facility] [address], [place], [place] [address]    Special Requests   Final    BOTTLES DRAWN AEROBIC ONLY Blood Culture results may not be optimal due to an inadequate volume of blood received in culture bottles Performed at [facility] [address], [place], [place] [address]    Culture   Final    NO GROWTH 5 DAYS Performed at [facility], [place] [address] [place], [place] [address]    Report Status 02/10/2018 FINAL  Final  Culture, blood (Routine X 2) w Reflex to ID Panel     Status: None   Collection Time: 02/05/18 11:43 AM  Result Value Ref Range Status   Specimen Description   Final    BLOOD LEFT HAND Performed at [facility] [address], [place], [place] [address]    Special Requests   Final    BOTTLES DRAWN AEROBIC AND ANAEROBIC Blood Culture adequate volume Performed at [facility] [address] [place], [place] [address]    Culture   Final    NO GROWTH 5 DAYS Performed at [facility], [place] [address], [place], [place] [address]    Report Status 02/10/2018 FINAL  Final  Urine culture     Status: None   Collection Time: 02/09/18  2:27 PM  Result Value Ref Range Status   Specimen Description URINE, RANDOM  Final   Special Requests   Final    NONE Performed at [facility], [place] [address], [place], [place] [address]    Culture NO GROWTH  Final   Report Status 02/10/2018 FINAL  Final     Time coordinating discharge: Over 30 minutes  SIGNED:   Nicolette Bang, MD  [facility] 02/12/2018, 9:52 AM Pager   If 7PM-7AM, please contact night-coverage [email or web address] Password  [email or web address]

## 2018-02-12 NOTE — Progress Notes (Signed)
Pt being discharged from hospital with hospice per orders from MD. All questions and concerns were addressed with family. Pt's IV was removed prior to discharge. Pt exited hospital via stretcher accompanied by transport.

## 2018-02-17 ENCOUNTER — Ambulatory Visit: Payer: Self-pay | Admitting: Radiation Oncology

## 2018-03-09 DEATH — deceased

## 2019-03-05 IMAGING — MR MR MRA HEAD W/O CM
1 series · 19 of 48 positions shown · non-contrast
Comparison: MRI brain today reported separately.

Addendum:
CLINICAL DATA: 72-year-old female found down, unresponsive. History
of lung cancer with early metastatic disease to the brain suspected
in [REDACTED].

EXAM:
MRA HEAD WITHOUT CONTRAST
TECHNIQUE: Angiographic images of the Circle of Willis were obtained using MRA
technique without intravenous contrast.

[Series 3: ax (id) 2 · axial · 1.0mm · 0.43mm/px · z∈[-44,+40]mm · 19 of 184 slices shown]
[im 1/184]
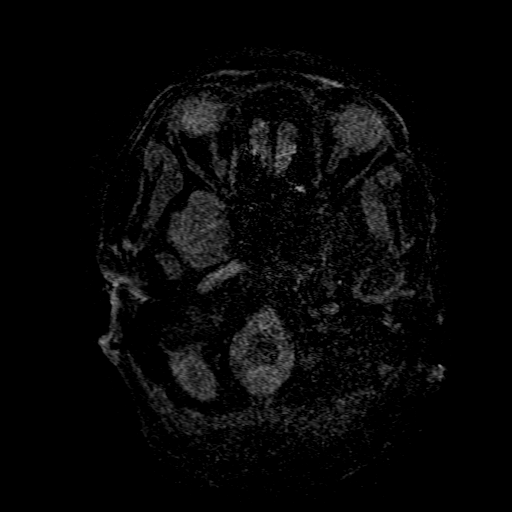
[im 4/184]
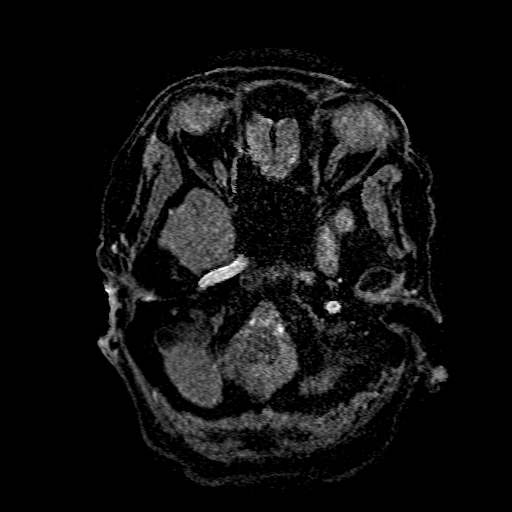
[im 8/184]
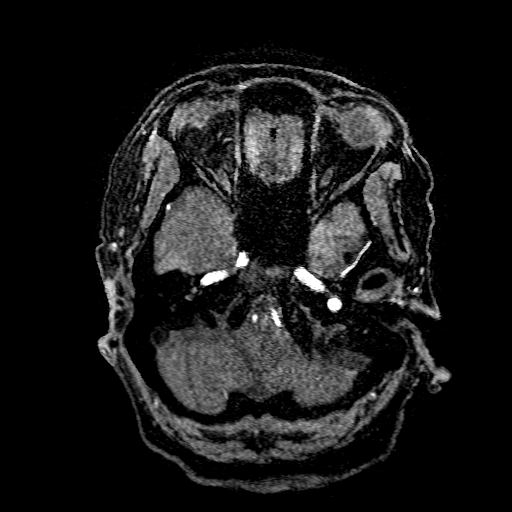
[im 12/184]
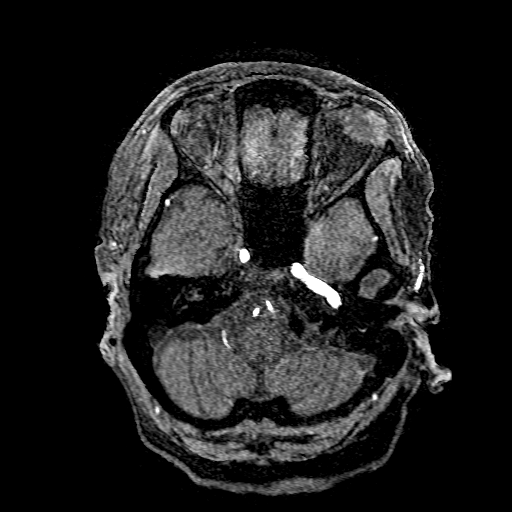
[im 16/184]
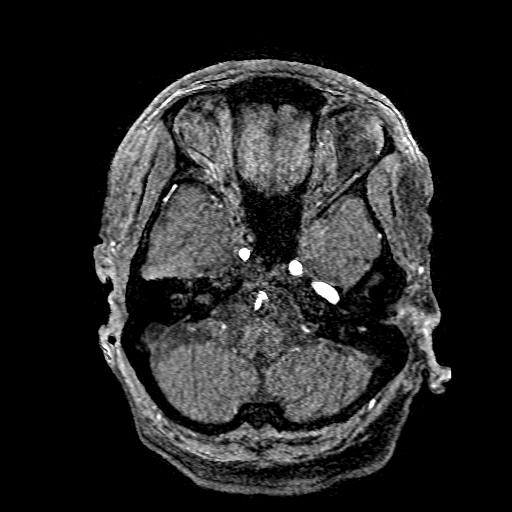
[im 20/184]
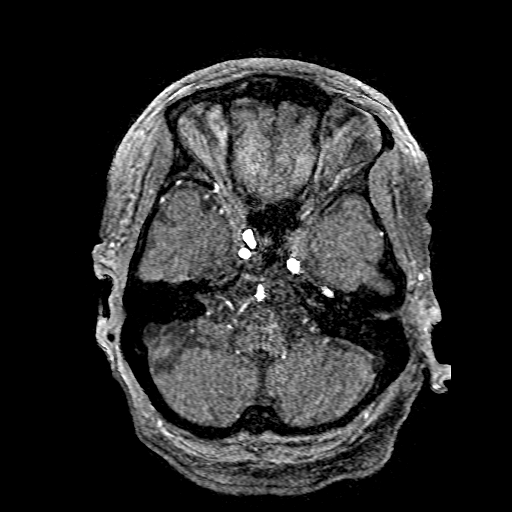
[im 24/184]
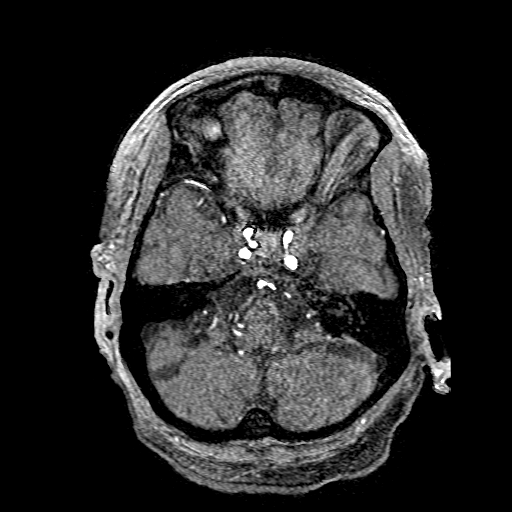
[im 28/184]
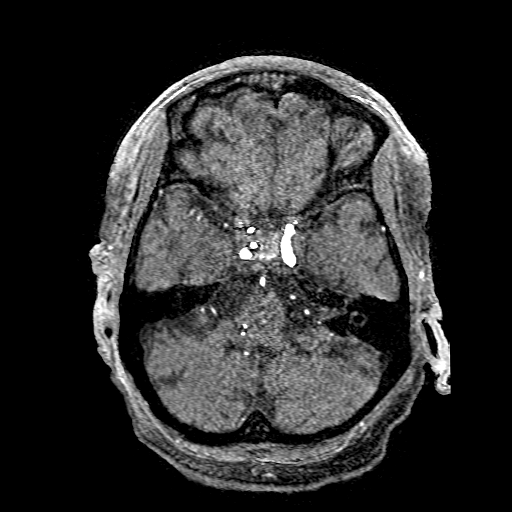
[im 32/184]
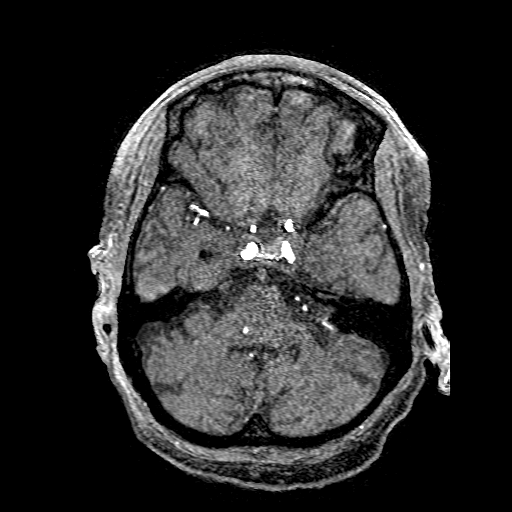
[im 36/184]
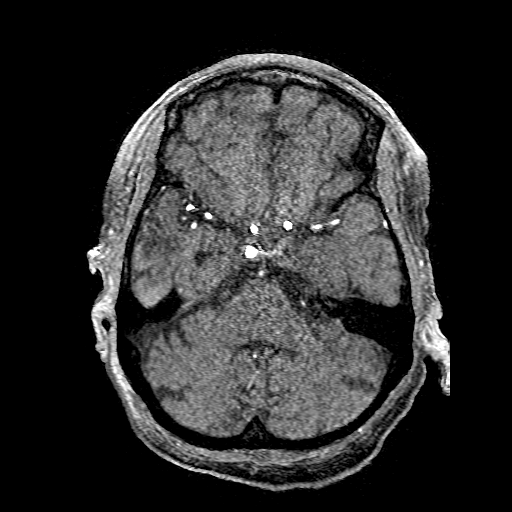
[im 39/184]
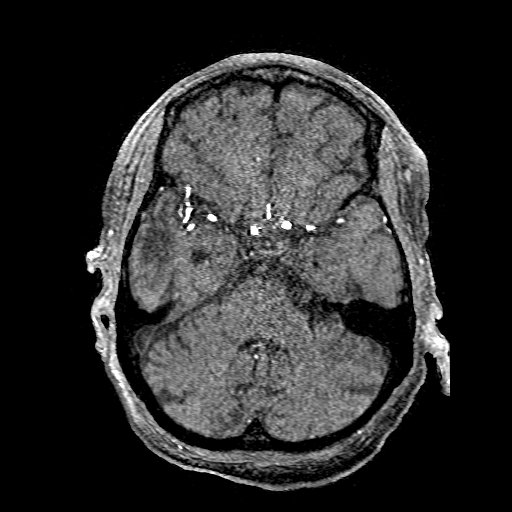
[im 59/184]
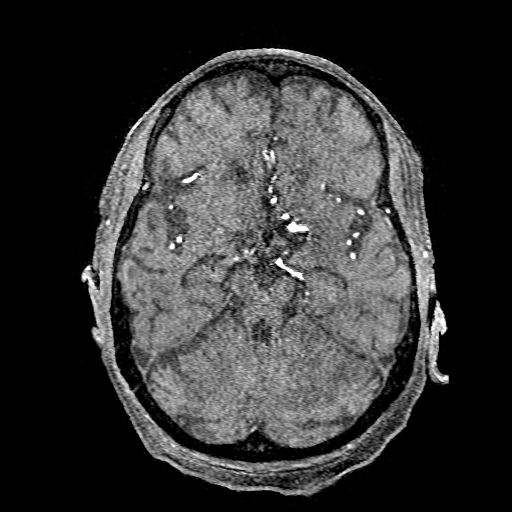
[im 82/184]
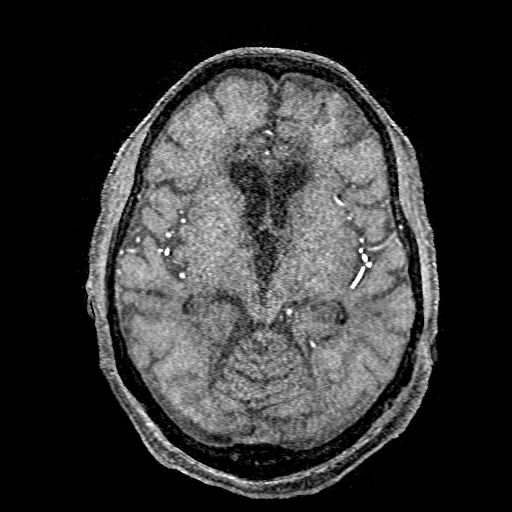
[im 94/184]
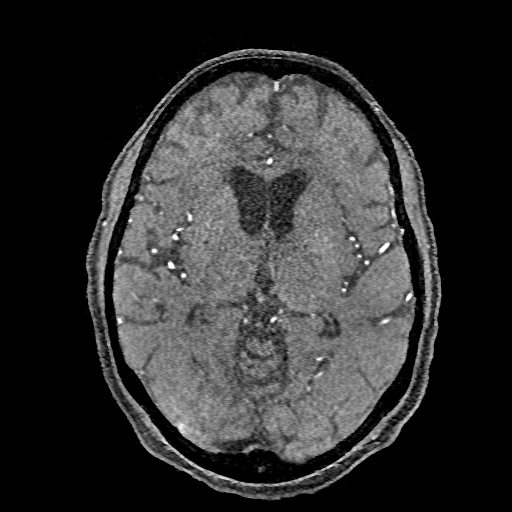
[im 106/184]
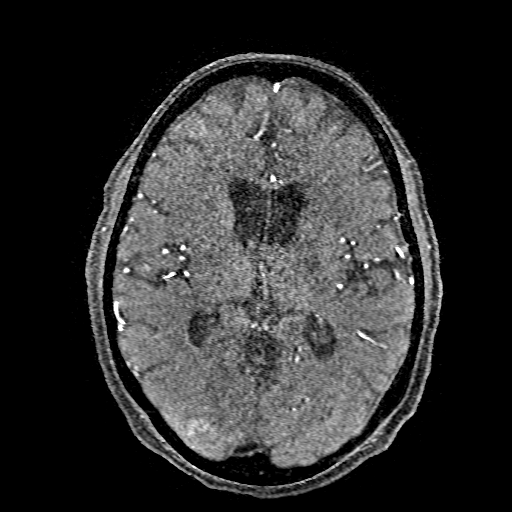
[im 129/184]
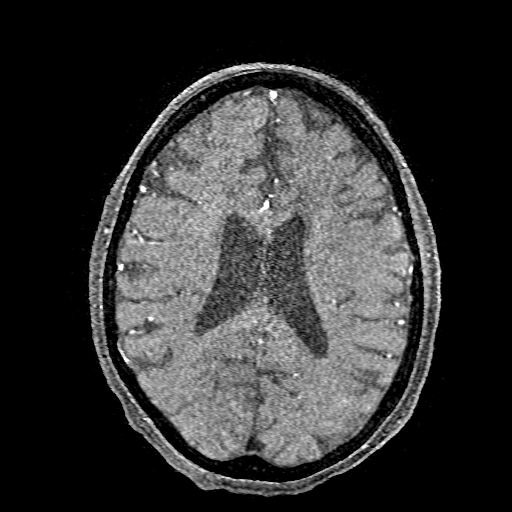
[im 152/184]
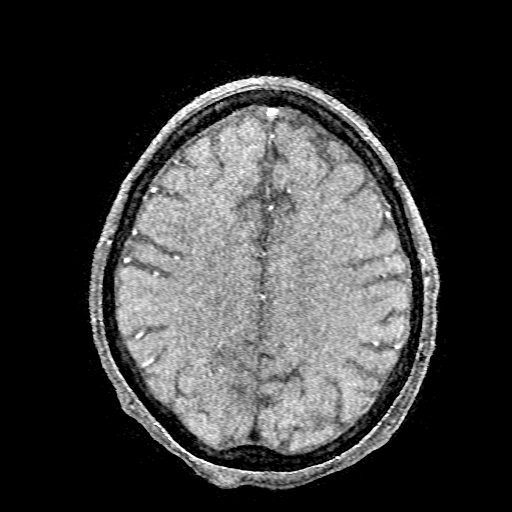
[im 156/184]
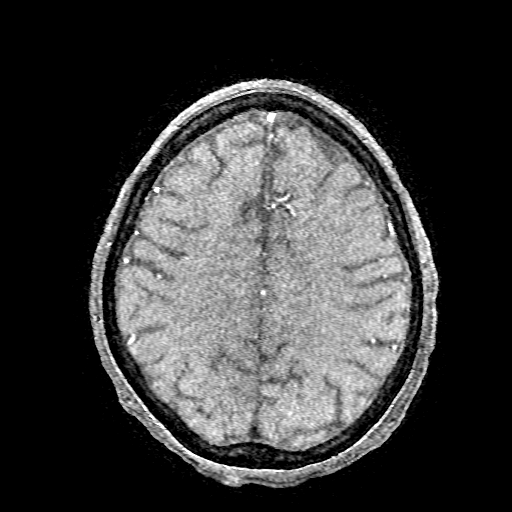
[im 176/184]
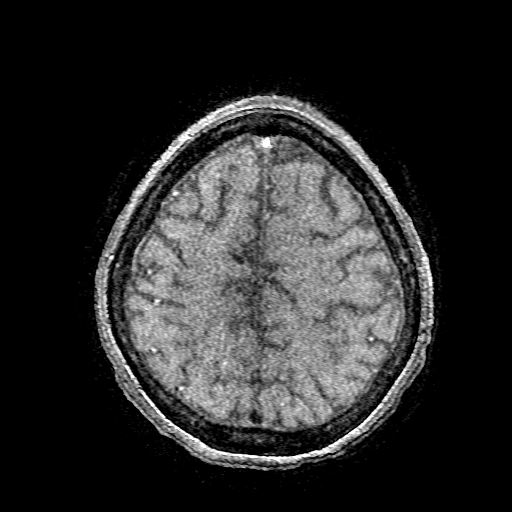

[19 of 48 positions shown; findings below may reference images not displayed]

FINDINGS: Antegrade flow in the distal vertebral arteries, but both distal
vertebral arteries are irregular. There is mild to moderate stenosis
of the distal left V4. Basilar artery occlusion at the mid basilar
artery level, just beyond patent AICA origins. No flow signal in the
distal basilar or basilar tip. There is a fetal type left PCA origin
with preserved left PCA flow signal, but absent bilateral SCA and
right PCA flow.

Antegrade flow in both ICA siphons. Both siphons are irregular
compatible with atherosclerosis, but there is only mild stenosis in
the supraclinoid right ICA. Patent carotid termini. Patent MCA and
ACA origins.

Dominant left ACA. Severe stenosis in the left A2 segment but
preserved distal left ACA flow signal. The right A1 *SCRATCHED* at
the right A2 is diminutive and/or demonstrates poor flow.

Left MCA M1 segment and bifurcation are patent. Visible left MCA
branches are patent but irregular. Right MCA M1 segment and
bifurcation are patent. Visible right MCA branches are patent but
irregular.
IMPRESSION: 1. Positive for Large Vessel Occlusion of the Basilar Artery.
Basilar clues and distal to the AICA origins concordant with the
infarct pattern on MRI today (fetal type left PCA origin preserves
that territory).
2. Superimposed moderate to severe intracranial atherosclerosis with
hemodynamically significant stenosis of the dominant left A2, and
bilateral MCA branch irregularity.

ADDENDUM:
Basilar Artery Occlusion was discussed by telephone with Neurology
Dr. Marieken at [DATE] on 02/09/2018.

*** End of Addendum ***

## 2021-04-09 ENCOUNTER — Encounter: Payer: Self-pay | Admitting: Hematology
